# Patient Record
Sex: Male | Born: 1970
Health system: Southern US, Community
[De-identification: ages and names within clinical notes are randomized; demographics above are authoritative.]

## PROBLEM LIST (undated history)

## (undated) DIAGNOSIS — K8689 Other specified diseases of pancreas: Secondary | ICD-10-CM

## (undated) DIAGNOSIS — Z1379 Encounter for other screening for genetic and chromosomal anomalies: Secondary | ICD-10-CM

## (undated) DIAGNOSIS — I81 Portal vein thrombosis: Secondary | ICD-10-CM

## (undated) DIAGNOSIS — C259 Malignant neoplasm of pancreas, unspecified: Secondary | ICD-10-CM

## (undated) DIAGNOSIS — J45909 Unspecified asthma, uncomplicated: Secondary | ICD-10-CM

## (undated) HISTORY — DX: Malignant neoplasm of pancreas, unspecified: C25.9

## (undated) HISTORY — DX: Other specified diseases of pancreas: K86.89

## (undated) HISTORY — DX: Encounter for other screening for genetic and chromosomal anomalies: Z13.79

---

## 2017-01-29 ENCOUNTER — Encounter: Payer: Self-pay | Admitting: Emergency Medicine

## 2017-01-29 ENCOUNTER — Emergency Department
Admission: EM | Admit: 2017-01-29 | Discharge: 2017-01-29 | Disposition: A | Discharge status: Home / Self Care | Payer: No Typology Code available for payment source | Attending: Emergency Medicine | Admitting: Emergency Medicine

## 2017-01-29 ENCOUNTER — Emergency Department: Payer: No Typology Code available for payment source

## 2017-01-29 DIAGNOSIS — R1013 Epigastric pain: Secondary | ICD-10-CM | POA: Diagnosis present

## 2017-01-29 DIAGNOSIS — K859 Acute pancreatitis without necrosis or infection, unspecified: Secondary | ICD-10-CM | POA: Insufficient documentation

## 2017-01-29 DIAGNOSIS — R112 Nausea with vomiting, unspecified: Secondary | ICD-10-CM | POA: Diagnosis not present

## 2017-01-29 DIAGNOSIS — R748 Abnormal levels of other serum enzymes: Secondary | ICD-10-CM | POA: Insufficient documentation

## 2017-01-29 LAB — URINALYSIS, COMPLETE (UACMP) WITH MICROSCOPIC
Bacteria, UA: NONE SEEN
Bilirubin Urine: NEGATIVE
GLUCOSE, UA: NEGATIVE mg/dL
KETONES UR: 5 mg/dL — AB
Leukocytes, UA: NEGATIVE
Nitrite: NEGATIVE
PH: 5 (ref 5.0–8.0)
Protein, ur: 30 mg/dL — AB
SPECIFIC GRAVITY, URINE: 1.026 (ref 1.005–1.030)

## 2017-01-29 LAB — CBC
HCT: 43.2 % (ref 40.0–52.0)
Hemoglobin: 15.4 g/dL (ref 13.0–18.0)
MCH: 31.3 pg (ref 26.0–34.0)
MCHC: 35.6 g/dL (ref 32.0–36.0)
MCV: 87.9 fL (ref 80.0–100.0)
PLATELETS: 312 10*3/uL (ref 150–440)
RBC: 4.92 MIL/uL (ref 4.40–5.90)
RDW: 13.2 % (ref 11.5–14.5)
WBC: 7 10*3/uL (ref 3.8–10.6)

## 2017-01-29 LAB — COMPREHENSIVE METABOLIC PANEL
ALK PHOS: 57 U/L (ref 38–126)
ALT: 16 U/L — AB (ref 17–63)
AST: 20 U/L (ref 15–41)
Albumin: 4.1 g/dL (ref 3.5–5.0)
Anion gap: 6 (ref 5–15)
BUN: 9 mg/dL (ref 6–20)
CALCIUM: 9.5 mg/dL (ref 8.9–10.3)
CHLORIDE: 105 mmol/L (ref 101–111)
CO2: 26 mmol/L (ref 22–32)
CREATININE: 0.86 mg/dL (ref 0.61–1.24)
GFR calc Af Amer: >60 mL/min (ref >60)
GFR calc non Af Amer: >60 mL/min (ref >60)
Glucose, Bld: 96 mg/dL (ref 65–99)
Potassium: 3.7 mmol/L (ref 3.5–5.1)
Sodium: 137 mmol/L (ref 135–145)
Total Bilirubin: 1.2 mg/dL (ref 0.3–1.2)
Total Protein: 8.1 g/dL (ref 6.5–8.1)

## 2017-01-29 LAB — LIPASE, BLOOD: LIPASE: 127 U/L — AB (ref 11–51)

## 2017-01-29 MED ORDER — DOCUSATE SODIUM 100 MG PO CAPS: ORAL_CAPSULE | ORAL | 0 refills | Status: DC

## 2017-01-29 MED ORDER — ONDANSETRON 4 MG PO TBDP
4.0000 mg | ORAL_TABLET | Freq: Once | ORAL | Status: AC
  Administered 2017-01-29: 4 mg via ORAL
  Filled 2017-01-29: qty 1

## 2017-01-29 MED ORDER — HYDROCODONE-ACETAMINOPHEN 5-325 MG PO TABS
2.0000 | ORAL_TABLET | Freq: Once | ORAL | Status: AC
  Administered 2017-01-29: 2 via ORAL
  Filled 2017-01-29: qty 2

## 2017-01-29 MED ORDER — ONDANSETRON 4 MG PO TBDP: ORAL_TABLET | ORAL | 0 refills | Status: DC

## 2017-01-29 MED ORDER — HYDROCODONE-ACETAMINOPHEN 5-325 MG PO TABS: 1.0000 | ORAL_TABLET | Freq: Four times a day (QID) | ORAL | 0 refills | Status: DC | PRN

## 2017-01-29 NOTE — Discharge Instructions (Signed)
You have been seen in the Emergency Department (ED) for abdominal pain and nausea/vomiting over the last few weeks.  Your ultrasound was normal, but your labs suggest you have been suffering from mild pancreatitis.  Please follow up as instructed above regarding today?s emergent visit and the symptoms that are bothering you.  Read through the information about food choices for pancreatitis and gallbladder disease - it is very important that you avoid fatty/greasy foods, spicy foods, and alcohol.  Take Norco as prescribed for severe pain. Do not drink alcohol, drive or participate in any other potentially dangerous activities while taking this medication as it may make you sleepy. Do not take this medication with any other sedating medications, either prescription or over-the-counter. If you were prescribed Percocet or Vicodin, do not take these with acetaminophen (Tylenol) as it is already contained within these medications.   This medication is an opiate (or narcotic) pain medication and can be habit forming.  Use it as little as possible to achieve adequate pain control.  Do not use or use it with extreme caution if you have a history of opiate abuse or dependence.  If you are on a pain contract with your primary care doctor or a pain specialist, be sure to let them know you were prescribed this medication today from the [facility] Emergency Department.  This medication is intended for your use only - do not give any to anyone else and keep it in a secure place where nobody else, especially children, have access to it.  It will also cause or worsen constipation, so you may want to consider taking an over-the-counter stool softener while you are taking this medication.  Return to the ED if your abdominal pain worsens or fails to improve, you develop bloody vomiting, bloody diarrhea, you are unable to tolerate fluids due to vomiting, fever greater than 101, or other symptoms that concern you.

## 2017-01-29 NOTE — ED Triage Notes (Addendum)
Patient to ER stating "I think I have food poisoning. I haven't been able to keep anything down for two weeks". Patient also c/o epigastric pain.  Patient ambulatory to stat desk without difficulty and in no acute distress.

## 2017-01-29 NOTE — ED Provider Notes (Addendum)
[facility] Emergency Department Provider Note  ____________________________________________   First MD Initiated Contact with Patient 01/29/17 1158     (approximate)  I have reviewed the triage vital signs and the nursing notes.   HISTORY  Chief Complaint Emesis; Nausea; and Diarrhea    HPI Nathaniel Saunders is a 46 y.o. male with no reported past medical history who presents for evaluation of 2-3 weeks of persistent pain in his epigastrium with associated nausea and vomiting.  He states that he wonders if he got food poisoning because he has difficulty every time he eats.  He is well-appearing and in no acute distress but states that he does have persistent pain and indicates the epigastric region.he thinks he was told in the past that he had gallstones but he never had surgery.  He is ambulatory without difficulty and came in today because he is tired of it hurting and tired of the vomiting.  He denies drinking any alcohol and states that it is not really matter what he eats.  He denies fever/chills, chest pain, shortness of breath.  Nothing in particular makes the patient's symptoms better but eating definitely makes it worse.   History reviewed. No pertinent past medical history.  There are no active problems to display for this patient.   History reviewed. No pertinent surgical history.  Prior to Admission medications   Medication Sig Start Date End Date Taking? Authorizing Provider  docusate sodium (COLACE) 100 MG capsule Take 1 tablet once or twice daily as needed for constipation while taking narcotic pain medicine 01/29/17   Hinda Kehr, MD  HYDROcodone-acetaminophen (NORCO/VICODIN) 5-325 MG tablet Take 1-2 tablets by mouth every 6 (six) hours as needed for moderate pain. 01/29/17   Hinda Kehr, MD  ondansetron (ZOFRAN ODT) 4 MG disintegrating tablet Allow 1-2 tablets to dissolve in your mouth every 8 hours as needed for nausea/vomiting 01/29/17    Hinda Kehr, MD    Allergies Patient has no known allergies.  History reviewed. No pertinent family history.  Social History Social History  Substance Use Topics  . Smoking status: Never Smoker  . Smokeless tobacco: Never Used  . Alcohol use No    Review of Systems Constitutional: No fever/chills Eyes: No visual changes. ENT: No sore throat. Cardiovascular: Denies chest pain. Respiratory: Denies shortness of breath. Gastrointestinal: persistent abdominal pain for several weeks with nausea and vomiting, worse after eating Genitourinary: Negative for dysuria. Musculoskeletal: Negative for neck pain.  Negative for back pain. Integumentary: Negative for rash. Neurological: Negative for headaches, focal weakness or numbness.   ____________________________________________   PHYSICAL EXAM:  VITAL SIGNS: ED Triage Vitals  Enc Vitals Group     BP 01/29/17 1023 111/71     Pulse Rate 01/29/17 1023 71     Resp 01/29/17 1023 18     Temp 01/29/17 1023 (!) 97.2 F (36.2 C)     Temp Source 01/29/17 1023 Oral     SpO2 01/29/17 1023 98 %     Weight 01/29/17 1024 81.6 kg (180 lb)     Height --      Head Circumference --      Peak Flow --      Pain Score 01/29/17 1023 8     Pain Loc --      Pain Edu? --      Excl. in [place]? --     Constitutional: Alert and oriented. Well appearing and in no acute distress. Eyes: Conjunctivae are normal.  Head:  Atraumatic. Neck: No stridor.  No meningeal signs.   Cardiovascular: Normal rate, regular rhythm. Good peripheral circulation. Grossly normal heart sounds. Respiratory: Normal respiratory effort.  No retractions. Lungs CTAB. Gastrointestinal: Soft with mild tenderness to palpation of the epigastrium, minimal tenderness to palpation of the right upper quadrant.  No lower abdominal tenderness.  No rebound and no guarding Musculoskeletal: No lower extremity tenderness nor edema. No gross deformities of extremities. Neurologic:  Normal  speech and language. No gross focal neurologic deficits are appreciated.  Skin:  Skin is warm, dry and intact. No rash noted. Psychiatric: Mood and affect are normal. Speech and behavior are normal.  ____________________________________________   LABS (all labs ordered are listed, but only abnormal results are displayed)  Labs Reviewed  LIPASE, BLOOD - Abnormal; Notable for the following:       Result Value   Lipase 127 (*)    All other components within normal limits  COMPREHENSIVE METABOLIC PANEL - Abnormal; Notable for the following:    ALT 16 (*)    All other components within normal limits  URINALYSIS, COMPLETE (UACMP) WITH MICROSCOPIC - Abnormal; Notable for the following:    Color, Urine AMBER (*)    APPearance CLEAR (*)    Hgb urine dipstick MODERATE (*)    Ketones, ur 5 (*)    Protein, ur 30 (*)    Squamous Epithelial / LPF 0-5 (*)    All other components within normal limits  CBC   ____________________________________________  EKG  ED ECG REPORT I, Tammee Thielke, the attending physician, personally viewed and interpreted this ECG.  Date: 01/29/2017 EKG Time: 10:33 AM Rate: 63 Rhythm: normal sinus rhythm QRS Axis: normal Intervals: normal ST/T Wave abnormalities: normal Narrative Interpretation: no evidence of acute ischemia  ____________________________________________  RADIOLOGY   US Abdomen Limited Ruq  Result Date: 01/29/2017 CLINICAL DATA:  Right upper quadrant abdominal pain for several weeks. EXAM: ULTRASOUND ABDOMEN LIMITED RIGHT UPPER QUADRANT COMPARISON:  None. FINDINGS: Gallbladder: No gallstones or wall thickening visualized. No sonographic Murphy sign noted by sonographer. Common bile duct: Diameter: 5.4 mm which is within normal limits. Liver: No focal lesion identified. Within normal limits in parenchymal echogenicity. Portal vein is patent on color Doppler imaging with normal direction of blood flow towards the liver. IMPRESSION: No  abnormality seen in the right upper quadrant of the abdomen. Electronically Signed   By: Marijo Conception, M.D.   On: 01/29/2017 13:40    ____________________________________________   PROCEDURES  Critical Care performed: No   Procedure(s) performed:   Procedures   ____________________________________________   INITIAL IMPRESSION / ASSESSMENT AND PLAN / ED COURSE  Pertinent labs & imaging results that were available during my care of the patient were reviewed by me and considered in my medical decision making (see chart for details).  the patient is very well-appearing and does not seem to be in distress although he does report some persistent abdominal discomfort.  His vital signs are all within normal limits.  His lab results are normal including no leukocytosis, except for a mildly elevated lipase.  As result of the lipase and the description of his symptoms, I obtain an ultrasound of his right upper quadrant, but the radiologist's report indicates no acute abnormalities.  I reassessed the patient and he seems happy and comfortable at this time.  I explained to him the results of the ultrasound and try to explain the lipase and the significance of pancreatitis.  I counseled him about food choices and  avoiding alcohol which he states he does not drink anyway.  I explained to him that we could admit him to the hospital but given the length of the symptoms, it may be worth trying to use dietary changes and pain and nausea medicine to improve his symptoms.  He agrees with that and states he would rather not come into the hospital.  I gave him strict return precautions if he gets worse rather than improves.  He understands and agrees with the plan.  Clinical Course as of Jan 29 1417  Sat Jan 29, 2017  1417 given the duration of his symptoms and the fact that he has stable vitals and no leukocytosis, I do not feel he would benefit from a CT scan of the abdomen and pelvis at this time.  I  suspect he has been suffering from mild subacute or chronic pancreatitis for an extended period of time.  [CF]    Clinical Course User Index [CF] Hinda Kehr, MD    ____________________________________________  FINAL CLINICAL IMPRESSION(S) / ED DIAGNOSES  Final diagnoses:  Acute pancreatitis, unspecified complication status, unspecified pancreatitis type  Elevated lipase     MEDICATIONS GIVEN DURING THIS VISIT:  Medications  HYDROcodone-acetaminophen (NORCO/VICODIN) 5-325 MG per tablet 2 tablet (not administered)  ondansetron (ZOFRAN-ODT) disintegrating tablet 4 mg (not administered)  ondansetron (ZOFRAN-ODT) disintegrating tablet 4 mg (4 mg Oral Given 01/29/17 1215)     NEW OUTPATIENT MEDICATIONS STARTED DURING THIS VISIT:  New Prescriptions   DOCUSATE SODIUM (COLACE) 100 MG CAPSULE    Take 1 tablet once or twice daily as needed for constipation while taking narcotic pain medicine   HYDROCODONE-ACETAMINOPHEN (NORCO/VICODIN) 5-325 MG TABLET    Take 1-2 tablets by mouth every 6 (six) hours as needed for moderate pain.   ONDANSETRON (ZOFRAN ODT) 4 MG DISINTEGRATING TABLET    Allow 1-2 tablets to dissolve in your mouth every 8 hours as needed for nausea/vomiting    Modified Medications   No medications on file    Discontinued Medications   No medications on file     Note:  This document was prepared using Dragon voice recognition software and may include unintentional dictation errors.    Hinda Kehr, MD 01/29/17 1418    Hinda Kehr, MD 01/29/17 [phone]

## 2017-01-29 NOTE — ED Triage Notes (Signed)
Pt to ed with c/o abd pain and n/v/d x 2 weeks.

## 2017-03-23 ENCOUNTER — Telehealth: Payer: Self-pay | Admitting: Internal Medicine

## 2017-03-23 ENCOUNTER — Emergency Department
Admission: EM | Admit: 2017-03-23 | Discharge: 2017-03-23 | Disposition: A | Discharge status: Home / Self Care | Payer: No Typology Code available for payment source | Attending: Emergency Medicine | Admitting: Emergency Medicine

## 2017-03-23 ENCOUNTER — Emergency Department: Payer: No Typology Code available for payment source

## 2017-03-23 ENCOUNTER — Encounter: Payer: Self-pay | Admitting: Emergency Medicine

## 2017-03-23 ENCOUNTER — Other Ambulatory Visit: Payer: Self-pay

## 2017-03-23 DIAGNOSIS — R1013 Epigastric pain: Secondary | ICD-10-CM | POA: Diagnosis present

## 2017-03-23 DIAGNOSIS — Z87891 Personal history of nicotine dependence: Secondary | ICD-10-CM | POA: Diagnosis not present

## 2017-03-23 DIAGNOSIS — C259 Malignant neoplasm of pancreas, unspecified: Secondary | ICD-10-CM | POA: Diagnosis not present

## 2017-03-23 LAB — URINALYSIS, COMPLETE (UACMP) WITH MICROSCOPIC
BILIRUBIN URINE: NEGATIVE
GLUCOSE, UA: NEGATIVE mg/dL
Ketones, ur: 20 mg/dL — AB
Leukocytes, UA: NEGATIVE
NITRITE: NEGATIVE
PH: 6 (ref 5.0–8.0)
Protein, ur: 30 mg/dL — AB
RBC / HPF: NONE SEEN RBC/hpf (ref 0–5)
SPECIFIC GRAVITY, URINE: 1.018 (ref 1.005–1.030)
Squamous Epithelial / LPF: NONE SEEN

## 2017-03-23 LAB — COMPREHENSIVE METABOLIC PANEL
ALK PHOS: 48 U/L (ref 38–126)
ALT: 13 U/L — ABNORMAL LOW (ref 17–63)
ANION GAP: 10 (ref 5–15)
AST: 22 U/L (ref 15–41)
Albumin: 4.2 g/dL (ref 3.5–5.0)
BILIRUBIN TOTAL: 1.3 mg/dL — AB (ref 0.3–1.2)
BUN: 8 mg/dL (ref 6–20)
CALCIUM: 9.5 mg/dL (ref 8.9–10.3)
CO2: 25 mmol/L (ref 22–32)
Chloride: 103 mmol/L (ref 101–111)
Creatinine, Ser: 1 mg/dL (ref 0.61–1.24)
GFR calc Af Amer: >60 mL/min (ref >60)
Glucose, Bld: 94 mg/dL (ref 65–99)
POTASSIUM: 3.6 mmol/L (ref 3.5–5.1)
Sodium: 138 mmol/L (ref 135–145)
TOTAL PROTEIN: 8.2 g/dL — AB (ref 6.5–8.1)

## 2017-03-23 LAB — CBC
HEMATOCRIT: 44.3 % (ref 40.0–52.0)
Hemoglobin: 14.9 g/dL (ref 13.0–18.0)
MCH: 30.2 pg (ref 26.0–34.0)
MCHC: 33.7 g/dL (ref 32.0–36.0)
MCV: 89.7 fL (ref 80.0–100.0)
PLATELETS: 276 10*3/uL (ref 150–440)
RBC: 4.93 MIL/uL (ref 4.40–5.90)
RDW: 13.3 % (ref 11.5–14.5)
WBC: 5.9 10*3/uL (ref 3.8–10.6)

## 2017-03-23 LAB — LIPASE, BLOOD: Lipase: 30 U/L (ref 11–51)

## 2017-03-23 LAB — TROPONIN I: Troponin I: <0.03 ng/mL (ref <0.03)

## 2017-03-23 MED ORDER — FAMOTIDINE IN NACL 20-0.9 MG/50ML-% IV SOLN
20.0000 mg | Freq: Once | INTRAVENOUS | Status: AC
  Administered 2017-03-23: 20 mg via INTRAVENOUS
  Filled 2017-03-23: qty 50

## 2017-03-23 MED ORDER — ACETAMINOPHEN 500 MG PO TABS
1000.0000 mg | ORAL_TABLET | Freq: Once | ORAL | Status: AC
  Administered 2017-03-23: 1000 mg via ORAL
  Filled 2017-03-23: qty 2

## 2017-03-23 MED ORDER — OXYCODONE-ACETAMINOPHEN 5-325 MG PO TABS
1.0000 | ORAL_TABLET | Freq: Four times a day (QID) | ORAL | 0 refills | Status: DC | PRN
Start: 2017-03-23 — End: 2017-03-24

## 2017-03-23 MED ORDER — ONDANSETRON HCL 4 MG/2ML IJ SOLN
4.0000 mg | Freq: Once | INTRAMUSCULAR | Status: AC
  Administered 2017-03-23: 4 mg via INTRAVENOUS
  Filled 2017-03-23: qty 2

## 2017-03-23 MED ORDER — IOPAMIDOL (ISOVUE-300) INJECTION 61%
100.0000 mL | Freq: Once | INTRAVENOUS | Status: AC | PRN
  Administered 2017-03-23: 100 mL via INTRAVENOUS

## 2017-03-23 NOTE — ED Notes (Addendum)
Epigastric pain after eating for the last month. Nausea. Denies vomiting and diarrhea. EDP at bedside. No pain at this time.  Pt alert and oriented X4, active, cooperative, pt in NAD. RR even and unlabored, color WNL.

## 2017-03-23 NOTE — Telephone Encounter (Signed)
Added for radiology review at tumor board 11/1

## 2017-03-23 NOTE — ED Notes (Signed)
ED Provider at bedside. 

## 2017-03-23 NOTE — Telephone Encounter (Signed)
Consultation for Pancreatic Mass. Ref from ER (per schd msg/Dr B) Per Dr Rogue Bussing, see MD on 03/24/17 @ 2:30 p.m. MF (added for CD)

## 2017-03-23 NOTE — ED Provider Notes (Signed)
[facility] Emergency Department Provider Note  ____________________________________________  Time seen: Approximately 10:59 AM  I have reviewed the triage vital signs and the nursing notes.   HISTORY  Chief Complaint Abdominal Pain   HPI Nathaniel Saunders is a 46 y.o. male with no significantpast medical history who presents for evaluation of abdominal pain. Patient reports that he has had this pain for 2 months. The pain is sharp, located in the epigastric region, usually occurs after he eats and lasts for about 5 hours. He reports that he feels a bad taste in his mouth during these 5 hours. Yesterday had a more severe episode which was associated with one episode of emesis. He went to [facility] emergency department however was unable to be seen due to the wait so then came here for further evaluation. Patient denies melena, hematemesis or coffee-ground emesis. At this time has 7 out of 10 pain. The pain is nonradiating. He denies fever or chills, he denies NSAID use, he denies alcohol use. No personal or family history of heart disease, he is not a smoker.  History reviewed. No pertinent past medical history.  There are no active problems to display for this patient.   History reviewed. No pertinent surgical history.  Prior to Admission medications   Medication Sig Start Date End Date Taking? Authorizing Provider  docusate sodium (COLACE) 100 MG capsule Take 1 tablet once or twice daily as needed for constipation while taking narcotic pain medicine 01/29/17   Hinda Kehr, MD  HYDROcodone-acetaminophen (NORCO/VICODIN) 5-325 MG tablet Take 1-2 tablets by mouth every 6 (six) hours as needed for moderate pain. 01/29/17   Hinda Kehr, MD  ondansetron (ZOFRAN ODT) 4 MG disintegrating tablet Allow 1-2 tablets to dissolve in your mouth every 8 hours as needed for nausea/vomiting 01/29/17   Hinda Kehr, MD  oxyCODONE-acetaminophen (ROXICET) 5-325 MG tablet Take 1  tablet by mouth every 6 (six) hours as needed. 03/23/17 03/23/18  Rudene Re, MD    Allergies Patient has no known allergies.  No family history on file.  Social History Social History  Substance Use Topics  . Smoking status: Former Research scientist (life sciences)  . Smokeless tobacco: Never Used  . Alcohol use No    Review of Systems  Constitutional: Negative for fever. Eyes: Negative for visual changes. ENT: Negative for sore throat. Neck: No neck pain  Cardiovascular: Negative for chest pain. Respiratory: Negative for shortness of breath. Gastrointestinal: + epigastric abdominal pain, nausea, and vomiting. No diarrhea. Genitourinary: Negative for dysuria. Musculoskeletal: Negative for back pain. Skin: Negative for rash. Neurological: Negative for headaches, weakness or numbness. Psych: No SI or HI  ____________________________________________   PHYSICAL EXAM:  VITAL SIGNS: ED Triage Vitals  Enc Vitals Group     BP 03/23/17 0920 125/76     Pulse Rate 03/23/17 0920 (!) 104     Resp 03/23/17 0920 18     Temp 03/23/17 0920 (!) 97.5 F (36.4 C)     Temp Source 03/23/17 0920 Oral     SpO2 03/23/17 0920 99 %     Weight 03/23/17 0921 147 lb (66.7 kg)     Height 03/23/17 0921 5\' 3"  (1.6 m)     Head Circumference --      Peak Flow --      Pain Score 03/23/17 0920 8     Pain Loc --      Pain Edu? --      Excl. in Pink? --     Constitutional:  Alert and oriented. Well appearing and in no apparent distress. HEENT:      Head: Normocephalic and atraumatic.         Eyes: Conjunctivae are normal. Sclera is non-icteric.       Mouth/Throat: Mucous membranes are moist.       Neck: Supple with no signs of meningismus. Cardiovascular: Regular rate and rhythm. No murmurs, gallops, or rubs. 2+ symmetrical distal pulses are present in all extremities. No JVD. Respiratory: Normal respiratory effort. Lungs are clear to auscultation bilaterally. No wheezes, crackles, or rhonchi.  Gastrointestinal:  Soft, ttp over the epigastric region and L quadrants, and non distended with positive bowel sounds. No rebound or guarding. Musculoskeletal: Nontender with normal range of motion in all extremities. No edema, cyanosis, or erythema of extremities. Neurologic: Normal speech and language. Face is symmetric. Moving all extremities. No gross focal neurologic deficits are appreciated. Skin: Skin is warm, dry and intact. No rash noted. Psychiatric: Mood and affect are normal. Speech and behavior are normal.  ____________________________________________   LABS (all labs ordered are listed, but only abnormal results are displayed)  Labs Reviewed  COMPREHENSIVE METABOLIC PANEL - Abnormal; Notable for the following:       Result Value   Total Protein 8.2 (*)    ALT 13 (*)    Total Bilirubin 1.3 (*)    All other components within normal limits  URINALYSIS, COMPLETE (UACMP) WITH MICROSCOPIC - Abnormal; Notable for the following:    Color, Urine YELLOW (*)    APPearance CLEAR (*)    Hgb urine dipstick MODERATE (*)    Ketones, ur 20 (*)    Protein, ur 30 (*)    Bacteria, UA RARE (*)    All other components within normal limits  URINE CULTURE  LIPASE, BLOOD  CBC  TROPONIN I   ____________________________________________  EKG  ED ECG REPORT I, Rudene Re, the attending physician, personally viewed and interpreted this ECG.  Normal sinus rhythm, rate of 16, normal intervals, normal axis, LVH, no ST elevation or depression. unchanged from prior from a 01/2017 ____________________________________________  RADIOLOGY  CT a/p: 2.6 cm low-attenuation mass in the pancreatic neck causing pancreatic ductal dilatation and encasing and narrowing the proximal main portal vein, highly suspicious for pancreatic adenocarcinoma.  Smaller 1.5 cm .ow-attenuation lesion in the pancreatic tail measuring 1.5 cm, which may also be due pancreatic neoplasm.  No evidence of metastatic disease within the  abdomen or pelvis.  Incidental findings which include mildly enlarged prostate, and aortic atherosclerosis. ____________________________________________   PROCEDURES  Procedure(s) performed: None Procedures Critical Care performed:  None ____________________________________________   INITIAL IMPRESSION / ASSESSMENT AND PLAN / ED COURSE  46 y.o. male with no significantpast medical history who presents for evaluation of post prandial abdominal pain x 2 months. patient was seen here a month ago with similar complaint and had slightly elevated lipase with a right upper quadrant ultrasound showing no abnormalities in the gallbladder and no stones. Patient continues to have pain and is currently tender to palpation on the epigastric and left quadrants with no rebound or guarding. He is otherwise well appearing. Differential diagnosis including gastritis versus peptic ulcer disease versus GERD versus diverticulitis. Labs at this time show normal CBC, CMP and lipase. Will get CT a/p, will give pepcid, zofran, and IVF for symptom relief. If workup negative will start patient on pepcid and refer to GI    _________________________ 1:00 PM on 03/23/2017 -----------------------------------------  unfortunately patient's CT scan is concerning for  pancreatic cancer. Patient tells me that he has a very strong family history of pancreatic cancer. I have spoken with Dr. Rogue Bussing who will ensure close outpatient follow-up. Patient be provided with a prescription for pain medicine. Discussed return precautions with patient   As part of my medical decision making, I reviewed the following data within the [place] notes reviewed and incorporated, Labs reviewed , EKG interpreted , Old EKG reviewed, Old chart reviewed, Radiograph reviewed , A consult was requested and obtained from this/these consultant(s) Oncology, Notes from prior ED visits and [place] Controlled Substance  Database    Pertinent labs & imaging results that were available during my care of the patient were reviewed by me and considered in my medical decision making (see chart for details).    ____________________________________________   FINAL CLINICAL IMPRESSION(S) / ED DIAGNOSES  Final diagnoses:  Malignant neoplasm of pancreas, unspecified location of malignancy ([place])      NEW MEDICATIONS STARTED DURING THIS VISIT:  New Prescriptions   OXYCODONE-ACETAMINOPHEN (ROXICET) 5-325 MG TABLET    Take 1 tablet by mouth every 6 (six) hours as needed.     Note:  This document was prepared using Dragon voice recognition software and may include unintentional dictation errors.    Rudene Re, MD 03/23/17 [phone]

## 2017-03-23 NOTE — ED Triage Notes (Signed)
Epigastric pain after eating x 2 months.

## 2017-03-23 NOTE — ED Notes (Signed)
Patient transported to CT 

## 2017-03-23 NOTE — Telephone Encounter (Signed)
Got a call from ER Doc re: pancreatic mass.  Will see pt tomorrow Nov 1st at 2:30 pm. Please schedule.  kristi- please add pts CT scan to tumor conference for tomorrow.

## 2017-03-23 NOTE — ED Notes (Signed)
Pt alert and oriented X4, active, cooperative, pt in NAD. RR even and unlabored, color WNL.  Pt informed to return if any life threatening symptoms occur.  Discharge and followup instructions reviewed.  

## 2017-03-24 ENCOUNTER — Telehealth: Payer: Self-pay | Admitting: Internal Medicine

## 2017-03-24 ENCOUNTER — Other Ambulatory Visit: Payer: Self-pay

## 2017-03-24 ENCOUNTER — Inpatient Hospital Stay: Payer: No Typology Code available for payment source

## 2017-03-24 ENCOUNTER — Inpatient Hospital Stay: Payer: No Typology Code available for payment source | Attending: Internal Medicine | Admitting: Internal Medicine

## 2017-03-24 ENCOUNTER — Encounter: Payer: Self-pay | Admitting: Internal Medicine

## 2017-03-24 DIAGNOSIS — J45909 Unspecified asthma, uncomplicated: Secondary | ICD-10-CM | POA: Insufficient documentation

## 2017-03-24 DIAGNOSIS — C251 Malignant neoplasm of body of pancreas: Secondary | ICD-10-CM | POA: Diagnosis not present

## 2017-03-24 DIAGNOSIS — R112 Nausea with vomiting, unspecified: Secondary | ICD-10-CM | POA: Diagnosis not present

## 2017-03-24 DIAGNOSIS — Z79899 Other long term (current) drug therapy: Secondary | ICD-10-CM | POA: Diagnosis not present

## 2017-03-24 DIAGNOSIS — I700 Atherosclerosis of aorta: Secondary | ICD-10-CM | POA: Diagnosis not present

## 2017-03-24 DIAGNOSIS — G893 Neoplasm related pain (acute) (chronic): Secondary | ICD-10-CM | POA: Diagnosis not present

## 2017-03-24 DIAGNOSIS — R634 Abnormal weight loss: Secondary | ICD-10-CM | POA: Insufficient documentation

## 2017-03-24 DIAGNOSIS — N400 Enlarged prostate without lower urinary tract symptoms: Secondary | ICD-10-CM

## 2017-03-24 DIAGNOSIS — Z87891 Personal history of nicotine dependence: Secondary | ICD-10-CM | POA: Insufficient documentation

## 2017-03-24 DIAGNOSIS — Z800 Family history of malignant neoplasm of digestive organs: Secondary | ICD-10-CM | POA: Diagnosis not present

## 2017-03-24 LAB — PROTIME-INR
INR: 1
Prothrombin Time: 13.1 seconds (ref 11.4–15.2)

## 2017-03-24 LAB — C-REACTIVE PROTEIN: CRP: <0.8 mg/dL (ref <1.0)

## 2017-03-24 LAB — HEPATIC FUNCTION PANEL
ALK PHOS: 54 U/L (ref 38–126)
ALT: 13 U/L — AB (ref 17–63)
AST: 21 U/L (ref 15–41)
Albumin: 4.2 g/dL (ref 3.5–5.0)
BILIRUBIN INDIRECT: 0.5 mg/dL (ref 0.3–0.9)
BILIRUBIN TOTAL: 0.6 mg/dL (ref 0.3–1.2)
Bilirubin, Direct: 0.1 mg/dL (ref 0.1–0.5)
TOTAL PROTEIN: 8 g/dL (ref 6.5–8.1)

## 2017-03-24 LAB — URINE CULTURE: Culture: NO GROWTH

## 2017-03-24 LAB — APTT: aPTT: 31 seconds (ref 24–36)

## 2017-03-24 MED ORDER — OXYCODONE-ACETAMINOPHEN 5-325 MG PO TABS: ORAL_TABLET | ORAL | 0 refills | Status: DC

## 2017-03-24 MED ORDER — FENTANYL 25 MCG/HR TD PT72: 25.0000 ug | MEDICATED_PATCH | TRANSDERMAL | 0 refills | Status: DC

## 2017-03-24 NOTE — Progress Notes (Signed)
Ref by ED for work up for pancreatic mass. Patient reports abd pain (LUQ/epigastric pain that radiates to mid back"/ fullness/bloating and intermittent episodes of nausea. Pt reports 40+ lb unintentional wt loss x 2-3 months. He eats 3 meals/day but "often feels fullness after eating." Patient exercises and eats healthy meals. He works as a Database administrator. He has a good social support and lives with girlfriend "Atachia." He states that both his mother & mat. grandmother died of pancreatic cancer.

## 2017-03-24 NOTE — Telephone Encounter (Signed)
Spoke to pt at [phone]/ cell, he agrees with me speaking to his employer- regarding his possible diagnosis and is current treatment plan.    Received a call from patient's employer [phone]; [employer] [place]. Inform the patient's possible diagnosis/need for narcotics.

## 2017-03-24 NOTE — Progress Notes (Signed)
  Oncology Nurse Navigator Documentation Met with Nathaniel Saunders before and after consult with Dr. Rogue Bussing. Introduced Therapist, nutritional and provided contact information for any future needs. EUS arranged for 11/8 with Dr. Cephas Darby at [facility] for biopsy. Went over instructions and copy given to him along with further education regarding EUS. CT pancreatic protocol scheduled for 11/9 at [facility]. Reviewed location and prep for exam. He will follow up with Dr. Rogue Bussing for results in [facility] Tuesday 11/13. This appt also reviewed with him. Labs drawn today. He was encouraged to call with any questions regarding plan of care and appointments.  INSTRUCTIONS FOR ENDOSCOPIC ULTRASOUND -Your procedure has been scheduled for November 8th with Dr. Cephas Darby at [facility]. -The hospital may contact you to pre-register over the phone.  -To get your scheduled arrival time, please call the Endoscopy unit at  [phone] between 1-3 p.m. on:  October 7th   -ON THE DAY OF YOU PROCEDURE:   1. If you are scheduled for a morning procedure, nothing to drink after midnight  -If you are scheduled for an afternoon procedure, you may have clear liquids until 5 hours prior  to the procedure but no carbonated drinks or broth  2. NO FOOD THE DAY OF YOUR PROCEDURE  3. You may take your heart, seizure, blood pressure, Parkinson's or breathing medications at  6am with just enough water to get your pills down  4. Do not take any oral Diabetic medications the morning of your procedure.  5. If you are a diabetic and are using insulin, please notify your prescribing physician of this  procedure as your dose may need to be altered related to not being able to eat or drink.   5. Do not take vitamins, iron, or fish oil for 5 days before your procedure     -On the day of your procedure, come to the [facility] Admitting/Registration desk (First desk on the right) at the scheduled arrival time. You MUST  have someone drive you home from your procedure. You must have a responsible adult with a valid driver's license who is on site throughout your entire procedure and who can stay with you for several hours after your procedure. You may not go home alone in a taxi, shuttle [place] or bus, as the drivers will not be responsible for you.  --If you have any questions please call me at the above contact    Navigator Location: [facility] Onc (03/24/17 1600)   )Navigator Encounter Type: Initial MedOnc (03/24/17 1600)   Abnormal Finding Date: 03/23/17 (03/24/17 1600)                 Patient Visit Type: MedOnc;Initial (03/24/17 1600) Treatment Phase: Abnormal Scans (03/24/17 1600) Barriers/Navigation Needs: Coordination of Care;Education (03/24/17 1600)   Interventions: Education;Coordination of Care (03/24/17 1600)   Coordination of Care: EUS (03/24/17 1600) Education Method: Teach-back;Verbal;Written (03/24/17 1600)      Acuity: Level 2 (03/24/17 1600)   Acuity Level 2: Initial guidance, education and coordination as needed;Educational needs;Assistance expediting appointments;Ongoing guidance and education throughout treatment as needed (03/24/17 1600)     Time Spent with Patient: 60 (03/24/17 1600)

## 2017-03-24 NOTE — Progress Notes (Signed)
[place] CONSULT NOTE  Patient Care Team: Patient, No Pcp Per as PCP - General (General Practice) Clent Jacks, RN as Registered Nurse  CHIEF COMPLAINTS/PURPOSE OF CONSULTATION: Pancreatic mass  #  Oncology History   # November 2018- pancreatic neck mass [~2.5cm]; smaller pancreatic tail mass.    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  male with no significant medical history except for mild asthma- is currently being evaluated for abdominal pain/pancreatic mass.  Patient states that she has been having ongoing worsening abdominal pain in the epigastric region- for the last 2 months. This gets worse with eating. It radiates to the back. At worst- he states his pain is 9 as scale of 10. He has lost about 40 pounds. He has couple of episodes of nausea vomiting.    Patient was recently evaluated in the emergency room- that showed a 2.5 cm pancreatic neck mass; smaller pancreatic tail mass. The pancreatic mass seems to involve the venous vasculature. LFTs within normal limits.  Patient does have a family history of pancreatic cancer- mother/maternal grandmother. No history of breast cancer ovarian cancer.  ROS: A complete 10 point review of system is done which is negative except mentioned above in history of present illness  MEDICAL HISTORY:  Asthma [well controlled] Past Medical History:  Diagnosis Date  . Pancreatic mass     SURGICAL HISTORY: History reviewed. No pertinent surgical history.  SOCIAL HISTORY:  he lives in [place]; sister/girl friend; he has children aged  18/18/ 6 years;  quit smkoing - 5 months; alcohol- none; truck driver. Social History   Socioeconomic History  . Marital status: Single    Spouse name: Not on file  . Number of children: Not on  file  . Years of education: Not on file  . Highest education level: Not on file  Social Needs  . Financial resource strain: Not on file  . Food insecurity - worry: Not on file  . Food insecurity - inability: Not on file  . Transportation needs - medical: Not on file  . Transportation needs - non-medical: Not on file  Occupational History  . Not on file  Tobacco Use  . Smoking status: Former Smoker    Packs/day: 0.25    Years: 5.00    Pack years: 1.25    Types: Cigarettes  . Smokeless tobacco: Never Used  Substance and Sexual Activity  . Alcohol use: No  . Drug use: No  . Sexual activity: Not on file  Other Topics Concern  . Not on file  Social History Narrative  . Not on file    FAMILY HISTORY: Family History  Problem Relation Age of Onset  . Pancreatic cancer Mother 48  . Pancreatic cancer Maternal Grandmother 60    ALLERGIES:  has No Known Allergies.  MEDICATIONS:  Current Outpatient Medications  Medication Sig Dispense Refill  . docusate sodium (COLACE) 100 MG capsule Take 1 tablet once or twice daily as needed for constipation while taking narcotic pain medicine 30 capsule 0  . ondansetron (ZOFRAN ODT) 4 MG disintegrating tablet Allow 1-2 tablets to dissolve in your mouth every 8 hours as needed for nausea/vomiting 30 tablet 0  . oxyCODONE-acetaminophen (ROXICET) 5-325 MG tablet 6- 8 hours  as needed for pain 45 tablet 0  . fentaNYL (DURAGESIC - DOSED MCG/HR) 25 MCG/HR patch Place 1 patch (25 mcg total) onto the skin every 3 (three) days. 5 patch 0   No current facility-administered medications for this visit.       Marland Kitchen  PHYSICAL EXAMINATION: ECOG PERFORMANCE STATUS: 1 - Symptomatic but completely ambulatory  Vitals:   03/24/17 1447  BP: 137/80  Pulse: 70  Resp: 20  Temp: 97.8 F (36.6 C)   Filed Weights   03/24/17 1447  Weight: 144 lb (65.3 kg)    GENERAL: Well-nourished well-developed; Alert, no distress and comfortable.   Alone. EYES: no pallor  or icterus OROPHARYNX: no thrush or ulceration; poor dentition  NECK: supple, no masses felt LYMPH:  no palpable lymphadenopathy in the cervical, axillary or inguinal regions LUNGS: clear to auscultation and  No wheeze or crackles HEART/CVS: regular rate & rhythm and no murmurs; No lower extremity edema ABDOMEN: abdomen soft, non-tender and normal bowel sounds Musculoskeletal:no cyanosis of digits and no clubbing  PSYCH: alert & oriented x 3 with fluent speech NEURO: no focal motor/sensory deficits SKIN:  no rashes or significant lesions  LABORATORY DATA:  I have reviewed the data as listed Lab Results  Component Value Date   WBC 5.9 03/23/2017   HGB 14.9 03/23/2017   HCT 44.3 03/23/2017   MCV 89.7 03/23/2017   PLT 276 03/23/2017   Recent Labs    01/29/17 1025 03/23/17 0919 03/24/17 1537  NA 137 138  --   K 3.7 3.6  --   CL 105 103  --   CO2 26 25  --   GLUCOSE 96 94  --   BUN 9 8  --   CREATININE 0.86 1.00  --   CALCIUM 9.5 9.5  --   GFRNONAA >60 >60  --   GFRAA >60 >60  --   PROT 8.1 8.2* 8.0  ALBUMIN 4.1 4.2 4.2  AST 20 22 21   ALT 16* 13* 13*  ALKPHOS 57 48 54  BILITOT 1.2 1.3* 0.6  BILIDIR  --   --  0.1  IBILI  --   --  0.5    RADIOGRAPHIC STUDIES: I have personally reviewed the radiological images as listed and agreed with the findings in the report. Ct Abdomen Pelvis W Contrast  Result Date: 03/23/2017 CLINICAL DATA:  Postprandial epigastric and left-sided abdominal pain for 1 month. Nausea. EXAM: CT ABDOMEN AND PELVIS WITH CONTRAST TECHNIQUE: Multidetector CT imaging of the abdomen and pelvis was performed using the standard protocol following bolus administration of intravenous contrast. CONTRAST:  133mL ISOVUE-300 IOPAMIDOL (ISOVUE-300) INJECTION 61% COMPARISON:  None. FINDINGS: Lower Chest: No acute findings. Pleural-parenchymal scarring in both lung bases. Hepatobiliary: No hepatic masses identified. Gallbladder is unremarkable. Pancreas: Pancreatic  ductal dilatation is seen in the body and tail. A focal ill-defined low-attenuation lesion is seen in the pancreatic neck at site of pancreatic duct stricture which measures 2.6 x 2.0 cm on image 22/2. This encases and narrows the proximal main portal vein common is highly suspicious for carcinoma. A second smaller ill-defined low-attenuation area seen in the pancreatic tail measuring 1.5 cm on image 16/2, which may also represent pancreatic neoplasm. Spleen: Within normal limits in size and appearance. Adrenals/Urinary Tract: No masses identified. Small right renal cyst noted. No evidence of hydronephrosis. Mild diffuse wall thickening of urinary bladder may be due to chronic bladder outlet obstruction or cystitis. Stomach/Bowel: No evidence of obstruction, inflammatory process or abnormal  fluid collections. Vascular/Lymphatic: No pathologically enlarged lymph nodes. No abdominal aortic aneurysm. Aortic atherosclerosis. Reproductive:  Mildly enlarged prostate. Other:  None. Musculoskeletal:  No suspicious bone lesions identified. IMPRESSION: 2.6 cm low-attenuation mass in the pancreatic neck causing pancreatic ductal dilatation and encasing and narrowing the proximal main portal vein, highly suspicious for pancreatic adenocarcinoma. Smaller 1.5 cm .ow-attenuation lesion in the pancreatic tail measuring 1.5 cm, which may also be due pancreatic neoplasm. No evidence of metastatic disease within the abdomen or pelvis. Incidental findings which include mildly enlarged prostate, and aortic atherosclerosis. Electronically Signed   By: Earle Gell M.D.   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 No evidence of lymphadenopathy or distant metastatic disease. Imaging reviewed at the tumor conference; recommended pancreas protocol CT scan for further evaluation of the vasculature. Recommend  EUS/ for biopsy and staging purposes.  # If in the absence of any distant metastatic disease- given the high possibility of borderline resectable pancreatic cancer [based on portal vein involvement; awaiting further imaging; EUS]- I would recommend aggressive approach with FOLFIRINOX chemotherapy; with evaluation for surgical resection.  # Genetic testing- discussed regarding genetic testing given the diagnosis of pancreatic cancer in mother; grandmother the patient's young age. He is interested.  # Pain control- poorly controlled. Recommend fentanyl patch 25 g and also Percocet. New prescriptions skin. Discussed the potential side effects-including nausea; constipation dizziness; issues with the driving.  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  # Patient will follow-up with me in few days after the biopsy/CT scan. Today with LFTs and CA-19-9. PT/PTT.  Thank you for allowing me to participate in the care of your pleasant patient. Please do not hesitate to contact me with questions or concerns in the interim.Discussed with Nurse Navigator, Mathis Fare.    # I reviewed the blood work- with the patient in detail; also reviewed the imaging independently [as summarized above]; and with the patient in detail.   All questions were answered. The patient knows to call the clinic with any problems, questions or concerns.    Cammie Sickle, MD 03/27/2017 11:51 AM

## 2017-03-27 NOTE — Assessment & Plan Note (Signed)
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 No evidence of lymphadenopathy or distant metastatic disease. Imaging reviewed at the tumor conference; recommended pancreas protocol CT scan for further evaluation of the vasculature. Recommend EUS/ for biopsy and staging purposes.  # If in the absence of any distant metastatic disease- given the high possibility of borderline resectable pancreatic cancer [based on portal vein involvement; awaiting further imaging; EUS]- I would recommend aggressive approach with FOLFIRINOX chemotherapy; with evaluation for surgical resection.  # Genetic testing- discussed regarding genetic testing given the diagnosis of pancreatic cancer in mother; grandmother the patient's young age. He is interested.  # Pain control- poorly controlled. Recommend fentanyl patch 25 g and also Percocet. New prescriptions skin. Discussed the potential side effects-including nausea; constipation dizziness; issues with the driving.  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  # Patient will follow-up with me in few days after the biopsy/CT scan. Today with LFTs and CA-19-9. PT/PTT.  Thank you for allowing me to participate in the care of your pleasant patient. Please do not hesitate to contact me with questions or concerns in the interim.Discussed with Nurse Navigator, Mathis Fare.

## 2017-03-28 ENCOUNTER — Other Ambulatory Visit: Payer: Self-pay

## 2017-03-30 ENCOUNTER — Encounter: Payer: Self-pay | Admitting: *Deleted

## 2017-03-31 ENCOUNTER — Telehealth: Payer: Self-pay

## 2017-03-31 ENCOUNTER — Ambulatory Visit: Admission: RE | Admit: 2017-03-31 | Payer: No Typology Code available for payment source | Source: Ambulatory Visit

## 2017-03-31 ENCOUNTER — Other Ambulatory Visit: Payer: Self-pay | Admitting: Gastroenterology

## 2017-03-31 ENCOUNTER — Inpatient Hospital Stay: Payer: No Typology Code available for payment source

## 2017-03-31 ENCOUNTER — Encounter: Admission: RE | Payer: Self-pay | Source: Ambulatory Visit

## 2017-03-31 HISTORY — DX: Unspecified asthma, uncomplicated: J45.909

## 2017-03-31 SURGERY — ULTRASOUND, UPPER GI TRACT, ENDOSCOPIC
Anesthesia: Choice

## 2017-03-31 MED ORDER — PROPOFOL 500 MG/50ML IV EMUL
INTRAVENOUS | Status: AC
  Filled 2017-03-31: qty 50

## 2017-03-31 MED ORDER — LIDOCAINE HCL (PF) 2 % IJ SOLN
INTRAMUSCULAR | Status: AC
  Filled 2017-03-31: qty 10

## 2017-03-31 MED ORDER — FENTANYL CITRATE (PF) 100 MCG/2ML IJ SOLN
INTRAMUSCULAR | Status: AC
  Filled 2017-03-31: qty 2

## 2017-03-31 MED ORDER — MIDAZOLAM HCL 2 MG/2ML IJ SOLN
INTRAMUSCULAR | Status: AC
  Filled 2017-03-31: qty 2

## 2017-03-31 NOTE — Telephone Encounter (Signed)
  Oncology Nurse Navigator Documentation Mr. Denz did not show for his EUS today. Dr. Cephas Darby can work him in one day next week at [facility]. I contacted Mr. Lambert who stated he got his appointments mixed and he thought is was tomorrow. He does have his Ct scheduled for tomorrow. We reviewed this appointment. He is agreeable to have EUS at [facility] next week. I have sent the information to Dr. Cephas Darby and his scheduling team. Mr. Talcott is aware that [facility] will be contacting him to arrange. Navigator Location: [facility] (03/31/17 1600)   )Navigator Encounter Type: Telephone (03/31/17 1600) Telephone: [place] Call (03/31/17 1600)                               Coordination of Care: EUS (03/31/17 1600)                  Time Spent with Patient: 15 (03/31/17 1600)

## 2017-04-01 ENCOUNTER — Ambulatory Visit
Admission: RE | Admit: 2017-04-01 | Discharge: 2017-04-01 | Disposition: A | Discharge status: Home / Self Care | Payer: No Typology Code available for payment source | Source: Ambulatory Visit | Attending: Internal Medicine | Admitting: Internal Medicine

## 2017-04-01 ENCOUNTER — Telehealth: Payer: Self-pay | Admitting: Internal Medicine

## 2017-04-01 ENCOUNTER — Telehealth: Payer: Self-pay

## 2017-04-01 DIAGNOSIS — K838 Other specified diseases of biliary tract: Secondary | ICD-10-CM | POA: Diagnosis not present

## 2017-04-01 DIAGNOSIS — I700 Atherosclerosis of aorta: Secondary | ICD-10-CM | POA: Diagnosis not present

## 2017-04-01 DIAGNOSIS — C251 Malignant neoplasm of body of pancreas: Secondary | ICD-10-CM | POA: Insufficient documentation

## 2017-04-01 MED ORDER — IOPAMIDOL (ISOVUE-370) INJECTION 76%
100.0000 mL | Freq: Once | INTRAVENOUS | Status: AC | PRN
  Administered 2017-04-01: 100 mL via INTRAVENOUS

## 2017-04-01 NOTE — Telephone Encounter (Signed)
Spoke to Dr.Dew, kindly agrees to get the pt in for port placement next week. [pt awaiting EUS biopsy at [facility] on 11/14th]. Discussed with Mathis Fare.   H/B- please send a referral for Dr.Dew.

## 2017-04-01 NOTE — Telephone Encounter (Signed)
  Oncology Nurse Navigator Documentation Voicemail left with Nathaniel Saunders to return call. We need to arrange port a cath and chemo class asap for him. He has EUS scheduled for 11/14 at [facility] with Dr. Mont Dutton. Navigator Location: [facility] (04/01/17 1500)   )Navigator Encounter Type: Telephone (04/01/17 1500)                                                    Time Spent with Patient: 15 (04/01/17 1500)

## 2017-04-04 ENCOUNTER — Other Ambulatory Visit: Payer: Self-pay | Admitting: Internal Medicine

## 2017-04-04 ENCOUNTER — Telehealth (INDEPENDENT_AMBULATORY_CARE_PROVIDER_SITE_OTHER): Payer: Self-pay

## 2017-04-04 DIAGNOSIS — Z452 Encounter for adjustment and management of vascular access device: Secondary | ICD-10-CM

## 2017-04-04 NOTE — Telephone Encounter (Signed)
Referral has been placed. 

## 2017-04-04 NOTE — Telephone Encounter (Signed)
Attempted to contact the patient, left a message for a return call.

## 2017-04-05 ENCOUNTER — Telehealth: Payer: Self-pay

## 2017-04-05 ENCOUNTER — Other Ambulatory Visit: Payer: Self-pay | Admitting: Internal Medicine

## 2017-04-05 ENCOUNTER — Telehealth: Payer: Self-pay | Admitting: Internal Medicine

## 2017-04-05 ENCOUNTER — Telehealth: Payer: Self-pay | Admitting: *Deleted

## 2017-04-05 ENCOUNTER — Other Ambulatory Visit: Payer: Self-pay | Admitting: *Deleted

## 2017-04-05 ENCOUNTER — Inpatient Hospital Stay: Payer: No Typology Code available for payment source | Admitting: Internal Medicine

## 2017-04-05 ENCOUNTER — Other Ambulatory Visit: Payer: Self-pay

## 2017-04-05 MED ORDER — FENTANYL 25 MCG/HR TD PT72
25.0000 ug | MEDICATED_PATCH | TRANSDERMAL | 0 refills | Status: DC
Start: 2017-04-05 — End: 2017-04-29

## 2017-04-05 MED ORDER — FENTANYL 25 MCG/HR TD PT72: 25.0000 ug | MEDICATED_PATCH | TRANSDERMAL | 0 refills | Status: DC

## 2017-04-05 NOTE — Progress Notes (Signed)
x

## 2017-04-05 NOTE — Telephone Encounter (Signed)
I will advise Dr. B of patient request. I will contact patient once Rx is approved & ready to be picked up.

## 2017-04-05 NOTE — Telephone Encounter (Signed)
Fentanyl patch has been printed and signed. Rx can be picked up in [place] location tomorrow.

## 2017-04-05 NOTE — Assessment & Plan Note (Deleted)
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 No evidence of lymphadenopathy or distant metastatic disease. Imaging reviewed at the tumor conference; recommended pancreas protocol CT scan for further evaluation of the vasculature. Recommend EUS/ for biopsy and staging purposes.  # If in the absence of any distant metastatic disease- given the high possibility of borderline resectable pancreatic cancer [based on portal vein involvement; awaiting further imaging; EUS]- I would recommend aggressive approach with FOLFIRINOX chemotherapy; with evaluation for surgical resection.  # Genetic testing- discussed regarding genetic testing given the diagnosis of pancreatic cancer in mother; grandmother the patient's young age. He is interested.  # Pain control- poorly controlled. Recommend fentanyl patch 25 g and also Percocet. New prescriptions skin. Discussed the potential side effects-including nausea; constipation dizziness; issues with the driving.  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  # Patient will follow-up with me in few days after the biopsy/CT scan. Today with LFTs and CA-19-9. PT/PTT.  Thank you for allowing me to participate in the care of your pleasant patient. Please do not hesitate to contact me with questions or concerns in the interim.Discussed with Nurse Navigator, Mathis Fare.

## 2017-04-05 NOTE — Telephone Encounter (Signed)
Spoke with patient and explained need for chemo class and port placement. Patient verbalizes understanding. Left voicemail with Dr. Bunnie Domino office, Mickel Baas, to call patient on his cell to schedule port placement. Once that is done, will coordinate chemo class.

## 2017-04-05 NOTE — Telephone Encounter (Signed)
-----   Message from Nathaniel Saunders sent at 04/05/2017  9:23 AM EST ----- Regarding: refill pain patch Pt called and cancelled his appt and rescheduled and said he needed a rfeill on his pain patches

## 2017-04-05 NOTE — Telephone Encounter (Signed)
Pt cancelled appt; re-scheduled for 11/19; will refill fentanyl patch x 3 for now. I left message.

## 2017-04-05 NOTE — Progress Notes (Deleted)
[facility] CONSULT NOTE  Patient Care Team: Patient, No Pcp Per as PCP - General (General Practice) Clent Jacks, RN as Registered Nurse  CHIEF COMPLAINTS/PURPOSE OF CONSULTATION: Pancreatic mass  #  Oncology History   # November 2018- pancreatic neck mass [~2.5cm]; smaller pancreatic tail mass.    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  male with no significant medical history except for mild asthma- is currently being evaluated for abdominal pain/pancreatic mass.  Patient states that she has been having ongoing worsening abdominal pain in the epigastric region- for the last 2 months. This gets worse with eating. It radiates to the back. At worst- he states his pain is 9 as scale of 10. He has lost about 40 pounds. He has couple of episodes of nausea vomiting.    Patient was recently evaluated in the emergency room- that showed a 2.5 cm pancreatic neck mass; smaller pancreatic tail mass. The pancreatic mass seems to involve the venous vasculature. LFTs within normal limits.  Patient does have a family history of pancreatic cancer- mother/maternal grandmother. No history of breast cancer ovarian cancer.  ROS: A complete 10 point review of system is done which is negative except mentioned above in history of present illness  MEDICAL HISTORY:  Asthma [well controlled] Past Medical History:  Diagnosis Date  . Asthma   . Pancreatic mass   . Pancreatic mass     SURGICAL HISTORY: No past surgical history on file.  SOCIAL HISTORY:  he lives in [place]; sister/girl friend; he has children aged  39/18/ 6 years;  quit smkoing - 5 months; alcohol- none; truck driver. Social History   Socioeconomic History  . Marital status: Single    Spouse name: Not on file  . Number  of children: Not on file  . Years of education: Not on file  . Highest education level: Not on file  Social Needs  . Financial resource strain: Not on file  . Food insecurity - worry: Not on file  . Food insecurity - inability: Not on file  . Transportation needs - medical: Not on file  . Transportation needs - non-medical: Not on file  Occupational History  . Not on file  Tobacco Use  . Smoking status: Former Smoker    Packs/day: 0.25    Years: 5.00    Pack years: 1.25    Types: Cigarettes  . Smokeless tobacco: Never Used  Substance and Sexual Activity  . Alcohol use: No  . Drug use: No  . Sexual activity: Not on file  Other Topics Concern  . Not on file  Social History Narrative  . Not on file    FAMILY HISTORY: Family History  Problem Relation Age of Onset  . Pancreatic cancer Mother 75  . Pancreatic cancer Maternal Grandmother 60    ALLERGIES:  has No Known Allergies.  MEDICATIONS:  Current Outpatient Medications  Medication Sig Dispense Refill  . docusate sodium (COLACE) 100 MG capsule Take 1 tablet once or twice daily as needed for constipation while taking narcotic pain medicine 30 capsule 0  . fentaNYL (DURAGESIC - DOSED MCG/HR) 25 MCG/HR patch Place 1 patch (25 mcg total) onto the skin every 3 (three) days. 5 patch 0  .  ondansetron (ZOFRAN ODT) 4 MG disintegrating tablet Allow 1-2 tablets to dissolve in your mouth every 8 hours as needed for nausea/vomiting 30 tablet 0  . oxyCODONE-acetaminophen (ROXICET) 5-325 MG tablet 6- 8 hours as needed for pain 45 tablet 0   No current facility-administered medications for this visit.       Marland Kitchen  PHYSICAL EXAMINATION: ECOG PERFORMANCE STATUS: 1 - Symptomatic but completely ambulatory  There were no vitals filed for this visit. There were no vitals filed for this visit.  GENERAL: Well-nourished well-developed; Alert, no distress and comfortable.   Alone. EYES: no pallor or icterus OROPHARYNX: no thrush or  ulceration; poor dentition  NECK: supple, no masses felt LYMPH:  no palpable lymphadenopathy in the cervical, axillary or inguinal regions LUNGS: clear to auscultation and  No wheeze or crackles HEART/CVS: regular rate & rhythm and no murmurs; No lower extremity edema ABDOMEN: abdomen soft, non-tender and normal bowel sounds Musculoskeletal:no cyanosis of digits and no clubbing  PSYCH: alert & oriented x 3 with fluent speech NEURO: no focal motor/sensory deficits SKIN:  no rashes or significant lesions  LABORATORY DATA:  I have reviewed the data as listed Lab Results  Component Value Date   WBC 5.9 03/23/2017   HGB 14.9 03/23/2017   HCT 44.3 03/23/2017   MCV 89.7 03/23/2017   PLT 276 03/23/2017   Recent Labs    01/29/17 1025 03/23/17 0919 03/24/17 1537  NA 137 138  --   K 3.7 3.6  --   CL 105 103  --   CO2 26 25  --   GLUCOSE 96 94  --   BUN 9 8  --   CREATININE 0.86 1.00  --   CALCIUM 9.5 9.5  --   GFRNONAA >60 >60  --   GFRAA >60 >60  --   PROT 8.1 8.2* 8.0  ALBUMIN 4.1 4.2 4.2  AST 20 22 21   ALT 16* 13* 13*  ALKPHOS 57 48 54  BILITOT 1.2 1.3* 0.6  BILIDIR  --   --  0.1  IBILI  --   --  0.5    RADIOGRAPHIC STUDIES: I have personally reviewed the radiological images as listed and agreed with the findings in the report. Ct Abdomen Pelvis W Contrast  Result Date: 03/23/2017 CLINICAL DATA:  Postprandial epigastric and left-sided abdominal pain for 1 month. Nausea. EXAM: CT ABDOMEN AND PELVIS WITH CONTRAST TECHNIQUE: Multidetector CT imaging of the abdomen and pelvis was performed using the standard protocol following bolus administration of intravenous contrast. CONTRAST:  135mL ISOVUE-300 IOPAMIDOL (ISOVUE-300) INJECTION 61% COMPARISON:  None. FINDINGS: Lower Chest: No acute findings. Pleural-parenchymal scarring in both lung bases. Hepatobiliary: No hepatic masses identified. Gallbladder is unremarkable. Pancreas: Pancreatic ductal dilatation is seen in the body and  tail. A focal ill-defined low-attenuation lesion is seen in the pancreatic neck at site of pancreatic duct stricture which measures 2.6 x 2.0 cm on image 22/2. This encases and narrows the proximal main portal vein common is highly suspicious for carcinoma. A second smaller ill-defined low-attenuation area seen in the pancreatic tail measuring 1.5 cm on image 16/2, which may also represent pancreatic neoplasm. Spleen: Within normal limits in size and appearance. Adrenals/Urinary Tract: No masses identified. Small right renal cyst noted. No evidence of hydronephrosis. Mild diffuse wall thickening of urinary bladder may be due to chronic bladder outlet obstruction or cystitis. Stomach/Bowel: No evidence of obstruction, inflammatory process or abnormal fluid collections. Vascular/Lymphatic: No pathologically enlarged lymph nodes. No abdominal aortic  aneurysm. Aortic atherosclerosis. Reproductive:  Mildly enlarged prostate. Other:  None. Musculoskeletal:  No suspicious bone lesions identified. IMPRESSION: 2.6 cm low-attenuation mass in the pancreatic neck causing pancreatic ductal dilatation and encasing and narrowing the proximal main portal vein, highly suspicious for pancreatic adenocarcinoma. Smaller 1.5 cm .ow-attenuation lesion in the pancreatic tail measuring 1.5 cm, which may also be due pancreatic neoplasm. No evidence of metastatic disease within the abdomen or pelvis. Incidental findings which include mildly enlarged prostate, and aortic atherosclerosis. Electronically Signed   By: Earle Gell M.D.   On: 03/23/2017 12:38   Ct Pancreas Abd W/wo  Result Date: 04/01/2017 CLINICAL DATA:  Pancreas cancer EXAM: CT ABDOMEN WITHOUT AND WITH CONTRAST TECHNIQUE: Multidetector CT imaging of the abdomen was performed following the standard protocol before and following the bolus administration of intravenous contrast. CONTRAST:  149mL ISOVUE-370 IOPAMIDOL (ISOVUE-370) INJECTION 76% COMPARISON:  None. FINDINGS: Lower  chest: Scarring and architectural distortion within the right middle lobe and left lower lobe identified. Moderate changes of centrilobular emphysema identified. No pleural effusion. Hepatobiliary: No focal liver abnormality. The gallbladder is normal. The common bile duct is increased in caliber proximally measuring 9 mm. Focal narrowing of the duct at the level of the pancreatic head noted. Pancreas: Hypoenhancing mass within the head of pancreas measures 1.8 by 2.2 by 2.3 cm. Within the neck, body and tail of pancreas there is dilatation of the pancreatic duct. The main portal vein is completely encased and narrowed by the mass, image 35 of series 5 and image 62 of series 6. The superior mesenteric artery is uninvolved. The celiac trunk appears uninvolved. The lesion touches a portion of the common hepatic artery but does not completely encased the vessel, image 64 of series 6. Cystic lesion within the tail of pancreas measures 6 mm. Within the distal tail of pancreas there is an area of relative hypoenhancement which may represent a second mass. This measures 1.7 x 1.4 cm, image 21 of series 4. Spleen: Normal in size without focal abnormality. Adrenals/Urinary Tract: Normal appearance of the adrenal glands. Right kidney cysts. No mass or hydronephrosis. Stomach/Bowel: Stomach is within normal limits. Appendix appears normal. No evidence of bowel wall thickening, distention, or inflammatory changes. Vascular/Lymphatic: Aortic atherosclerosis. No aneurysm. No adenopathy. Other: No abdominal wall hernia or abnormality. Musculoskeletal: No acute or significant osseous findings. IMPRESSION: 1. Again seen is a hypoenhancing mass within the head of pancreas with associated common bile duct dilatation, pancreatic duct dilatation and encasement and narrowing of the main portal vein. This is highly worrisome for pancreatic adenocarcinoma. No evidence for celiac trunk or superior mesenteric artery encasement. No evidence  for liver metastasis or upper abdominal adenopathy. 2. A second indeterminate hypoenhancing lesion is noted within the distal tail of pancreas. A third lesion within the pancreatic tail has a benign cystic appearance. 3.  Aortic Atherosclerosis (ICD10-I70.0). Electronically Signed   By: Kerby Moors M.D.   On: 04/01/2017 12:06    ASSESSMENT & PLAN:   No problem-specific Assessment & Plan notes found for this encounter.  # I reviewed the blood work- with the patient in detail; also reviewed the imaging independently [as summarized above]; and with the patient in detail.   All questions were answered. The patient knows to call the clinic with any problems, questions or concerns.    Cammie Sickle, MD 04/05/2017 8:44 AM

## 2017-04-06 ENCOUNTER — Telehealth: Payer: Self-pay | Admitting: *Deleted

## 2017-04-06 ENCOUNTER — Encounter (INDEPENDENT_AMBULATORY_CARE_PROVIDER_SITE_OTHER): Payer: Self-pay

## 2017-04-06 NOTE — Telephone Encounter (Signed)
Contacted patient and instructed to call Dr. Bunnie Domino office for port placement scheduling. Patient verbalized understanding.

## 2017-04-11 ENCOUNTER — Ambulatory Visit: Payer: No Typology Code available for payment source | Admitting: Internal Medicine

## 2017-04-11 ENCOUNTER — Other Ambulatory Visit (INDEPENDENT_AMBULATORY_CARE_PROVIDER_SITE_OTHER): Payer: Self-pay | Admitting: Vascular Surgery

## 2017-04-11 ENCOUNTER — Other Ambulatory Visit: Payer: Self-pay | Admitting: Internal Medicine

## 2017-04-11 ENCOUNTER — Telehealth: Payer: Self-pay

## 2017-04-11 NOTE — Telephone Encounter (Signed)
  Oncology Nurse Navigator Documentation Voicemail left with Mr. Rosenberger for appointment reminder to see Dr. Rogue Bussing at [facility] 11/20 at 11:30. Asked for return call to ensure he received message. Navigator Location: [facility] (04/11/17 1100)   )Navigator Encounter Type: Telephone;Follow-up Appt (04/11/17 1100) Telephone: Outgoing Call;Appt Confirmation/Clarification (04/11/17 1100)                                                  Time Spent with Patient: 15 (04/11/17 1100)

## 2017-04-12 ENCOUNTER — Inpatient Hospital Stay (HOSPITAL_BASED_OUTPATIENT_CLINIC_OR_DEPARTMENT_OTHER): Payer: No Typology Code available for payment source | Admitting: Internal Medicine

## 2017-04-12 ENCOUNTER — Other Ambulatory Visit: Payer: Self-pay | Admitting: *Deleted

## 2017-04-12 VITALS — BP 118/76 | HR 66 | Temp 97.2°F | Resp 16 | Wt 140.4 lb

## 2017-04-12 DIAGNOSIS — R634 Abnormal weight loss: Secondary | ICD-10-CM

## 2017-04-12 DIAGNOSIS — C251 Malignant neoplasm of body of pancreas: Secondary | ICD-10-CM

## 2017-04-12 DIAGNOSIS — Z87891 Personal history of nicotine dependence: Secondary | ICD-10-CM | POA: Diagnosis not present

## 2017-04-12 DIAGNOSIS — I700 Atherosclerosis of aorta: Secondary | ICD-10-CM | POA: Diagnosis not present

## 2017-04-12 DIAGNOSIS — Z800 Family history of malignant neoplasm of digestive organs: Secondary | ICD-10-CM

## 2017-04-12 DIAGNOSIS — G893 Neoplasm related pain (acute) (chronic): Secondary | ICD-10-CM | POA: Diagnosis not present

## 2017-04-12 DIAGNOSIS — Z79899 Other long term (current) drug therapy: Secondary | ICD-10-CM | POA: Diagnosis not present

## 2017-04-12 DIAGNOSIS — R112 Nausea with vomiting, unspecified: Secondary | ICD-10-CM

## 2017-04-12 DIAGNOSIS — N400 Enlarged prostate without lower urinary tract symptoms: Secondary | ICD-10-CM | POA: Diagnosis not present

## 2017-04-12 DIAGNOSIS — J45909 Unspecified asthma, uncomplicated: Secondary | ICD-10-CM | POA: Diagnosis not present

## 2017-04-12 MED ORDER — PROCHLORPERAZINE MALEATE 10 MG PO TABS: 10.0000 mg | ORAL_TABLET | Freq: Four times a day (QID) | ORAL | 1 refills | Status: DC | PRN

## 2017-04-12 MED ORDER — LIDOCAINE-PRILOCAINE 2.5-2.5 % EX CREA: 1.0000 "application " | TOPICAL_CREAM | CUTANEOUS | 0 refills | Status: DC | PRN

## 2017-04-12 MED ORDER — ONDANSETRON HCL 8 MG PO TABS: ORAL_TABLET | ORAL | 1 refills | Status: DC

## 2017-04-12 NOTE — Progress Notes (Signed)
START ON PATHWAY REGIMEN - Pancreatic     A cycle is every 14 days:     Oxaliplatin      Leucovorin      Irinotecan      5-Fluorouracil      5-Fluorouracil   **Always confirm dose/schedule in your pharmacy ordering system**    Patient Characteristics: Adenocarcinoma, Borderline Resectable or High Risk, Potentially Resectable, Primary Neoadjuvant Therapy, PS = 0, 1 Histology: Adenocarcinoma Current evidence of distant metastases<= No AJCC T Category: T3 AJCC N Category: N0 AJCC M Category: M0 AJCC 8 Stage Grouping: IIA Intent of Therapy: Curative Intent, Discussed with Patient

## 2017-04-12 NOTE — Progress Notes (Signed)
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  # FOLFIRINOX [neo-adj chemo]   # Family history of pancreatic cancer     Malignant tumor of body of pancreas ([place])   03/24/2017 Initial Diagnosis    Malignant tumor of body of pancreas ([facility])        HISTORY OF PRESENTING ILLNESS:  Nathaniel Saunders 46 y.o.  male with newly diagnosed pancreatic mass is here to review the results of his biopsy/plan of care.  Patient interim underwent endoscopic ultrasound biopsy of the pancreatic mass at [facility].  Patient states his pain is controlled; is only exacerbated when he eats fatty food.  Continues to be on fentanyl patch and Percocet.  His appetite is fair.  No nausea no vomiting.  He is currently awaiting port placement; chemotherapy education next week.   ROS: A complete 10 point review of system is done which is negative except mentioned above in history of present illness  MEDICAL HISTORY:  Asthma [well controlled] Past Medical History:  Diagnosis Date  . Asthma   . Pancreatic mass   . Pancreatic mass     SURGICAL HISTORY: No past surgical history on file.  SOCIAL HISTORY:  he lives in [place]; sister/girl friend; he has children aged  81/18/ 6 years;  quit smkoing - 5 months; alcohol- none; truck driver. Social History   Socioeconomic History  . Marital status: Single    Spouse name: Not on file  . Number of children: Not on file  . Years of education: Not on file  . Highest education level: Not on file  Social Needs  . Financial resource strain: Not on file  . Food insecurity - worry: Not on file  . Food  insecurity - inability: Not on file  . Transportation needs - medical: Not on file  . Transportation needs - non-medical: Not on file  Occupational History  . Not on file  Tobacco Use  . Smoking status: Former Smoker    Packs/day: 0.25    Years: 5.00    Pack years: 1.25    Types: Cigarettes  . Smokeless tobacco: Never Used  Substance and Sexual Activity  . Alcohol use: No  . Drug use: No  . Sexual activity: Not on file  Other Topics Concern  . Not on file  Social History Narrative  . Not on file    FAMILY HISTORY: 2 daughters [23 and 28]; boy- 71 years; one sister- 42 years.  Family History  Problem Relation Age of Onset  . Pancreatic cancer Mother 55  . Pancreatic cancer Maternal Grandmother 60    ALLERGIES:  has No Known Allergies.  MEDICATIONS:  Current Outpatient Medications  Medication Sig Dispense Refill  . albuterol (PROVENTIL HFA;VENTOLIN HFA) 108 (90 Base) MCG/ACT inhaler Inhale 1-2 puffs every 6 (six) hours as needed into the lungs for wheezing or shortness of breath.    . docusate sodium (COLACE) 100 MG capsule Take 1 tablet once or twice daily as needed for constipation while taking narcotic pain medicine 30 capsule 0  . fentaNYL (DURAGESIC - DOSED MCG/HR) 25 MCG/HR patch Place 1 patch (25 mcg total) every 3 (three) days onto the skin. (  Patient taking differently: Place 25 mcg every three (3) days as needed onto the skin (for severe pain.). ) 3 patch 0  . oxyCODONE-acetaminophen (ROXICET) 5-325 MG tablet 6- 8 hours as needed for pain (Patient taking differently: Take 1 tablet every 6 (six) hours as needed by mouth (for pain.). ) 45 tablet 0  . lidocaine-prilocaine (EMLA) cream Apply 1 application topically as needed. Apply generously over the Mediport 45 minutes prior to chemotherapy. 30 g 0  . ondansetron (ZOFRAN ODT) 4 MG disintegrating tablet Allow 1-2 tablets to dissolve in your mouth every 8 hours as needed for nausea/vomiting (Patient not taking: Reported on  04/12/2017) 30 tablet 0  . ondansetron (ZOFRAN) 8 MG tablet One pill every 8 hours as needed for nausea/vomitting. 40 tablet 1  . prochlorperazine (COMPAZINE) 10 MG tablet Take 1 tablet (10 mg total) by mouth every 6 (six) hours as needed for nausea or vomiting. 40 tablet 1   No current facility-administered medications for this visit.       Marland Kitchen  PHYSICAL EXAMINATION: ECOG PERFORMANCE STATUS: 1 - Symptomatic but completely ambulatory  Vitals:   04/12/17 1144  BP: 118/76  Pulse: 66  Resp: 16  Temp: (!) 97.2 F (36.2 C)   Filed Weights   04/12/17 1144  Weight: 140 lb 6.9 oz (63.7 kg)    GENERAL: Well-nourished well-developed; Alert, no distress and comfortable.   Alone. EYES: no pallor or icterus OROPHARYNX: no thrush or ulceration; poor dentition  NECK: supple, no masses felt LYMPH:  no palpable lymphadenopathy in the cervical, axillary or inguinal regions LUNGS: clear to auscultation and  No wheeze or crackles HEART/CVS: regular rate & rhythm and no murmurs; No lower extremity edema ABDOMEN: abdomen soft, non-tender and normal bowel sounds Musculoskeletal:no cyanosis of digits and no clubbing  PSYCH: alert & oriented x 3 with fluent speech NEURO: no focal motor/sensory deficits SKIN:  no rashes or significant lesions  LABORATORY DATA:  I have reviewed the data as listed Lab Results  Component Value Date   WBC 5.9 03/23/2017   HGB 14.9 03/23/2017   HCT 44.3 03/23/2017   MCV 89.7 03/23/2017   PLT 276 03/23/2017   Recent Labs    01/29/17 1025 03/23/17 0919 03/24/17 1537  NA 137 138  --   K 3.7 3.6  --   CL 105 103  --   CO2 26 25  --   GLUCOSE 96 94  --   BUN 9 8  --   CREATININE 0.86 1.00  --   CALCIUM 9.5 9.5  --   GFRNONAA >60 >60  --   GFRAA >60 >60  --   PROT 8.1 8.2* 8.0  ALBUMIN 4.1 4.2 4.2  AST 20 22 21   ALT 16* 13* 13*  ALKPHOS 57 48 54  BILITOT 1.2 1.3* 0.6  BILIDIR  --   --  0.1  IBILI  --   --  0.5    RADIOGRAPHIC STUDIES: I have  personally reviewed the radiological images as listed and agreed with the findings in the report. Ct Abdomen Pelvis W Contrast  Result Date: 03/23/2017 CLINICAL DATA:  Postprandial epigastric and left-sided abdominal pain for 1 month. Nausea. EXAM: CT ABDOMEN AND PELVIS WITH CONTRAST TECHNIQUE: Multidetector CT imaging of the abdomen and pelvis was performed using the standard protocol following bolus administration of intravenous contrast. CONTRAST:  159mL ISOVUE-300 IOPAMIDOL (ISOVUE-300) INJECTION 61% COMPARISON:  None. FINDINGS: Lower Chest: No acute findings. Pleural-parenchymal scarring in both lung bases. Hepatobiliary:  No hepatic masses identified. Gallbladder is unremarkable. Pancreas: Pancreatic ductal dilatation is seen in the body and tail. A focal ill-defined low-attenuation lesion is seen in the pancreatic neck at site of pancreatic duct stricture which measures 2.6 x 2.0 cm on image 22/2. This encases and narrows the proximal main portal vein common is highly suspicious for carcinoma. A second smaller ill-defined low-attenuation area seen in the pancreatic tail measuring 1.5 cm on image 16/2, which may also represent pancreatic neoplasm. Spleen: Within normal limits in size and appearance. Adrenals/Urinary Tract: No masses identified. Small right renal cyst noted. No evidence of hydronephrosis. Mild diffuse wall thickening of urinary bladder may be due to chronic bladder outlet obstruction or cystitis. Stomach/Bowel: No evidence of obstruction, inflammatory process or abnormal fluid collections. Vascular/Lymphatic: No pathologically enlarged lymph nodes. No abdominal aortic aneurysm. Aortic atherosclerosis. Reproductive:  Mildly enlarged prostate. Other:  None. Musculoskeletal:  No suspicious bone lesions identified. IMPRESSION: 2.6 cm low-attenuation mass in the pancreatic neck causing pancreatic ductal dilatation and encasing and narrowing the proximal main portal vein, highly suspicious for  pancreatic adenocarcinoma. Smaller 1.5 cm .ow-attenuation lesion in the pancreatic tail measuring 1.5 cm, which may also be due pancreatic neoplasm. No evidence of metastatic disease within the abdomen or pelvis. Incidental findings which include mildly enlarged prostate, and aortic atherosclerosis. Electronically Signed   By: Earle Gell M.D.   On: 03/23/2017 12:38   Ct Pancreas Abd W/wo  Result Date: 04/01/2017 CLINICAL DATA:  Pancreas cancer EXAM: CT ABDOMEN WITHOUT AND WITH CONTRAST TECHNIQUE: Multidetector CT imaging of the abdomen was performed following the standard protocol before and following the bolus administration of intravenous contrast. CONTRAST:  120mL ISOVUE-370 IOPAMIDOL (ISOVUE-370) INJECTION 76% COMPARISON:  None. FINDINGS: Lower chest: Scarring and architectural distortion within the right middle lobe and left lower lobe identified. Moderate changes of centrilobular emphysema identified. No pleural effusion. Hepatobiliary: No focal liver abnormality. The gallbladder is normal. The common bile duct is increased in caliber proximally measuring 9 mm. Focal narrowing of the duct at the level of the pancreatic head noted. Pancreas: Hypoenhancing mass within the head of pancreas measures 1.8 by 2.2 by 2.3 cm. Within the neck, body and tail of pancreas there is dilatation of the pancreatic duct. The main portal vein is completely encased and narrowed by the mass, image 35 of series 5 and image 62 of series 6. The superior mesenteric artery is uninvolved. The celiac trunk appears uninvolved. The lesion touches a portion of the common hepatic artery but does not completely encased the vessel, image 64 of series 6. Cystic lesion within the tail of pancreas measures 6 mm. Within the distal tail of pancreas there is an area of relative hypoenhancement which may represent a second mass. This measures 1.7 x 1.4 cm, image 21 of series 4. Spleen: Normal in size without focal abnormality. Adrenals/Urinary  Tract: Normal appearance of the adrenal glands. Right kidney cysts. No mass or hydronephrosis. Stomach/Bowel: Stomach is within normal limits. Appendix appears normal. No evidence of bowel wall thickening, distention, or inflammatory changes. Vascular/Lymphatic: Aortic atherosclerosis. No aneurysm. No adenopathy. Other: No abdominal wall hernia or abnormality. Musculoskeletal: No acute or significant osseous findings. IMPRESSION: 1. Again seen is a hypoenhancing mass within the head of pancreas with associated common bile duct dilatation, pancreatic duct dilatation and encasement and narrowing of the main portal vein. This is highly worrisome for pancreatic adenocarcinoma. No evidence for celiac trunk or superior mesenteric artery encasement. No evidence for liver metastasis or upper abdominal  adenopathy. 2. A second indeterminate hypoenhancing lesion is noted within the distal tail of pancreas. A third lesion within the pancreatic tail has a benign cystic appearance. 3.  Aortic Atherosclerosis (ICD10-I70.0). Electronically Signed   By: Kerby Moors M.D.   On: 04/01/2017 12:06    ASSESSMENT & PLAN:   Malignant tumor of body of pancreas ([place]) # Pancreatic mass- approximately 2.5 cm in size; partially encasing the portal vein. Stage II;  EUS- uT3nN0- abutting SMV/encasing portal vein.  No evidence of any local adenopathy/or liver metastasis.  Recommend a CT of the chest for complete staging.  # Given the absence of any distant metastatic disease- given the  borderline resectable pancreatic cancer [based on portal vein involvement;  I would recommend aggressive approach with FOLFIRINOX chemotherapy; with evaluation for surgical resection [patient interested in [place]. Will make a referral to [facility]; Dr.Zani.  Discussed with clinical trials; no trial available.  # Discussed the potential side effects including but not limited to-increasing fatigue, nausea vomiting, diarrhea, hair loss, sores in the mouth,  increase risk of infection and also neuropathy.   # Growth factor-Neulasta/On pro would be given as prophylaxis for chemotherapy-induced neutropenia to prevent febrile neutropenias.    # Genetic testing- discussed regarding genetic testing given the diagnosis of pancreatic cancer in mother; grandmother the patient's young age. He is interested.  We will order at the next visit.  # Pain control-better controlled; continue fentanyl patch 25 g and also Percocet.  Better controlled.  #Prescription for EMLA cream; antiemetics sent to pharmacy.  Imodium printed.  #Port placement plan on the 26th; chemo education 27th; labs chemotherapy on the 28th.  1 week-lab possible fluids;   #Follow-up with me in 2 weeks/Tuesday-[place].  # 40 minutes face-to-face with the patient discussing the above plan of care; more than 50% of time spent on prognosis/ natural history; counseling and coordination.   # I reviewed the blood work- with the patient in detail; also reviewed the imaging independently [as summarized above]; and with the patient in detail.   All questions were answered. The patient knows to call the clinic with any problems, questions or concerns.    Cammie Sickle, MD 04/12/2017 12:57 PM

## 2017-04-12 NOTE — Assessment & Plan Note (Addendum)
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  No evidence of any local adenopathy/or liver metastasis.  Recommend a CT of the chest for complete staging.  # Given the absence of any distant metastatic disease- given the  borderline resectable pancreatic cancer [based on portal vein involvement;  I would recommend aggressive approach with FOLFIRINOX chemotherapy; with evaluation for surgical resection [patient interested in [place]. Will make a referral to [facility]; Dr.Zani.  Discussed with clinical trials; no trial available.  # Discussed the potential side effects including but not limited to-increasing fatigue, nausea vomiting, diarrhea, hair loss, sores in the mouth, increase risk of infection and also neuropathy.   # Growth factor-Neulasta/On pro would be given as prophylaxis for chemotherapy-induced neutropenia to prevent febrile neutropenias.    # Genetic testing- discussed regarding genetic testing given the diagnosis of pancreatic cancer in mother; grandmother the patient's young age. He is interested.  We will order at the next visit.  # Pain control-better controlled; continue fentanyl patch 25 g and also Percocet.  Better controlled.  #Prescription for EMLA cream; antiemetics sent to pharmacy.  Imodium printed.  #Port placement plan on the 26th; chemo education 27th; labs chemotherapy on the 28th.  1 week-lab possible fluids;   #Follow-up with me in 2 weeks/Tuesday-[place].  # 40 minutes face-to-face with the patient discussing the above plan of care; more than 50% of time spent on prognosis/ natural history; counseling and coordination.

## 2017-04-13 ENCOUNTER — Telehealth: Payer: Self-pay | Admitting: *Deleted

## 2017-04-13 NOTE — Patient Instructions (Signed)
Oxaliplatin Injection What is this medicine? OXALIPLATIN (ox AL i PLA tin) is a chemotherapy drug. It targets fast dividing cells, like cancer cells, and causes these cells to die. This medicine is used to treat cancers of the colon and rectum, and many other cancers. This medicine may be used for other purposes; ask your health care provider or pharmacist if you have questions. COMMON BRAND NAME(S): Eloxatin What should I tell my health care provider before I take this medicine? They need to know if you have any of these conditions: -kidney disease -an unusual or allergic reaction to oxaliplatin, other chemotherapy, other medicines, foods, dyes, or preservatives -pregnant or trying to get pregnant -breast-feeding How should I use this medicine? This drug is given as an infusion into a vein. It is administered in a hospital or clinic by a specially trained health care professional. Talk to your pediatrician regarding the use of this medicine in children. Special care may be needed. Overdosage: If you think you have taken too much of this medicine contact a poison control center or emergency room at once. NOTE: This medicine is only for you. Do not share this medicine with others. What if I miss a dose? It is important not to miss a dose. Call your doctor or health care professional if you are unable to keep an appointment. What may interact with this medicine? -medicines to increase blood counts like filgrastim, pegfilgrastim, sargramostim -probenecid -some antibiotics like amikacin, gentamicin, neomycin, polymyxin B, streptomycin, tobramycin -zalcitabine Talk to your doctor or health care professional before taking any of these medicines: -acetaminophen -aspirin -ibuprofen -ketoprofen -naproxen This list may not describe all possible interactions. Give your health care provider a list of all the medicines, herbs, non-prescription drugs, or dietary supplements you use. Also tell them if  you smoke, drink alcohol, or use illegal drugs. Some items may interact with your medicine. What should I watch for while using this medicine? Your condition will be monitored carefully while you are receiving this medicine. You will need important blood work done while you are taking this medicine. This medicine can make you more sensitive to cold. Do not drink cold drinks or use ice. Cover exposed skin before coming in contact with cold temperatures or cold objects. When out in cold weather wear warm clothing and cover your mouth and nose to warm the air that goes into your lungs. Tell your doctor if you get sensitive to the cold. This drug may make you feel generally unwell. This is not uncommon, as chemotherapy can affect healthy cells as well as cancer cells. Report any side effects. Continue your course of treatment even though you feel ill unless your doctor tells you to stop. In some cases, you may be given additional medicines to help with side effects. Follow all directions for their use. Call your doctor or health care professional for advice if you get a fever, chills or sore throat, or other symptoms of a cold or flu. Do not treat yourself. This drug decreases your body's ability to fight infections. Try to avoid being around people who are sick. This medicine may increase your risk to bruise or bleed. Call your doctor or health care professional if you notice any unusual bleeding. Be careful brushing and flossing your teeth or using a toothpick because you may get an infection or bleed more easily. If you have any dental work done, tell your dentist you are receiving this medicine. Avoid taking products that contain aspirin, acetaminophen, ibuprofen, naproxen,   or ketoprofen unless instructed by your doctor. These medicines may hide a fever. Do not become pregnant while taking this medicine. Women should inform their doctor if they wish to become pregnant or think they might be pregnant. There  is a potential for serious side effects to an unborn child. Talk to your health care professional or pharmacist for more information. Do not breast-feed an infant while taking this medicine. Call your doctor or health care professional if you get diarrhea. Do not treat yourself. What side effects may I notice from receiving this medicine? Side effects that you should report to your doctor or health care professional as soon as possible: -allergic reactions like skin rash, itching or hives, swelling of the face, lips, or tongue -low blood counts - This drug may decrease the number of white blood cells, red blood cells and platelets. You may be at increased risk for infections and bleeding. -signs of infection - fever or chills, cough, sore throat, pain or difficulty passing urine -signs of decreased platelets or bleeding - bruising, pinpoint red spots on the skin, black, tarry stools, nosebleeds -signs of decreased red blood cells - unusually weak or tired, fainting spells, lightheadedness -breathing problems -chest pain, pressure -cough -diarrhea -jaw tightness -mouth sores -nausea and vomiting -pain, swelling, redness or irritation at the injection site -pain, tingling, numbness in the hands or feet -problems with balance, talking, walking -redness, blistering, peeling or loosening of the skin, including inside the mouth -trouble passing urine or change in the amount of urine Side effects that usually do not require medical attention (report to your doctor or health care professional if they continue or are bothersome): -changes in vision -constipation -hair loss -loss of appetite -metallic taste in the mouth or changes in taste -stomach pain This list may not describe all possible side effects. Call your doctor for medical advice about side effects. You may report side effects to FDA at 1-800-FDA-1088. Where should I keep my medicine? This drug is given in a hospital or clinic and  will not be stored at home. NOTE: This sheet is a summary. It may not cover all possible information. If you have questions about this medicine, talk to your doctor, pharmacist, or health care provider.  2018 [employer]/Gold Standard (2007-12-05 17:22:47) Irinotecan injection What is this medicine? IRINOTECAN (ir in oh TEE kan ) is a chemotherapy drug. It is used to treat colon and rectal cancer. This medicine may be used for other purposes; ask your health care provider or pharmacist if you have questions. COMMON BRAND NAME(S): Camptosar What should I tell my health care provider before I take this medicine? They need to know if you have any of these conditions: -blood disorders -dehydration -diarrhea -infection (especially a virus infection such as chickenpox, cold sores, or herpes) -liver disease -low blood counts, like low white cell, platelet, or red cell counts -recent or ongoing radiation therapy -an unusual or allergic reaction to irinotecan, sorbitol, other chemotherapy, other medicines, foods, dyes, or preservatives -pregnant or trying to get pregnant -breast-feeding How should I use this medicine? This drug is given as an infusion into a vein. It is administered in a hospital or clinic by a specially trained health care professional. Talk to your pediatrician regarding the use of this medicine in children. Special care may be needed. Overdosage: If you think you have taken too much of this medicine contact a poison control center or emergency room at once. NOTE: This medicine is only for you. Do not  share this medicine with others. What if I miss a dose? It is important not to miss your dose. Call your doctor or health care professional if you are unable to keep an appointment. What may interact with this medicine? Do not take this medicine with any of the following medications: -atazanavir -certain medicines for fungal infections like itraconazole and ketoconazole -St.  John's Wort This medicine may also interact with the following medications: -dexamethasone -diuretics -laxatives -medicines for seizures like carbamazepine, mephobarbital, phenobarbital, phenytoin, primidone -medicines to increase blood counts like filgrastim, pegfilgrastim, sargramostim -prochlorperazine -vaccines This list may not describe all possible interactions. Give your health care provider a list of all the medicines, herbs, non-prescription drugs, or dietary supplements you use. Also tell them if you smoke, drink alcohol, or use illegal drugs. Some items may interact with your medicine. What should I watch for while using this medicine? Your condition will be monitored carefully while you are receiving this medicine. You will need important blood work done while you are taking this medicine. This drug may make you feel generally unwell. This is not uncommon, as chemotherapy can affect healthy cells as well as cancer cells. Report any side effects. Continue your course of treatment even though you feel ill unless your doctor tells you to stop. In some cases, you may be given additional medicines to help with side effects. Follow all directions for their use. You may get drowsy or dizzy. Do not drive, use machinery, or do anything that needs mental alertness until you know how this medicine affects you. Do not stand or sit up quickly, especially if you are an older patient. This reduces the risk of dizzy or fainting spells. Call your doctor or health care professional for advice if you get a fever, chills or sore throat, or other symptoms of a cold or flu. Do not treat yourself. This drug decreases your body's ability to fight infections. Try to avoid being around people who are sick. This medicine may increase your risk to bruise or bleed. Call your doctor or health care professional if you notice any unusual bleeding. Be careful brushing and flossing your teeth or using a toothpick because  you may get an infection or bleed more easily. If you have any dental work done, tell your dentist you are receiving this medicine. Avoid taking products that contain aspirin, acetaminophen, ibuprofen, naproxen, or ketoprofen unless instructed by your doctor. These medicines may hide a fever. Do not become pregnant while taking this medicine. Women should inform their doctor if they wish to become pregnant or think they might be pregnant. There is a potential for serious side effects to an unborn child. Talk to your health care professional or pharmacist for more information. Do not breast-feed an infant while taking this medicine. What side effects may I notice from receiving this medicine? Side effects that you should report to your doctor or health care professional as soon as possible: -allergic reactions like skin rash, itching or hives, swelling of the face, lips, or tongue -low blood counts - this medicine may decrease the number of white blood cells, red blood cells and platelets. You may be at increased risk for infections and bleeding. -signs of infection - fever or chills, cough, sore throat, pain or difficulty passing urine -signs of decreased platelets or bleeding - bruising, pinpoint red spots on the skin, black, tarry stools, blood in the urine -signs of decreased red blood cells - unusually weak or tired, fainting spells, lightheadedness -breathing problems -  chest pain -diarrhea -feeling faint or lightheaded, falls -flushing, runny nose, sweating during infusion -mouth sores or pain -pain, swelling, redness or irritation where injected -pain, swelling, warmth in the leg -pain, tingling, numbness in the hands or feet -problems with balance, talking, walking -stomach cramps, pain -trouble passing urine or change in the amount of urine -vomiting as to be unable to hold down drinks or food -yellowing of the eyes or skin Side effects that usually do not require medical attention  (report to your doctor or health care professional if they continue or are bothersome): -constipation -hair loss -headache -loss of appetite -nausea, vomiting -stomach upset This list may not describe all possible side effects. Call your doctor for medical advice about side effects. You may report side effects to FDA at 1-800-FDA-1088. Where should I keep my medicine? This drug is given in a hospital or clinic and will not be stored at home. NOTE: This sheet is a summary. It may not cover all possible information. If you have questions about this medicine, talk to your doctor, pharmacist, or health care provider.  2018 [employer]/Gold Standard (2012-11-06 16:29:32) Fluorouracil, 5-FU injection What is this medicine? FLUOROURACIL, 5-FU (flure oh YOOR a sil) is a chemotherapy drug. It slows the growth of cancer cells. This medicine is used to treat many types of cancer like breast cancer, colon or rectal cancer, pancreatic cancer, and stomach cancer. This medicine may be used for other purposes; ask your health care provider or pharmacist if you have questions. COMMON BRAND NAME(S): Adrucil What should I tell my health care provider before I take this medicine? They need to know if you have any of these conditions: -blood disorders -dihydropyrimidine dehydrogenase (DPD) deficiency -infection (especially a virus infection such as chickenpox, cold sores, or herpes) -kidney disease -liver disease -malnourished, poor nutrition -recent or ongoing radiation therapy -an unusual or allergic reaction to fluorouracil, other chemotherapy, other medicines, foods, dyes, or preservatives -pregnant or trying to get pregnant -breast-feeding How should I use this medicine? This drug is given as an infusion or injection into a vein. It is administered in a hospital or clinic by a specially trained health care professional. Talk to your pediatrician regarding the use of this medicine in children. Special  care may be needed. Overdosage: If you think you have taken too much of this medicine contact a poison control center or emergency room at once. NOTE: This medicine is only for you. Do not share this medicine with others. What if I miss a dose? It is important not to miss your dose. Call your doctor or health care professional if you are unable to keep an appointment. What may interact with this medicine? -allopurinol -cimetidine -dapsone -digoxin -hydroxyurea -leucovorin -levamisole -medicines for seizures like ethotoin, fosphenytoin, phenytoin -medicines to increase blood counts like filgrastim, pegfilgrastim, sargramostim -medicines that treat or prevent blood clots like warfarin, enoxaparin, and dalteparin -methotrexate -metronidazole -pyrimethamine -some other chemotherapy drugs like busulfan, cisplatin, estramustine, vinblastine -trimethoprim -trimetrexate -vaccines Talk to your doctor or health care professional before taking any of these medicines: -acetaminophen -aspirin -ibuprofen -ketoprofen -naproxen This list may not describe all possible interactions. Give your health care provider a list of all the medicines, herbs, non-prescription drugs, or dietary supplements you use. Also tell them if you smoke, drink alcohol, or use illegal drugs. Some items may interact with your medicine. What should I watch for while using this medicine? Visit your doctor for checks on your progress. This drug may make you feel generally  unwell. This is not uncommon, as chemotherapy can affect healthy cells as well as cancer cells. Report any side effects. Continue your course of treatment even though you feel ill unless your doctor tells you to stop. In some cases, you may be given additional medicines to help with side effects. Follow all directions for their use. Call your doctor or health care professional for advice if you get a fever, chills or sore throat, or other symptoms of a cold or  flu. Do not treat yourself. This drug decreases your body's ability to fight infections. Try to avoid being around people who are sick. This medicine may increase your risk to bruise or bleed. Call your doctor or health care professional if you notice any unusual bleeding. Be careful brushing and flossing your teeth or using a toothpick because you may get an infection or bleed more easily. If you have any dental work done, tell your dentist you are receiving this medicine. Avoid taking products that contain aspirin, acetaminophen, ibuprofen, naproxen, or ketoprofen unless instructed by your doctor. These medicines may hide a fever. Do not become pregnant while taking this medicine. Women should inform their doctor if they wish to become pregnant or think they might be pregnant. There is a potential for serious side effects to an unborn child. Talk to your health care professional or pharmacist for more information. Do not breast-feed an infant while taking this medicine. Men should inform their doctor if they wish to father a child. This medicine may lower sperm counts. Do not treat diarrhea with over the counter products. Contact your doctor if you have diarrhea that lasts more than 2 days or if it is severe and watery. This medicine can make you more sensitive to the sun. Keep out of the sun. If you cannot avoid being in the sun, wear protective clothing and use sunscreen. Do not use sun lamps or tanning beds/booths. What side effects may I notice from receiving this medicine? Side effects that you should report to your doctor or health care professional as soon as possible: -allergic reactions like skin rash, itching or hives, swelling of the face, lips, or tongue -low blood counts - this medicine may decrease the number of white blood cells, red blood cells and platelets. You may be at increased risk for infections and bleeding. -signs of infection - fever or chills, cough, sore throat, pain or  difficulty passing urine -signs of decreased platelets or bleeding - bruising, pinpoint red spots on the skin, black, tarry stools, blood in the urine -signs of decreased red blood cells - unusually weak or tired, fainting spells, lightheadedness -breathing problems -changes in vision -chest pain -mouth sores -nausea and vomiting -pain, swelling, redness at site where injected -pain, tingling, numbness in the hands or feet -redness, swelling, or sores on hands or feet -stomach pain -unusual bleeding Side effects that usually do not require medical attention (report to your doctor or health care professional if they continue or are bothersome): -changes in finger or toe nails -diarrhea -dry or itchy skin -hair loss -headache -loss of appetite -sensitivity of eyes to the light -stomach upset -unusually teary eyes This list may not describe all possible side effects. Call your doctor for medical advice about side effects. You may report side effects to FDA at 1-800-FDA-1088. Where should I keep my medicine? This drug is given in a hospital or clinic and will not be stored at home. NOTE: This sheet is a summary. It may not cover all  possible information. If you have questions about this medicine, talk to your doctor, pharmacist, or health care provider.  2018 [employer]/Gold Standard (2007-09-13 13:53:16) Leucovorin injection What is this medicine? LEUCOVORIN (loo koe VOR in) is used to prevent or treat the harmful effects of some medicines. This medicine is used to treat anemia caused by a low amount of folic acid in the body. It is also used with 5-fluorouracil (5-FU) to treat colon cancer. This medicine may be used for other purposes; ask your health care provider or pharmacist if you have questions. What should I tell my health care provider before I take this medicine? They need to know if you have any of these conditions: -anemia from low levels of vitamin B-12 in the blood -an  unusual or allergic reaction to leucovorin, folic acid, other medicines, foods, dyes, or preservatives -pregnant or trying to get pregnant -breast-feeding How should I use this medicine? This medicine is for injection into a muscle or into a vein. It is given by a health care professional in a hospital or clinic setting. Talk to your pediatrician regarding the use of this medicine in children. Special care may be needed. Overdosage: If you think you have taken too much of this medicine contact a poison control center or emergency room at once. NOTE: This medicine is only for you. Do not share this medicine with others. What if I miss a dose? This does not apply. What may interact with this medicine? -capecitabine -fluorouracil -phenobarbital -phenytoin -primidone -trimethoprim-sulfamethoxazole This list may not describe all possible interactions. Give your health care provider a list of all the medicines, herbs, non-prescription drugs, or dietary supplements you use. Also tell them if you smoke, drink alcohol, or use illegal drugs. Some items may interact with your medicine. What should I watch for while using this medicine? Your condition will be monitored carefully while you are receiving this medicine. This medicine may increase the side effects of 5-fluorouracil, 5-FU. Tell your doctor or health care professional if you have diarrhea or mouth sores that do not get better or that get worse. What side effects may I notice from receiving this medicine? Side effects that you should report to your doctor or health care professional as soon as possible: -allergic reactions like skin rash, itching or hives, swelling of the face, lips, or tongue -breathing problems -fever, infection -mouth sores -unusual bleeding or bruising -unusually weak or tired Side effects that usually do not require medical attention (report to your doctor or health care professional if they continue or are  bothersome): -constipation or diarrhea -loss of appetite -nausea, vomiting This list may not describe all possible side effects. Call your doctor for medical advice about side effects. You may report side effects to FDA at 1-800-FDA-1088. Where should I keep my medicine? This drug is given in a hospital or clinic and will not be stored at home. NOTE: This sheet is a summary. It may not cover all possible information. If you have questions about this medicine, talk to your doctor, pharmacist, or health care provider.  2018 [employer] Standard (2007-11-14 16:50:29) Pegfilgrastim injection What is this medicine? PEGFILGRASTIM (PEG fil gra stim) is a long-acting granulocyte colony-stimulating factor that stimulates the growth of neutrophils, a type of white blood cell important in the body's fight against infection. It is used to reduce the incidence of fever and infection in patients with certain types of cancer who are receiving chemotherapy that affects the bone marrow, and to increase survival after being  exposed to high doses of radiation. This medicine may be used for other purposes; ask your health care provider or pharmacist if you have questions. COMMON BRAND NAME(S): Neulasta What should I tell my health care provider before I take this medicine? They need to know if you have any of these conditions: -kidney disease -latex allergy -ongoing radiation therapy -sickle cell disease -skin reactions to acrylic adhesives (On-Body Injector only) -an unusual or allergic reaction to pegfilgrastim, filgrastim, other medicines, foods, dyes, or preservatives -pregnant or trying to get pregnant -breast-feeding How should I use this medicine? This medicine is for injection under the skin. If you get this medicine at home, you will be taught how to prepare and give the pre-filled syringe or how to use the On-body Injector. Refer to the patient Instructions for Use for detailed instructions. Use  exactly as directed. Tell your healthcare provider immediately if you suspect that the On-body Injector may not have performed as intended or if you suspect the use of the On-body Injector resulted in a missed or partial dose. It is important that you put your used needles and syringes in a special sharps container. Do not put them in a trash can. If you do not have a sharps container, call your pharmacist or healthcare provider to get one. Talk to your pediatrician regarding the use of this medicine in children. While this drug may be prescribed for selected conditions, precautions do apply. Overdosage: If you think you have taken too much of this medicine contact a poison control center or emergency room at once. NOTE: This medicine is only for you. Do not share this medicine with others. What if I miss a dose? It is important not to miss your dose. Call your doctor or health care professional if you miss your dose. If you miss a dose due to an On-body Injector failure or leakage, a new dose should be administered as soon as possible using a single prefilled syringe for manual use. What may interact with this medicine? Interactions have not been studied. Give your health care provider a list of all the medicines, herbs, non-prescription drugs, or dietary supplements you use. Also tell them if you smoke, drink alcohol, or use illegal drugs. Some items may interact with your medicine. This list may not describe all possible interactions. Give your health care provider a list of all the medicines, herbs, non-prescription drugs, or dietary supplements you use. Also tell them if you smoke, drink alcohol, or use illegal drugs. Some items may interact with your medicine. What should I watch for while using this medicine? You may need blood work done while you are taking this medicine. If you are going to need a MRI, CT scan, or other procedure, tell your doctor that you are using this medicine (On-Body  Injector only). What side effects may I notice from receiving this medicine? Side effects that you should report to your doctor or health care professional as soon as possible: -allergic reactions like skin rash, itching or hives, swelling of the face, lips, or tongue -dizziness -fever -pain, redness, or irritation at site where injected -pinpoint red spots on the skin -red or dark-brown urine -shortness of breath or breathing problems -stomach or side pain, or pain at the shoulder -swelling -tiredness -trouble passing urine or change in the amount of urine Side effects that usually do not require medical attention (report to your doctor or health care professional if they continue or are bothersome): -bone pain -muscle pain This list  may not describe all possible side effects. Call your doctor for medical advice about side effects. You may report side effects to FDA at 1-800-FDA-1088. Where should I keep my medicine? Keep out of the reach of children. Store pre-filled syringes in a refrigerator between 2 and 8 degrees C (36 and 46 degrees F). Do not freeze. Keep in carton to protect from light. Throw away this medicine if it is left out of the refrigerator for more than 48 hours. Throw away any unused medicine after the expiration date. NOTE: This sheet is a summary. It may not cover all possible information. If you have questions about this medicine, talk to your doctor, pharmacist, or health care provider.  2018 Elsevier/Gold Standard (2016-05-06 12:58:03)

## 2017-04-13 NOTE — Telephone Encounter (Signed)
Dr Adele Schilder from [facility] calling asking to speak to physician or his nurse regarding Neilan Calise [phone]

## 2017-04-13 NOTE — Telephone Encounter (Signed)
msg given to Dr. Brahmanday.  

## 2017-04-15 ENCOUNTER — Ambulatory Visit
Admission: RE | Admit: 2017-04-15 | Discharge: 2017-04-15 | Disposition: A | Discharge status: Home / Self Care | Payer: No Typology Code available for payment source | Source: Ambulatory Visit | Attending: Internal Medicine | Admitting: Internal Medicine

## 2017-04-15 DIAGNOSIS — C251 Malignant neoplasm of body of pancreas: Secondary | ICD-10-CM | POA: Diagnosis not present

## 2017-04-15 DIAGNOSIS — R918 Other nonspecific abnormal finding of lung field: Secondary | ICD-10-CM | POA: Insufficient documentation

## 2017-04-15 DIAGNOSIS — J439 Emphysema, unspecified: Secondary | ICD-10-CM | POA: Insufficient documentation

## 2017-04-15 MED ORDER — IOPAMIDOL (ISOVUE-300) INJECTION 61%
75.0000 mL | Freq: Once | INTRAVENOUS | Status: AC | PRN
  Administered 2017-04-15: 75 mL via INTRAVENOUS

## 2017-04-17 MED ORDER — CEFAZOLIN SODIUM-DEXTROSE 2-4 GM/100ML-% IV SOLN: 2.0000 g | INTRAVENOUS | Status: DC

## 2017-04-18 ENCOUNTER — Encounter
Admission: RE | Disposition: A | Discharge status: Home / Self Care | Payer: Self-pay | Source: Ambulatory Visit | Attending: Vascular Surgery

## 2017-04-18 ENCOUNTER — Ambulatory Visit
Admission: RE | Admit: 2017-04-18 | Discharge: 2017-04-18 | Disposition: A | Discharge status: Home / Self Care | Payer: No Typology Code available for payment source | Source: Ambulatory Visit | Attending: Vascular Surgery | Admitting: Vascular Surgery

## 2017-04-18 ENCOUNTER — Encounter: Payer: Self-pay | Admitting: *Deleted

## 2017-04-18 DIAGNOSIS — Z87891 Personal history of nicotine dependence: Secondary | ICD-10-CM | POA: Insufficient documentation

## 2017-04-18 DIAGNOSIS — Z800 Family history of malignant neoplasm of digestive organs: Secondary | ICD-10-CM | POA: Insufficient documentation

## 2017-04-18 DIAGNOSIS — G893 Neoplasm related pain (acute) (chronic): Secondary | ICD-10-CM

## 2017-04-18 DIAGNOSIS — K831 Obstruction of bile duct: Secondary | ICD-10-CM | POA: Diagnosis not present

## 2017-04-18 DIAGNOSIS — J45909 Unspecified asthma, uncomplicated: Secondary | ICD-10-CM

## 2017-04-18 DIAGNOSIS — C259 Malignant neoplasm of pancreas, unspecified: Secondary | ICD-10-CM

## 2017-04-18 DIAGNOSIS — C251 Malignant neoplasm of body of pancreas: Secondary | ICD-10-CM

## 2017-04-18 DIAGNOSIS — Z79899 Other long term (current) drug therapy: Secondary | ICD-10-CM

## 2017-04-18 HISTORY — PX: PORTA CATH INSERTION: CATH118285

## 2017-04-18 SURGERY — PORTA CATH INSERTION
Anesthesia: Moderate Sedation

## 2017-04-18 MED ORDER — FENTANYL CITRATE (PF) 100 MCG/2ML IJ SOLN
INTRAMUSCULAR | Status: AC
  Filled 2017-04-18: qty 2

## 2017-04-18 MED ORDER — CHLORHEXIDINE GLUCONATE CLOTH 2 % EX PADS: 6.0000 | MEDICATED_PAD | Freq: Once | CUTANEOUS | Status: DC

## 2017-04-18 MED ORDER — CEFAZOLIN SODIUM-DEXTROSE 2-4 GM/100ML-% IV SOLN
INTRAVENOUS | Status: AC
  Administered 2017-04-18: 2 g via INTRAVENOUS
  Filled 2017-04-18: qty 100

## 2017-04-18 MED ORDER — ONDANSETRON HCL 4 MG/2ML IJ SOLN: 4.0000 mg | Freq: Four times a day (QID) | INTRAMUSCULAR | Status: DC | PRN

## 2017-04-18 MED ORDER — HYDROMORPHONE HCL 1 MG/ML IJ SOLN: 1.0000 mg | Freq: Once | INTRAMUSCULAR | Status: DC | PRN

## 2017-04-18 MED ORDER — HEPARIN (PORCINE) IN NACL 2-0.9 UNIT/ML-% IJ SOLN
INTRAMUSCULAR | Status: AC
  Filled 2017-04-18: qty 500

## 2017-04-18 MED ORDER — SODIUM CHLORIDE 0.9 % IR SOLN
Freq: Once | Status: DC
  Filled 2017-04-18: qty 2

## 2017-04-18 MED ORDER — LIDOCAINE-EPINEPHRINE (PF) 1 %-1:200000 IJ SOLN
INTRAMUSCULAR | Status: AC
  Filled 2017-04-18: qty 30

## 2017-04-18 MED ORDER — SODIUM CHLORIDE 0.9 % IV SOLN
INTRAVENOUS | Status: DC
  Administered 2017-04-18: 08:00:00 via INTRAVENOUS

## 2017-04-18 MED ORDER — MIDAZOLAM HCL 5 MG/5ML IJ SOLN
INTRAMUSCULAR | Status: AC
Start: 2017-04-18 — End: ?
  Filled 2017-04-18: qty 5

## 2017-04-18 MED ORDER — CEFAZOLIN SODIUM-DEXTROSE 2-4 GM/100ML-% IV SOLN
2.0000 g | Freq: Once | INTRAVENOUS | Status: AC
  Administered 2017-04-18: 2 g via INTRAVENOUS

## 2017-04-18 MED ORDER — FENTANYL CITRATE (PF) 100 MCG/2ML IJ SOLN
INTRAMUSCULAR | Status: DC | PRN
  Administered 2017-04-18 (×2): 25 ug via INTRAVENOUS
  Administered 2017-04-18: 50 ug via INTRAVENOUS

## 2017-04-18 MED ORDER — MIDAZOLAM HCL 2 MG/2ML IJ SOLN
INTRAMUSCULAR | Status: DC | PRN
  Administered 2017-04-18: 1 mg via INTRAVENOUS
  Administered 2017-04-18: 2 mg via INTRAVENOUS
  Administered 2017-04-18: 1 mg via INTRAVENOUS

## 2017-04-18 SURGICAL SUPPLY — 8 items
CANNULA 5F STIFF (CANNULA) ×3 IMPLANT
KIT PORT POWER 8FR ISP CVUE (Miscellaneous) ×3 IMPLANT
PACK ANGIOGRAPHY (CUSTOM PROCEDURE TRAY) ×3 IMPLANT
PAD GROUND ADULT SPLIT (MISCELLANEOUS) ×3 IMPLANT
PENCIL ELECTRO HAND CTR (MISCELLANEOUS) ×3 IMPLANT
SUT MNCRL AB 4-0 PS2 18 (SUTURE) ×3 IMPLANT
SUT PROLENE 0 CT 1 30 (SUTURE) ×3 IMPLANT
SUTURE VIC 3-0 (SUTURE) ×3 IMPLANT

## 2017-04-18 NOTE — Progress Notes (Signed)
Pt sitting up in bed, drinking w/o difficulty in NAD, family at bedside.  Dr. Lucky Cowboy at bedside to discuss procedure.  Discharge and follow up instructions reviewed with pt and family who verbalize understanding.

## 2017-04-18 NOTE — Op Note (Signed)
      [facility]       Operative Note  Date: 04/18/2017  Preoperative diagnosis:  1. Pancreas cancer  Postoperative diagnosis:  Same as above  Procedures: #1. Ultrasound guidance for vascular access to the right internal jugular vein. #2. Fluoroscopic guidance for placement of catheter. #3. Placement of CT compatible Port-A-Cath, right internal jugular vein.  Surgeon: Leotis Pain, MD.   Anesthesia: Local with moderate conscious sedation for approximately 20  minutes using 4 mg of Versed and 100 mcg of Fentanyl  Fluoroscopy time: less than 1 minute  Contrast used: 0  Estimated blood loss: 5 cc  Indication for the procedure:  The patient is a 46 y.o.male with pancreas cancer.  The patient needs a Port-A-Cath for durable venous access, chemotherapy, lab draws, and CT scans. We are asked to place this. Risks and benefits were discussed and informed consent was obtained.  Description of procedure: The patient was brought to the vascular and interventional radiology suite.  Moderate conscious sedation was administered throughout the procedure during a face to face encounter with the patient with my supervision of the RN administering medicines and monitoring the patient's vital signs, pulse oximetry, telemetry and mental status throughout from the start of the procedure until the patient was taken to the recovery room. The right neck chest and shoulder were sterilely prepped and draped, and a sterile surgical field was created. Ultrasound was used to help visualize a patent right internal jugular vein. This was then accessed under direct ultrasound guidance without difficulty with the Seldinger needle and a permanent image was recorded. A J-wire was placed. After skin nick and dilatation, the peel-away sheath was then placed over the wire. I then anesthetized an area under the clavicle approximately 1-2 fingerbreadths. A transverse incision was created and an inferior pocket  was created with electrocautery and blunt dissection. The port was then brought onto the field, placed into the pocket and secured to the chest wall with 2 Prolene sutures. The catheter was connected to the port and tunneled from the subclavicular incision to the access site. Fluoroscopic guidance was then used to cut the catheter to an appropriate length. The catheter was then placed through the peel-away sheath and the peel-away sheath was removed. The catheter tip was parked in excellent location under fluorocoscopic guidance in the cavoatrial junction. The pocket was then irrigated with antibiotic impregnated saline and the wound was closed with a running 3-0 Vicryl and a 4-0 Monocryl. The access incision was closed with a single 4-0 Monocryl. The Huber needle was used to withdraw blood and flush the port with heparinized saline. Dermabond was then placed as a dressing. The patient tolerated the procedure well and was taken to the recovery room in stable condition.   Leotis Pain 04/18/2017 8:52 AM   This note was created with Dragon Medical transcription system. Any errors in dictation are purely unintentional.

## 2017-04-18 NOTE — Discharge Instructions (Signed)
Moderate Conscious Sedation, Adult, Care After °These instructions provide you with information about caring for yourself after your procedure. Your health care provider may also give you more specific instructions. Your treatment has been planned according to current medical practices, but problems sometimes occur. Call your health care provider if you have any problems or questions after your procedure. °What can I expect after the procedure? °After your procedure, it is common: °· To feel sleepy for several hours. °· To feel clumsy and have poor balance for several hours. °· To have poor judgment for several hours. °· To vomit if you eat too soon. ° °Follow these instructions at home: °For at least 24 hours after the procedure: ° °· Do not: °? Participate in activities where you could fall or become injured. °? Drive. °? Use heavy machinery. °? Drink alcohol. °? Take sleeping pills or medicines that cause drowsiness. °? Make important decisions or sign legal documents. °? Take care of children on your own. °· Rest. °Eating and drinking °· Follow the diet recommended by your health care provider. °· If you vomit: °? Drink water, juice, or soup when you can drink without vomiting. °? Make sure you have little or no nausea before eating solid foods. °General instructions °· Have a responsible adult stay with you until you are awake and alert. °· Take over-the-counter and prescription medicines only as told by your health care provider. °· If you smoke, do not smoke without supervision. °· Keep all follow-up visits as told by your health care provider. This is important. °Contact a health care provider if: °· You keep feeling nauseous or you keep vomiting. °· You feel light-headed. °· You develop a rash. °· You have a fever. °Get help right away if: °· You have trouble breathing. °This information is not intended to replace advice given to you by your health care provider. Make sure you discuss any questions you have  with your health care provider. °Document Released: 02/28/2013 Document Revised: 10/13/2015 Document Reviewed: 08/30/2015 °Elsevier Interactive Patient Education © 2018 Elsevier Inc. °Implanted Port Insertion, Care After °This sheet gives you information about how to care for yourself after your procedure. Your health care provider may also give you more specific instructions. If you have problems or questions, contact your health care provider. °What can I expect after the procedure? °After your procedure, it is common to have: °· Discomfort at the port insertion site. °· Bruising on the skin over the port. This should improve over 3-4 days. ° °Follow these instructions at home: °Port care °· After your port is placed, you will get a manufacturer's information card. The card has information about your port. Keep this card with you at all times. °· Take care of the port as told by your health care provider. Ask your health care provider if you or a family member can get training for taking care of the port at home. A home health care nurse may also take care of the port. °· Make sure to remember what type of port you have. °Incision care °· Follow instructions from your health care provider about how to take care of your port insertion site. Make sure you: °? Wash your hands with soap and water before you change your bandage (dressing). If soap and water are not available, use hand sanitizer. °? Change your dressing as told by your health care provider. °? Leave stitches (sutures), skin glue, or adhesive strips in place. These skin closures may need to stay   in place for 2 weeks or longer. If adhesive strip edges start to loosen and curl up, you may trim the loose edges. Do not remove adhesive strips completely unless your health care provider tells you to do that. °· Check your port insertion site every day for signs of infection. Check for: °? More redness, swelling, or pain. °? More fluid or  blood. °? Warmth. °? Pus or a bad smell. °General instructions °· Do not take baths, swim, or use a hot tub until your health care provider approves. °· Do not lift anything that is heavier than 10 lb (4.5 kg) for a week, or as told by your health care provider. °· Ask your health care provider when it is okay to: °? Return to work or school. °? Resume usual physical activities or sports. °· Do not drive for 24 hours if you were given a medicine to help you relax (sedative). °· Take over-the-counter and prescription medicines only as told by your health care provider. °· Wear a medical alert bracelet in case of an emergency. This will tell any health care providers that you have a port. °· Keep all follow-up visits as told by your health care provider. This is important. °Contact a health care provider if: °· You cannot flush your port with saline as directed, or you cannot draw blood from the port. °· You have a fever or chills. °· You have more redness, swelling, or pain around your port insertion site. °· You have more fluid or blood coming from your port insertion site. °· Your port insertion site feels warm to the touch. °· You have pus or a bad smell coming from the port insertion site. °Get help right away if: °· You have chest pain or shortness of breath. °· You have bleeding from your port that you cannot control. °Summary °· Take care of the port as told by your health care provider. °· Change your dressing as told by your health care provider. °· Keep all follow-up visits as told by your health care provider. °This information is not intended to replace advice given to you by your health care provider. Make sure you discuss any questions you have with your health care provider. °Document Released: 02/28/2013 Document Revised: 03/31/2016 Document Reviewed: 03/31/2016 °Elsevier Interactive Patient Education © 2017 Elsevier Inc. ° °

## 2017-04-18 NOTE — H&P (Signed)
[facility] History & Physical Update  The patient was interviewed and re-examined.  The patient's previous History and Physical has been reviewed and is unchanged.  There is no change in the plan of care. We plan to proceed with the scheduled procedure.  Rogers Ditter, MD  04/18/2017, 8:13 AM    

## 2017-04-19 ENCOUNTER — Other Ambulatory Visit (INDEPENDENT_AMBULATORY_CARE_PROVIDER_SITE_OTHER): Payer: Self-pay | Admitting: Vascular Surgery

## 2017-04-19 ENCOUNTER — Inpatient Hospital Stay: Payer: No Typology Code available for payment source

## 2017-04-21 ENCOUNTER — Other Ambulatory Visit: Payer: Self-pay

## 2017-04-21 ENCOUNTER — Inpatient Hospital Stay: Payer: No Typology Code available for payment source | Admitting: Anesthesiology

## 2017-04-21 ENCOUNTER — Inpatient Hospital Stay: Payer: No Typology Code available for payment source

## 2017-04-21 ENCOUNTER — Inpatient Hospital Stay
Admission: EM | Admit: 2017-04-21 | Discharge: 2017-04-22 | DRG: 444 | Disposition: A | Discharge status: Home / Self Care | Payer: No Typology Code available for payment source | Attending: Internal Medicine | Admitting: Internal Medicine

## 2017-04-21 ENCOUNTER — Telehealth: Payer: Self-pay | Admitting: *Deleted

## 2017-04-21 ENCOUNTER — Encounter
Admission: EM | Disposition: A | Discharge status: Home / Self Care | Payer: Self-pay | Source: Home / Self Care | Attending: Internal Medicine

## 2017-04-21 DIAGNOSIS — C259 Malignant neoplasm of pancreas, unspecified: Secondary | ICD-10-CM

## 2017-04-21 DIAGNOSIS — Z87891 Personal history of nicotine dependence: Secondary | ICD-10-CM

## 2017-04-21 DIAGNOSIS — J45909 Unspecified asthma, uncomplicated: Secondary | ICD-10-CM | POA: Diagnosis present

## 2017-04-21 DIAGNOSIS — K859 Acute pancreatitis without necrosis or infection, unspecified: Secondary | ICD-10-CM | POA: Diagnosis present

## 2017-04-21 DIAGNOSIS — Z79899 Other long term (current) drug therapy: Secondary | ICD-10-CM | POA: Diagnosis not present

## 2017-04-21 DIAGNOSIS — C801 Malignant (primary) neoplasm, unspecified: Secondary | ICD-10-CM | POA: Diagnosis not present

## 2017-04-21 DIAGNOSIS — R945 Abnormal results of liver function studies: Secondary | ICD-10-CM | POA: Diagnosis present

## 2017-04-21 DIAGNOSIS — K831 Obstruction of bile duct: Secondary | ICD-10-CM

## 2017-04-21 DIAGNOSIS — G893 Neoplasm related pain (acute) (chronic): Secondary | ICD-10-CM

## 2017-04-21 DIAGNOSIS — C250 Malignant neoplasm of head of pancreas: Secondary | ICD-10-CM | POA: Diagnosis present

## 2017-04-21 DIAGNOSIS — Z800 Family history of malignant neoplasm of digestive organs: Secondary | ICD-10-CM

## 2017-04-21 HISTORY — PX: ERCP: SHX5425

## 2017-04-21 LAB — COMPREHENSIVE METABOLIC PANEL
ALBUMIN: 3.7 g/dL (ref 3.5–5.0)
ALT: 711 U/L — ABNORMAL HIGH (ref 17–63)
AST: 336 U/L — AB (ref 15–41)
Alkaline Phosphatase: 523 U/L — ABNORMAL HIGH (ref 38–126)
Anion gap: 13 (ref 5–15)
BUN: 9 mg/dL (ref 6–20)
CHLORIDE: 101 mmol/L (ref 101–111)
CO2: 23 mmol/L (ref 22–32)
Calcium: 9.7 mg/dL (ref 8.9–10.3)
Creatinine, Ser: 0.46 mg/dL — ABNORMAL LOW (ref 0.61–1.24)
GFR calc Af Amer: >60 mL/min (ref >60)
GFR calc non Af Amer: >60 mL/min (ref >60)
Glucose, Bld: 112 mg/dL — ABNORMAL HIGH (ref 65–99)
POTASSIUM: 3.6 mmol/L (ref 3.5–5.1)
Sodium: 137 mmol/L (ref 135–145)
Total Bilirubin: 14.8 mg/dL — ABNORMAL HIGH (ref 0.3–1.2)
Total Protein: 8.2 g/dL — ABNORMAL HIGH (ref 6.5–8.1)

## 2017-04-21 LAB — LIPASE, BLOOD: LIPASE: 71 U/L — AB (ref 11–51)

## 2017-04-21 LAB — URINALYSIS, COMPLETE (UACMP) WITH MICROSCOPIC
BACTERIA UA: NONE SEEN
Glucose, UA: NEGATIVE mg/dL
Ketones, ur: 5 mg/dL — AB
Leukocytes, UA: NEGATIVE
NITRITE: NEGATIVE
PROTEIN: 30 mg/dL — AB
SPECIFIC GRAVITY, URINE: 1.027 (ref 1.005–1.030)
SQUAMOUS EPITHELIAL / LPF: NONE SEEN
pH: 5 (ref 5.0–8.0)

## 2017-04-21 LAB — CBC
HEMATOCRIT: 40.2 % (ref 40.0–52.0)
HEMOGLOBIN: 14.2 g/dL (ref 13.0–18.0)
MCH: 31.2 pg (ref 26.0–34.0)
MCHC: 35.2 g/dL (ref 32.0–36.0)
MCV: 88.7 fL (ref 80.0–100.0)
Platelets: 337 10*3/uL (ref 150–440)
RBC: 4.54 MIL/uL (ref 4.40–5.90)
RDW: 15.2 % — AB (ref 11.5–14.5)
WBC: 6.7 10*3/uL (ref 3.8–10.6)

## 2017-04-21 SURGERY — ERCP, WITH INTERVENTION IF INDICATED
Anesthesia: General

## 2017-04-21 MED ORDER — FENTANYL 25 MCG/HR TD PT72: 25.0000 ug | MEDICATED_PATCH | TRANSDERMAL | Status: DC

## 2017-04-21 MED ORDER — HYDROMORPHONE HCL 1 MG/ML IJ SOLN
1.0000 mg | Freq: Once | INTRAMUSCULAR | Status: AC
  Administered 2017-04-21: 1 mg via INTRAVENOUS
  Filled 2017-04-21: qty 1

## 2017-04-21 MED ORDER — FENTANYL CITRATE (PF) 100 MCG/2ML IJ SOLN
INTRAMUSCULAR | Status: DC | PRN
  Administered 2017-04-21 (×3): 50 ug via INTRAVENOUS

## 2017-04-21 MED ORDER — FENTANYL CITRATE (PF) 100 MCG/2ML IJ SOLN
INTRAMUSCULAR | Status: AC
  Filled 2017-04-21: qty 2

## 2017-04-21 MED ORDER — SODIUM CHLORIDE 0.9 % IV BOLUS (SEPSIS)
1000.0000 mL | Freq: Once | INTRAVENOUS | Status: AC
  Administered 2017-04-21: 1000 mL via INTRAVENOUS

## 2017-04-21 MED ORDER — MIDAZOLAM HCL 2 MG/2ML IJ SOLN
INTRAMUSCULAR | Status: DC | PRN
  Administered 2017-04-21: 2 mg via INTRAVENOUS

## 2017-04-21 MED ORDER — HYDROCODONE-ACETAMINOPHEN 5-325 MG PO TABS
1.0000 | ORAL_TABLET | ORAL | Status: DC | PRN
  Administered 2017-04-21: 2 via ORAL
  Filled 2017-04-21: qty 2

## 2017-04-21 MED ORDER — ONDANSETRON HCL 4 MG PO TABS: 4.0000 mg | ORAL_TABLET | Freq: Four times a day (QID) | ORAL | Status: DC | PRN

## 2017-04-21 MED ORDER — INDOMETHACIN 50 MG RE SUPP
100.0000 mg | Freq: Once | RECTAL | Status: AC
  Administered 2017-04-21: 14:00:00 via RECTAL

## 2017-04-21 MED ORDER — MIDAZOLAM HCL 2 MG/2ML IJ SOLN
INTRAMUSCULAR | Status: AC
  Filled 2017-04-21: qty 2

## 2017-04-21 MED ORDER — IPRATROPIUM-ALBUTEROL 0.5-2.5 (3) MG/3ML IN SOLN
3.0000 mL | Freq: Once | RESPIRATORY_TRACT | Status: AC
  Administered 2017-04-21: 3 mL via RESPIRATORY_TRACT

## 2017-04-21 MED ORDER — PROPOFOL 500 MG/50ML IV EMUL
INTRAVENOUS | Status: AC
  Filled 2017-04-21: qty 50

## 2017-04-21 MED ORDER — ALBUTEROL SULFATE (2.5 MG/3ML) 0.083% IN NEBU: 2.5000 mg | INHALATION_SOLUTION | Freq: Four times a day (QID) | RESPIRATORY_TRACT | Status: DC | PRN

## 2017-04-21 MED ORDER — SODIUM CHLORIDE 0.9 % IV SOLN: INTRAVENOUS | Status: DC

## 2017-04-21 MED ORDER — SODIUM CHLORIDE 0.9 % IV SOLN
INTRAVENOUS | Status: DC
  Administered 2017-04-21: 14:00:00 via INTRAVENOUS

## 2017-04-21 MED ORDER — ONDANSETRON HCL 4 MG/2ML IJ SOLN
4.0000 mg | Freq: Once | INTRAMUSCULAR | Status: AC
  Administered 2017-04-21: 4 mg via INTRAVENOUS
  Filled 2017-04-21: qty 2

## 2017-04-21 MED ORDER — ENOXAPARIN SODIUM 40 MG/0.4ML ~~LOC~~ SOLN: 40.0000 mg | SUBCUTANEOUS | Status: DC

## 2017-04-21 MED ORDER — NEOSTIGMINE METHYLSULFATE 10 MG/10ML IV SOLN
INTRAVENOUS | Status: DC | PRN
  Administered 2017-04-21: 2 mg via INTRAVENOUS

## 2017-04-21 MED ORDER — GLYCOPYRROLATE 0.2 MG/ML IJ SOLN
INTRAMUSCULAR | Status: DC | PRN
  Administered 2017-04-21: 0.4 mg via INTRAVENOUS

## 2017-04-21 MED ORDER — ONDANSETRON HCL 4 MG/2ML IJ SOLN: 4.0000 mg | Freq: Four times a day (QID) | INTRAMUSCULAR | Status: DC | PRN

## 2017-04-21 MED ORDER — LIDOCAINE HCL (CARDIAC) 20 MG/ML IV SOLN
INTRAVENOUS | Status: DC | PRN
  Administered 2017-04-21: 30 mg via INTRAVENOUS

## 2017-04-21 MED ORDER — SUCCINYLCHOLINE CHLORIDE 20 MG/ML IJ SOLN
INTRAMUSCULAR | Status: DC | PRN
  Administered 2017-04-21: 100 mg via INTRAVENOUS

## 2017-04-21 MED ORDER — DOCUSATE SODIUM 100 MG PO CAPS
100.0000 mg | ORAL_CAPSULE | Freq: Two times a day (BID) | ORAL | Status: DC
  Administered 2017-04-21 – 2017-04-22 (×2): 100 mg via ORAL
  Filled 2017-04-21 (×2): qty 1

## 2017-04-21 MED ORDER — ROCURONIUM BROMIDE 100 MG/10ML IV SOLN
INTRAVENOUS | Status: DC | PRN
  Administered 2017-04-21: 15 mg via INTRAVENOUS
  Administered 2017-04-21: 5 mg via INTRAVENOUS

## 2017-04-21 MED ORDER — ACETAMINOPHEN 325 MG PO TABS: 650.0000 mg | ORAL_TABLET | Freq: Four times a day (QID) | ORAL | Status: DC | PRN

## 2017-04-21 MED ORDER — ACETAMINOPHEN 650 MG RE SUPP
650.0000 mg | Freq: Four times a day (QID) | RECTAL | Status: DC | PRN
  Filled 2017-04-21: qty 1

## 2017-04-21 MED ORDER — ONDANSETRON HCL 4 MG PO TABS: 4.0000 mg | ORAL_TABLET | Freq: Three times a day (TID) | ORAL | Status: DC | PRN

## 2017-04-21 MED ORDER — LIDOCAINE HCL (PF) 2 % IJ SOLN
INTRAMUSCULAR | Status: AC
  Filled 2017-04-21: qty 10

## 2017-04-21 MED ORDER — BISACODYL 5 MG PO TBEC: 5.0000 mg | DELAYED_RELEASE_TABLET | Freq: Every day | ORAL | Status: DC | PRN

## 2017-04-21 MED ORDER — PROPOFOL 10 MG/ML IV BOLUS
INTRAVENOUS | Status: DC | PRN
  Administered 2017-04-21: 100 mg via INTRAVENOUS

## 2017-04-21 MED ORDER — INDOMETHACIN 50 MG RE SUPP
RECTAL | Status: AC
  Filled 2017-04-21: qty 2

## 2017-04-21 MED ORDER — IPRATROPIUM-ALBUTEROL 0.5-2.5 (3) MG/3ML IN SOLN
RESPIRATORY_TRACT | Status: AC
  Administered 2017-04-21: 3 mL via RESPIRATORY_TRACT
  Filled 2017-04-21: qty 3

## 2017-04-21 MED ORDER — FENTANYL 25 MCG/HR TD PT72
25.0000 ug | MEDICATED_PATCH | TRANSDERMAL | Status: DC
  Administered 2017-04-21: 17:00:00 25 ug via TRANSDERMAL
  Filled 2017-04-21: qty 1

## 2017-04-21 NOTE — Anesthesia Post-op Follow-up Note (Signed)
Anesthesia QCDR form completed.        

## 2017-04-21 NOTE — Transfer of Care (Signed)
  Immediate Anesthesia Transfer of Care Note  Patient: Nathaniel Saunders  Procedure(s) Performed: ENDOSCOPIC RETROGRADE CHOLANGIOPANCREATOGRAPHY (ERCP) (N/A )  Patient Location: PACU  Anesthesia Type:General  Level of Consciousness: awake, alert  and sedated  Airway & Oxygen Therapy: Patient Spontanous Breathing and Patient connected to nasal cannula oxygen  Post-op Assessment: Report given to RN and Post -op Vital signs reviewed and stable  Post vital signs: Reviewed and stable  Last Vitals:  Vitals:   04/21/17 1130 04/21/17 1300  BP: 119/78 104/66  Pulse: 60 (!) 56  Resp:  19  Temp:    SpO2: 100% 97%    Last Pain:  Vitals:   04/21/17 1225  TempSrc:   PainSc: 0-No pain         Complications: No apparent anesthesia complications

## 2017-04-21 NOTE — H&P (Signed)
[facility] [place] NAME: Kavish Lafitte    MR#:  [id number]  DATE OF BIRTH:  1971-01-14  DATE OF ADMISSION:  04/21/2017  PRIMARY CARE PHYSICIAN: Patient, No Pcp Per   REQUESTING/REFERRING PHYSICIAN: Dr. Corky Downs  CHIEF COMPLAINT: Jaundice   Chief Complaint  Patient presents with  . Abdominal Pain    HISTORY OF PRESENT ILLNESS:  Shelly Poellnitz  is a 46 y.o. male with recently diagnosed pancreatic cancer supposed to start chemotherapy tomorrow comes in because of jaundice, abdominal pain.  Patient has been having yellow discoloration of ice for the past 3-4 days associated with nausea, vomiting, worsening epigastric pain.  Patient found to have active jaundice with total bilirubin 14.8, alk phos 523, elevated AST, ALT.  Scheduled to have ERCP by Dr. Lucilla Lame today.  PAST MEDICAL HISTORY:   Past Medical History:  Diagnosis Date  . Asthma   . Pancreatic mass   . Pancreatic mass     PAST SURGICAL HISTOIRY:   Past Surgical History:  Procedure Laterality Date  . PORTA CATH INSERTION N/A 04/18/2017   Procedure: PORTA CATH INSERTION;  Surgeon: Algernon Huxley, MD;  Location: [facility];  Service: Cardiovascular;  Laterality: N/A;    SOCIAL HISTORY:   Social History   Tobacco Use  . Smoking status: Former Smoker    Packs/day: 0.25    Years: 5.00    Pack years: 1.25    Types: Cigarettes  . Smokeless tobacco: Never Used  Substance Use Topics  . Alcohol use: No    FAMILY HISTORY:   Family History  Problem Relation Age of Onset  . Pancreatic cancer Mother 31  . Pancreatic cancer Maternal Grandmother 60    DRUG ALLERGIES:  No Known Allergies  REVIEW OF SYSTEMS:  CONSTITUTIONAL: No fever, fatigue or weakness.  EYES: No blurred or double vision.  EARS, NOSE, AND THROAT: No tinnitus or ear pain.  RESPIRATORY: No cough, shortness of breath, wheezing or hemoptysis.  CARDIOVASCULAR: No chest pain, orthopnea,  edema.  GASTROINTESTINAL:nausea,vomiting, abdominal pain, jaundice.   GENITOURINARY: No dysuria, hematuria.  ENDOCRINE: No polyuria, nocturia,  HEMATOLOGY: No anemia, easy bruising or bleeding SKIN: No rash or lesion. MUSCULOSKELETAL: No joint pain or arthritis.   NEUROLOGIC: No tingling, numbness, weakness.  PSYCHIATRY: No anxiety or depression.   MEDICATIONS AT HOME:   Prior to Admission medications   Medication Sig Start Date End Date Taking? Authorizing Provider  albuterol (PROVENTIL HFA;VENTOLIN HFA) 108 (90 Base) MCG/ACT inhaler Inhale 1-2 puffs every 6 (six) hours as needed into the lungs for wheezing or shortness of breath.    [provider]  fentaNYL (DURAGESIC - DOSED MCG/HR) 25 MCG/HR patch Place 1 patch (25 mcg total) every 3 (three) days onto the skin. Patient taking differently: Place 25 mcg every three (3) days as needed onto the skin (for severe pain.).  04/05/17   Cammie Sickle, MD  lidocaine-prilocaine (EMLA) cream Apply 1 application topically as needed. Apply generously over the Mediport 45 minutes prior to chemotherapy. 04/12/17   Cammie Sickle, MD  ondansetron (ZOFRAN) 8 MG tablet One pill every 8 hours as needed for nausea/vomitting. 04/12/17   Cammie Sickle, MD  oxyCODONE-acetaminophen (ROXICET) 5-325 MG tablet 6- 8 hours as needed for pain Patient taking differently: Take 1 tablet every 6 (six) hours as needed by mouth (for pain.).  03/24/17   Cammie Sickle, MD  prochlorperazine (COMPAZINE) 10 MG tablet Take 1  tablet (10 mg total) by mouth every 6 (six) hours as needed for nausea or vomiting. 04/12/17   Cammie Sickle, MD      VITAL SIGNS:  Blood pressure (!) 120/98, pulse 92, temperature 98 F (36.7 C), resp. rate 20, height _0  (1.6 m), weight 59 kg (130 lb), SpO2 100 %.  PHYSICAL EXAMINATION:  GENERAL:  46 y.o.-year-old patient lying in the bed with no acute distress.  EYES: Pupils equal, round, reactive to  light  accommodation. has scleral icterus. Extraocular muscles intact.  HEENT: Head atraumatic, normocephalic. Oropharynx and nasopharynx clear.  NECK:  Supple, no jugular venous distention. No thyroid enlargement, no tenderness.  LUNGS: Normal breath sounds bilaterally, no wheezing, rales,rhonchi or crepitation. No use of accessory muscles of respiration.  CARDIOVASCULAR: S1, S2 normal. No murmurs, rubs, or gallops.  ABDOMEN: Epigastric abdominal pain.  No CVA tenderness, no distention. EXTREMITIES: No pedal edema, cyanosis, or clubbing.  NEUROLOGIC: Cranial nerves II through XII are intact. Muscle strength 5/5 in all extremities. Sensation intact. Gait not checked.  PSYCHIATRIC: The patient is alert and oriented x 3.  SKIN: No obvious rash, lesion, or ulcer.   LABORATORY PANEL:   CBC Recent Labs  Lab 04/21/17 1058  WBC 6.7  HGB 14.2  HCT 40.2  PLT 337   ------------------------------------------------------------------------------------------------------------------  Chemistries  Recent Labs  Lab 04/21/17 1058  NA 137  K 3.6  CL 101  CO2 23  GLUCOSE 112*  BUN 9  CREATININE 0.46*  CALCIUM 9.7  AST 336*  ALT 711*  ALKPHOS 523*  BILITOT 14.8*   ------------------------------------------------------------------------------------------------------------------  Cardiac Enzymes No results for input(s): TROPONINI in the last 168 hours. ------------------------------------------------------------------------------------------------------------------  RADIOLOGY:  Dg C-arm 1-60 Min-no Report  Result Date: 04/21/2017 Fluoroscopy was utilized by the requesting physician.  No radiographic interpretation.    EKG:   Orders placed or performed during the hospital encounter of 03/23/17  . ED EKG  . ED EKG    IMPRESSION AND PLAN:   46 year old male with recently diagnosed pancreatic cancer plus jaundice, abdominal pain suspicious for obstructive jaundice.  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  Observe in medical floor overnight, far posterior ERCP pancreatitis, consulted oncology treatment of pancreatic cancer, continue clear liquid diet, follow LFTs, lipase tomorrow.  All the records are reviewed and case discussed with ED provider. Management plans discussed with the patient, family and they are in agreement.  CODE STATUS: full  TOTAL TIME TAKING CARE OF THIS PATIENT: 55 minutes.    Epifanio Lesches M.D on 04/21/2017 at 3:07 PM  Between 7am to 6pm - Pager - [phone]  After 6pm go to [email or web address] - password [email or web address] [facility]  Office  [phone]  CC: Primary care physician; Patient, No Pcp Per  Note: This dictation was prepared with Dragon dictation along with smaller phrase technology. Any transcriptional errors that result from this process are unintentional.

## 2017-04-21 NOTE — ED Provider Notes (Signed)
[facility] Emergency Department Provider Note   ____________________________________________    I have reviewed the triage vital signs and the nursing notes.   HISTORY  Chief Complaint Abdominal Pain     HPI Nathaniel Saunders is a 46 y.o. male who presents with epigastric abdominal pain nausea and vomiting.  Patient is currently scheduled for initial dose of chemotherapy tomorrow morning for pancreatic cancer.  Over the last week he has noticed yellowing of his eyes and then developed nausea vomiting and worsening epigastric pain over the last several days.  He has been unable to tolerate p.o.'s.  Past Medical History:  Diagnosis Date  . Asthma   . Pancreatic mass   . Pancreatic mass     Patient Active Problem List   Diagnosis Date Noted  . Malignant tumor of body of pancreas ([place]) 03/24/2017    Past Surgical History:  Procedure Laterality Date  . PORTA CATH INSERTION N/A 04/18/2017   Procedure: PORTA CATH INSERTION;  Surgeon: Algernon Huxley, MD;  Location: [place] [facility];  Service: Cardiovascular;  Laterality: N/A;    Prior to Admission medications   Medication Sig Start Date End Date Taking? Authorizing Provider  albuterol (PROVENTIL HFA;VENTOLIN HFA) 108 (90 Base) MCG/ACT inhaler Inhale 1-2 puffs every 6 (six) hours as needed into the lungs for wheezing or shortness of breath.    [provider]  fentaNYL (DURAGESIC - DOSED MCG/HR) 25 MCG/HR patch Place 1 patch (25 mcg total) every 3 (three) days onto the skin. Patient taking differently: Place 25 mcg every three (3) days as needed onto the skin (for severe pain.).  04/05/17   Cammie Sickle, MD  lidocaine-prilocaine (EMLA) cream Apply 1 application topically as needed. Apply generously over the Mediport 45 minutes prior to chemotherapy. 04/12/17   Cammie Sickle, MD  ondansetron (ZOFRAN) 8 MG tablet One pill every 8 hours as needed for nausea/vomitting.  04/12/17   Cammie Sickle, MD  oxyCODONE-acetaminophen (ROXICET) 5-325 MG tablet 6- 8 hours as needed for pain Patient taking differently: Take 1 tablet every 6 (six) hours as needed by mouth (for pain.).  03/24/17   Cammie Sickle, MD  prochlorperazine (COMPAZINE) 10 MG tablet Take 1 tablet (10 mg total) by mouth every 6 (six) hours as needed for nausea or vomiting. 04/12/17   Cammie Sickle, MD     Allergies Patient has no known allergies.  Family History  Problem Relation Age of Onset  . Pancreatic cancer Mother 69  . Pancreatic cancer Maternal Grandmother 25    Social History Social History   Tobacco Use  . Smoking status: Former Smoker    Packs/day: 0.25    Years: 5.00    Pack years: 1.25    Types: Cigarettes  . Smokeless tobacco: Never Used  Substance Use Topics  . Alcohol use: No  . Drug use: No    Review of Systems  Constitutional: No fever/chills Eyes: Yellowish discoloration of eyes ENT: No sore throat. Cardiovascular: Denies chest pain. Respiratory: Denies shortness of breath. Gastrointestinal: Epigastric abdominal pain, positive nausea positive vomiting unable to tolerate p.o.'s Genitourinary: Negative for dysuria. Musculoskeletal: Negative for back pain. Skin: No rash Neurological: Negative for headaches    ____________________________________________   PHYSICAL EXAM:  VITAL SIGNS: ED Triage Vitals [04/21/17 1054]  Enc Vitals Group     BP 140/85     Pulse Rate 61     Resp 17     Temp 97.7 F (  36.5 C)     Temp Source Oral     SpO2 100 %     Weight 59 kg (130 lb)     Height 1.6 m (5\' 3" )     Head Circumference      Peak Flow      Pain Score 8     Pain Loc      Pain Edu?      Excl. in [place]?     Constitutional: Alert and oriented. No acute distress. Pleasant and interactive Eyes: Anicteric Head: Atraumatic. Nose: No congestion/rhinnorhea. Mouth/Throat: Mucous membranes are moist.   Neck:  Painless  ROM Cardiovascular: Normal rate, regular rhythm. Grossly normal heart sounds.  Good peripheral circulation. Respiratory: Normal respiratory effort.  No retractions. Lungs CTAB. Gastrointestinal: Mild tenderness in the epigastrium.  No distention.  No CVA tenderness. Genitourinary: deferred Musculoskeletal:   Warm and well perfused Neurologic:  Normal speech and language. No gross focal neurologic deficits are appreciated.  Skin:  Skin is warm, dry and intact. No rash noted. Psychiatric: Mood and affect are normal. Speech and behavior are normal.  ____________________________________________   LABS (all labs ordered are listed, but only abnormal results are displayed)  Labs Reviewed  LIPASE, BLOOD - Abnormal; Notable for the following components:      Result Value   Lipase 71 (*)    All other components within normal limits  COMPREHENSIVE METABOLIC PANEL - Abnormal; Notable for the following components:   Glucose, Bld 112 (*)    Creatinine, Ser 0.46 (*)    Total Protein 8.2 (*)    AST 336 (*)    ALT 711 (*)    Alkaline Phosphatase 523 (*)    Total Bilirubin 14.8 (*)    All other components within normal limits  CBC - Abnormal; Notable for the following components:   RDW 15.2 (*)    All other components within normal limits  URINALYSIS, COMPLETE (UACMP) WITH MICROSCOPIC - Abnormal; Notable for the following components:   Color, Urine AMBER (*)    APPearance CLEAR (*)    Hgb urine dipstick SMALL (*)    Bilirubin Urine MODERATE (*)    Ketones, ur 5 (*)    Protein, ur 30 (*)    All other components within normal limits   ____________________________________________  EKG  None ____________________________________________  RADIOLOGY  Reviewed recent CT scan of the pancreas ____________________________________________   PROCEDURES  Procedure(s) performed: No  Procedures   Critical Care performed: yes  CRITICAL CARE Performed by: Lavonia Drafts   Total  critical care time: 35 minutes  Critical care time was exclusive of separately billable procedures and treating other patients.  Critical care was necessary to treat or prevent imminent or life-threatening deterioration.  Critical care was time spent personally by me on the following activities: development of treatment plan with patient and/or surrogate as well as nursing, discussions with consultants, evaluation of patient's response to treatment, examination of patient, obtaining history from patient or surrogate, ordering and performing treatments and interventions, ordering and review of laboratory studies, ordering and review of radiographic studies, pulse oximetry and re-evaluation of patient's condition.  ____________________________________________   INITIAL IMPRESSION / ASSESSMENT AND PLAN / ED COURSE  Pertinent labs & imaging results that were available during my care of the patient were reviewed by me and considered in my medical decision making (see chart for details).  Patient presents with epigastric abdominal pain and jaundice with a history of pancreatic cancer.  Reviewed prior medical record and  oncology notes.  Strongly suspicious for obstructive extrahepatic biliary disease.  Lab work is significant for elevated AST ALT and a bilirubin of nearly 15.  Discussed with Dr. Rogue Bussing of oncology.  Consult to Dr. Allen Norris of gastroenterology who will take the patient for ERCP today at 330.  Patient is NPO and approves of plan    ____________________________________________   FINAL CLINICAL IMPRESSION(S) / ED DIAGNOSES  Final diagnoses:  Extrahepatic obstructive biliary disease  Malignant neoplasm of pancreas, unspecified location of malignancy [facility])        Note:  This document was prepared using Dragon voice recognition software and may include unintentional dictation errors.    Lavonia Drafts, MD 04/21/17 1308

## 2017-04-21 NOTE — Telephone Encounter (Signed)
Family called to report that patient has not been doing well for several days. He is having sever pain, weakness, not eating or getting out of bed. This morning, he is vomiting blood. Discussed with Lorretta Harp, NP and Vickki Muff , RN for Dr Rogue Bussing and was instructed to have patient go to ER. Informed Aniceto Boss of this and she has agreed to take him to the ER

## 2017-04-21 NOTE — Anesthesia Procedure Notes (Signed)
Procedure Name: Intubation Performed by: Vaughan Sine Pre-anesthesia Checklist: Patient identified, Emergency Drugs available, Suction available, Patient being monitored and Timeout performed Patient Re-evaluated:Patient Re-evaluated prior to induction Oxygen Delivery Method: Circle system utilized Preoxygenation: Pre-oxygenation with 100% oxygen Induction Type: IV induction Ventilation: Mask ventilation without difficulty Laryngoscope Size: Miller and 3 Grade View: Grade I Tube type: Oral Tube size: 7.0 mm Number of attempts: 1 Airway Equipment and Method: Stylet and Bite block

## 2017-04-21 NOTE — Op Note (Addendum)
[facility] Patient Name: Nathaniel Saunders Procedure Date: 04/21/2017 1:50 PM MRN: [id number] Account #: [id number] Date of Birth: 16-Nov-1970 Admit Type: Outpatient Age: 46 Room: [facility] ENDO ROOM 4 Gender: Male Note Status: Finalized Procedure:            ERCP Indications:          Malignant tumor of the head of pancreas Providers:            Lucilla Lame MD, MD Referring MD:         No Local Md, MD (Referring MD) Medicines:            General Anesthesia Complications:        No immediate complications. Procedure:            Pre-Anesthesia Assessment:                       - Prior to the procedure, a History and Physical was                        performed, and patient medications and allergies were                        reviewed. The patient's tolerance of previous                        anesthesia was also reviewed. The risks and benefits of                        the procedure and the sedation options and risks were                        discussed with the patient. All questions were                        answered, and informed consent was obtained. Prior                        Anticoagulants: The patient has taken no previous                        anticoagulant or antiplatelet agents. ASA Grade                        Assessment: II - A patient with mild systemic disease.                        After reviewing the risks and benefits, the patient was                        deemed in satisfactory condition to undergo the                        procedure.                       After obtaining informed consent, the scope was passed                        under direct vision. Throughout the procedure, the  patient's blood pressure, pulse, and oxygen saturations                        were monitored continuously. The Endoscope was                        introduced through the mouth, and used to inject   contrast into and used to cannulate the bile duct. The                        ERCP was accomplished without difficulty. The patient                        tolerated the procedure well. Findings:      The scout film was normal. The esophagus was successfully intubated       under direct vision. The scope was advanced to a normal major papilla in       the descending duodenum without detailed examination of the pharynx,       larynx and associated structures, and upper GI tract. The upper GI tract       was grossly normal. The bile duct was deeply cannulated with the       short-nosed traction sphincterotome. Contrast was injected. I personally       interpreted the bile duct images. There was brisk flow of contrast       through the ducts. Image quality was excellent. Contrast extended to the       hepatic ducts. The lower third of the main bile duct contained a single       localized stenosis. The main bile duct was diffusely dilated. A wire was       passed into the biliary tree. An 8 mm biliary sphincterotomy was made       with a sphincterotome using ERBE electrocautery. There was no       post-sphincterotomy bleeding. One 10 Fr by 7 cm plastic stent with a       single external flap and a single internal flap was placed 6 cm into the       common bile duct. Bile flowed through the stent. The stent was in good       position. 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                       - The entire main bile duct was dilated.                       - A biliary sphincterotomy was performed.                       - One plastic stent was placed into the common bile                        duct. Recommendation:       - Admit the patient to hospital ward for ongoing care.                       - Clear liquid diet. Procedure Code(s):    --- Professional ---  76160, Endoscopic retrograde cholangiopancreatography                        (ERCP); with  placement of endoscopic stent into biliary                        or pancreatic duct, including pre- and post-dilation                        and guide wire passage, when performed, including                        sphincterotomy, when performed, each stent                       73710, Endoscopic catheterization of the biliary ductal                        system, radiological supervision and interpretation Diagnosis Code(s):    --- Professional ---                       C25.0, Malignant neoplasm of head of pancreas                       K83.8, Other specified diseases of biliary tract                       K83.1, Obstruction of bile duct CPT copyright 2016 American Medical Association. All rights reserved. The codes documented in this report are preliminary and upon coder review may  be revised to meet current compliance requirements. Lucilla Lame MD, MD 04/21/2017 2:39:41 PM This report has been signed electronically. Number of Addenda: 0 Note Initiated On: 04/21/2017 1:50 PM      [facility]

## 2017-04-21 NOTE — H&P (Signed)
Nathaniel Lame, MD [address] [place], [place] [address] Phone:[phone] Fax : [phone]  Primary Care Physician:  Patient, No Pcp Per Primary Gastroenterologist:  Dr. Allen Norris  Pre-Procedure History & Physical: HPI:  Nathaniel Saunders is a 46 y.o. male is here for an ERCP.   Past Medical History:  Diagnosis Date  . Asthma   . Pancreatic mass   . Pancreatic mass     Past Surgical History:  Procedure Laterality Date  . PORTA CATH INSERTION N/A 04/18/2017   Procedure: PORTA CATH INSERTION;  Surgeon: Algernon Huxley, MD;  Location: [place] [facility];  Service: Cardiovascular;  Laterality: N/A;    Prior to Admission medications   Medication Sig Start Date End Date Taking? Authorizing Provider  albuterol (PROVENTIL HFA;VENTOLIN HFA) 108 (90 Base) MCG/ACT inhaler Inhale 1-2 puffs every 6 (six) hours as needed into the lungs for wheezing or shortness of breath.    [provider]  fentaNYL (DURAGESIC - DOSED MCG/HR) 25 MCG/HR patch Place 1 patch (25 mcg total) every 3 (three) days onto the skin. Patient taking differently: Place 25 mcg every three (3) days as needed onto the skin (for severe pain.).  04/05/17   Cammie Sickle, MD  lidocaine-prilocaine (EMLA) cream Apply 1 application topically as needed. Apply generously over the Mediport 45 minutes prior to chemotherapy. 04/12/17   Cammie Sickle, MD  ondansetron (ZOFRAN) 8 MG tablet One pill every 8 hours as needed for nausea/vomitting. 04/12/17   Cammie Sickle, MD  oxyCODONE-acetaminophen (ROXICET) 5-325 MG tablet 6- 8 hours as needed for pain Patient taking differently: Take 1 tablet every 6 (six) hours as needed by mouth (for pain.).  03/24/17   Cammie Sickle, MD  prochlorperazine (COMPAZINE) 10 MG tablet Take 1 tablet (10 mg total) by mouth every 6 (six) hours as needed for nausea or vomiting. 04/12/17   Cammie Sickle, MD    Allergies as of 04/21/2017  . (No Known  Allergies)    Family History  Problem Relation Age of Onset  . Pancreatic cancer Mother 91  . Pancreatic cancer Maternal Grandmother 75    Social History   Socioeconomic History  . Marital status: Single    Spouse name: Not on file  . Number of children: Not on file  . Years of education: Not on file  . Highest education level: Not on file  Social Needs  . Financial resource strain: Not on file  . Food insecurity - worry: Not on file  . Food insecurity - inability: Not on file  . Transportation needs - medical: Not on file  . Transportation needs - non-medical: Not on file  Occupational History  . Not on file  Tobacco Use  . Smoking status: Former Smoker    Packs/day: 0.25    Years: 5.00    Pack years: 1.25    Types: Cigarettes  . Smokeless tobacco: Never Used  Substance and Sexual Activity  . Alcohol use: No  . Drug use: No  . Sexual activity: Not on file  Other Topics Concern  . Not on file  Social History Narrative  . Not on file    Review of Systems: See HPI, otherwise negative ROS  Physical Exam: BP 104/66   Pulse (!) 56   Temp 97.7 F (36.5 C) (Oral)   Resp 19   Ht 5\' 3"  (1.6 m)   Wt 130 lb (59 kg)   SpO2 97%   BMI 23.03  kg/m  General:   Alert,  pleasant and cooperative in NAD Head:  Normocephalic and atraumatic. Neck:  Supple; no masses or thyromegaly. Lungs:  Clear throughout to auscultation.    Heart:  Regular rate and rhythm. Abdomen:  Soft, nontender and nondistended. Normal bowel sounds, without guarding, and without rebound.   Neurologic:  Alert and  oriented x4;  grossly normal neurologically.  Impression/Plan: Nathaniel Saunders is here for an ERCP to be performed for pancreatic mass  Risks, benefits, limitations, and alternatives regarding  ERCP have been reviewed with the patient.  Questions have been answered.  All parties agreeable.   Nathaniel Lame, MD  04/21/2017, 1:56 PM

## 2017-04-21 NOTE — ED Triage Notes (Signed)
Pt states he has pancreatic CA and is suppose to start chemotherapy tomorrow. States in the past 2 days is having increased abd pain with N/V and is unable to eat or drink. Pt has noted jaundice sclera which is new for the pt.

## 2017-04-21 NOTE — Anesthesia Postprocedure Evaluation (Signed)
Anesthesia Post Note  Patient: Nathaniel Saunders  Procedure(s) Performed: ENDOSCOPIC RETROGRADE CHOLANGIOPANCREATOGRAPHY (ERCP) (N/A )  Patient location during evaluation: PACU Anesthesia Type: General Level of consciousness: awake and alert Pain management: pain level controlled Vital Signs Assessment: post-procedure vital signs reviewed and stable Respiratory status: spontaneous breathing and respiratory function stable Cardiovascular status: stable Anesthetic complications: no     Last Vitals:  Vitals:   04/21/17 1537 04/21/17 1549  BP: 122/82 124/88  Pulse:  69  Resp:  18  Temp: 36.7 C (!) 36.4 C  SpO2:  100%    Last Pain:  Vitals:   04/21/17 1602  TempSrc:   PainSc: 5                  [name] K

## 2017-04-21 NOTE — OR Nursing (Signed)
Patient is being intubated by anesthesia staff, Vonita Moss and Dr. Ronelle Nigh.

## 2017-04-21 NOTE — H&P (View-Only) (Signed)
Lucilla Lame, MD [address] [place], [place] [address] Phone:[phone] Fax : [phone]  Primary Care Physician:  Patient, No Pcp Per Primary Gastroenterologist:  Dr. Allen Norris  Pre-Procedure History & Physical: HPI:  Nathaniel Saunders is a 46 y.o. male is here for an ERCP.   Past Medical History:  Diagnosis Date  . Asthma   . Pancreatic mass   . Pancreatic mass     Past Surgical History:  Procedure Laterality Date  . PORTA CATH INSERTION N/A 04/18/2017   Procedure: PORTA CATH INSERTION;  Surgeon: Algernon Huxley, MD;  Location: [place] [facility];  Service: Cardiovascular;  Laterality: N/A;    Prior to Admission medications   Medication Sig Start Date End Date Taking? Authorizing Provider  albuterol (PROVENTIL HFA;VENTOLIN HFA) 108 (90 Base) MCG/ACT inhaler Inhale 1-2 puffs every 6 (six) hours as needed into the lungs for wheezing or shortness of breath.    [provider]  fentaNYL (DURAGESIC - DOSED MCG/HR) 25 MCG/HR patch Place 1 patch (25 mcg total) every 3 (three) days onto the skin. Patient taking differently: Place 25 mcg every three (3) days as needed onto the skin (for severe pain.).  04/05/17   Cammie Sickle, MD  lidocaine-prilocaine (EMLA) cream Apply 1 application topically as needed. Apply generously over the Mediport 45 minutes prior to chemotherapy. 04/12/17   Cammie Sickle, MD  ondansetron (ZOFRAN) 8 MG tablet One pill every 8 hours as needed for nausea/vomitting. 04/12/17   Cammie Sickle, MD  oxyCODONE-acetaminophen (ROXICET) 5-325 MG tablet 6- 8 hours as needed for pain Patient taking differently: Take 1 tablet every 6 (six) hours as needed by mouth (for pain.).  03/24/17   Cammie Sickle, MD  prochlorperazine (COMPAZINE) 10 MG tablet Take 1 tablet (10 mg total) by mouth every 6 (six) hours as needed for nausea or vomiting. 04/12/17   Cammie Sickle, MD    Allergies as of 04/21/2017  . (No Known  Allergies)    Family History  Problem Relation Age of Onset  . Pancreatic cancer Mother 30  . Pancreatic cancer Maternal Grandmother 41    Social History   Socioeconomic History  . Marital status: Single    Spouse name: Not on file  . Number of children: Not on file  . Years of education: Not on file  . Highest education level: Not on file  Social Needs  . Financial resource strain: Not on file  . Food insecurity - worry: Not on file  . Food insecurity - inability: Not on file  . Transportation needs - medical: Not on file  . Transportation needs - non-medical: Not on file  Occupational History  . Not on file  Tobacco Use  . Smoking status: Former Smoker    Packs/day: 0.25    Years: 5.00    Pack years: 1.25    Types: Cigarettes  . Smokeless tobacco: Never Used  Substance and Sexual Activity  . Alcohol use: No  . Drug use: No  . Sexual activity: Not on file  Other Topics Concern  . Not on file  Social History Narrative  . Not on file    Review of Systems: See HPI, otherwise negative ROS  Physical Exam: BP 104/66   Pulse (!) 56   Temp 97.7 F (36.5 C) (Oral)   Resp 19   Ht 5\' 3"  (1.6 m)   Wt 130 lb (59 kg)   SpO2 97%   BMI 23.03  kg/m  General:   Alert,  pleasant and cooperative in NAD Head:  Normocephalic and atraumatic. Neck:  Supple; no masses or thyromegaly. Lungs:  Clear throughout to auscultation.    Heart:  Regular rate and rhythm. Abdomen:  Soft, nontender and nondistended. Normal bowel sounds, without guarding, and without rebound.   Neurologic:  Alert and  oriented x4;  grossly normal neurologically.  Impression/Plan: Nathaniel Saunders is here for an ERCP to be performed for pancreatic mass  Risks, benefits, limitations, and alternatives regarding  ERCP have been reviewed with the patient.  Questions have been answered.  All parties agreeable.   Lucilla Lame, MD  04/21/2017, 1:56 PM

## 2017-04-21 NOTE — ED Notes (Signed)
ED Provider at bedside. 

## 2017-04-21 NOTE — Anesthesia Preprocedure Evaluation (Addendum)
Anesthesia Evaluation  Patient identified by MRN, date of birth, ID band Patient awake    Reviewed: Allergy & Precautions, H&P , NPO status , Patient's Chart, lab work & pertinent test results  History of Anesthesia Complications Negative for: history of anesthetic complications  Airway Mallampati: III  TM Distance: >3 FB Neck ROM: full    Dental  (+) Chipped, Poor Dentition, Loose, Missing   Pulmonary neg shortness of breath, asthma , former smoker,           Cardiovascular Exercise Tolerance: Good (-) angina(-) Past MI and (-) DOE negative cardio ROS       Neuro/Psych negative neurological ROS  negative psych ROS   GI/Hepatic negative GI ROS, Neg liver ROS,   Endo/Other  negative endocrine ROS  Renal/GU negative Renal ROS  negative genitourinary   Musculoskeletal   Abdominal   Peds  Hematology negative hematology ROS (+)   Anesthesia Other Findings Past Medical History: No date: Asthma No date: Pancreatic mass No date: Pancreatic mass  Past Surgical History: 04/18/2017: PORTA CATH INSERTION; N/A     Comment:  Procedure: PORTA CATH INSERTION;  Surgeon: Algernon Huxley,              MD;  Location: [facility];  Service:               Cardiovascular;  Laterality: N/A;  BMI    Body Mass Index:  23.03 kg/m      Reproductive/Obstetrics negative OB ROS                             Anesthesia Physical Anesthesia Plan  ASA: III  Anesthesia Plan: General and General ETT   Post-op Pain Management:    Induction: Intravenous  PONV Risk Score and Plan: Propofol infusion  Airway Management Planned: Oral ETT  Additional Equipment:   Intra-op Plan:   Post-operative Plan:   Informed Consent: I have reviewed the patients History and Physical, chart, labs and discussed the procedure including the risks, benefits and alternatives for the proposed anesthesia with the patient  or authorized representative who has indicated his/her understanding and acceptance.   Dental Advisory Given  Plan Discussed with: Anesthesiologist, CRNA and Surgeon  Anesthesia Plan Comments: (Patient consented for risks of anesthesia including but not limited to:  - adverse reactions to medications - risk of intubation if required - damage to teeth, lips or other oral mucosa - sore throat or hoarseness - Damage to heart, brain, lungs or loss of life  Patient voiced understanding.)       Anesthesia Quick Evaluation

## 2017-04-22 ENCOUNTER — Inpatient Hospital Stay: Payer: No Typology Code available for payment source

## 2017-04-22 ENCOUNTER — Telehealth: Payer: Self-pay | Admitting: Internal Medicine

## 2017-04-22 LAB — HEPATIC FUNCTION PANEL
ALBUMIN: 2.9 g/dL — AB (ref 3.5–5.0)
ALT: 512 U/L — AB (ref 17–63)
AST: 198 U/L — AB (ref 15–41)
Alkaline Phosphatase: 417 U/L — ABNORMAL HIGH (ref 38–126)
BILIRUBIN DIRECT: 3 mg/dL — AB (ref 0.1–0.5)
Indirect Bilirubin: 3.5 mg/dL — ABNORMAL HIGH (ref 0.3–0.9)
Total Bilirubin: 6.5 mg/dL — ABNORMAL HIGH (ref 0.3–1.2)
Total Protein: 6.5 g/dL (ref 6.5–8.1)

## 2017-04-22 LAB — BASIC METABOLIC PANEL
Anion gap: 6 (ref 5–15)
BUN: 8 mg/dL (ref 6–20)
CALCIUM: 8.7 mg/dL — AB (ref 8.9–10.3)
CO2: 22 mmol/L (ref 22–32)
CREATININE: 0.64 mg/dL (ref 0.61–1.24)
Chloride: 109 mmol/L (ref 101–111)
GFR calc Af Amer: >60 mL/min (ref >60)
Glucose, Bld: 94 mg/dL (ref 65–99)
Potassium: 3.8 mmol/L (ref 3.5–5.1)
SODIUM: 137 mmol/L (ref 135–145)

## 2017-04-22 LAB — GLUCOSE, CAPILLARY: Glucose-Capillary: 89 mg/dL (ref 65–99)

## 2017-04-22 LAB — CBC
HCT: 35.6 % — ABNORMAL LOW (ref 40.0–52.0)
Hemoglobin: 12.3 g/dL — ABNORMAL LOW (ref 13.0–18.0)
MCH: 30.8 pg (ref 26.0–34.0)
MCHC: 34.5 g/dL (ref 32.0–36.0)
MCV: 89.1 fL (ref 80.0–100.0)
PLATELETS: 280 10*3/uL (ref 150–440)
RBC: 4 MIL/uL — ABNORMAL LOW (ref 4.40–5.90)
RDW: 15.2 % — AB (ref 11.5–14.5)
WBC: 6.4 10*3/uL (ref 3.8–10.6)

## 2017-04-22 LAB — LIPASE, BLOOD: Lipase: 45 U/L (ref 11–51)

## 2017-04-22 NOTE — Discharge Summary (Signed)
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 Asthma   . Pancreatic mass   . Pancreatic mass     HOSPITAL COURSE:  46 year old male with history of pancreatic cancer who presented to the emergency room due to epigastric abdominal pain and was found to have elevated liver function tests.  1. Abdominal pain with jaundice: Patient underwent ERCP which showed biliary stricture. The entire main bile duct was dilated. A biliary sphincterectomy was performed. One plastic stent was placed into the common bile duct. Patient has no abdominal pain currently. He is tolerating his diet. He will follow-up with GI in 2 weeks.  2. Pancreatic cancer: Patient will follow-up with Dr. Jacinto Reap. Continue pain medications including fentanyl patch.  3. Elevated LFTs due to obstructive jaundice which has improved with stent placement in common bile that.    DISCHARGE CONDITIONS AND DIET:   Stable for discharge and regular diet  CONSULTS OBTAINED:  Treatment Team:  Lucilla Lame, MD Cammie Sickle, MD  DRUG ALLERGIES:  No Known Allergies  DISCHARGE MEDICATIONS:   Allergies as of 04/22/2017   No Known Allergies     Medication List    STOP taking these medications   oxyCODONE-acetaminophen 5-325 MG tablet Commonly known as:  ROXICET   prochlorperazine 10 MG  tablet Commonly known as:  COMPAZINE     TAKE these medications   albuterol 108 (90 Base) MCG/ACT inhaler Commonly known as:  PROVENTIL HFA;VENTOLIN HFA Inhale 1-2 puffs every 6 (six) hours as needed into the lungs for wheezing or shortness of breath.   fentaNYL 25 MCG/HR patch Commonly known as:  DURAGESIC - dosed mcg/hr Place 1 patch (25 mcg total) every 3 (three) days onto the skin. What changed:    when to take this  reasons to take this   lidocaine-prilocaine cream Commonly known as:  EMLA Apply 1 application topically as needed. Apply generously over the Mediport 45 minutes prior to chemotherapy.   ondansetron 8 MG tablet Commonly known as:  ZOFRAN One pill every 8 hours as needed for nausea/vomitting.         Today   oing well this morning. Tolerated diet well. No abdominal pain or nausea.   VITAL SIGNS:  Blood pressure 110/67, pulse (!) 55, temperature (!) 97.4 F (36.3 C), temperature source Oral, resp. rate 16, height 5\' 3"  (1.6 m), weight 61.8 kg (136 lb 3.2 oz), SpO2 100 %.   REVIEW OF SYSTEMS:  Review of Systems  Constitutional: Negative.  Negative for chills, fever and malaise/fatigue.  HENT: Negative.  Negative for ear discharge, ear pain, hearing loss, nosebleeds and sore throat.   Eyes: Negative.  Negative for blurred vision and pain.  Respiratory: Negative.  Negative for cough, hemoptysis, shortness of breath and wheezing.   Cardiovascular: Negative.  Negative for chest pain, palpitations and leg swelling.  Gastrointestinal: Negative.  Negative for abdominal pain, blood in stool,  diarrhea, nausea and vomiting.  Genitourinary: Negative.  Negative for dysuria.  Musculoskeletal: Negative.  Negative for back pain.  Skin: Negative.   Neurological: Negative for dizziness, tremors, speech change, focal weakness, seizures and headaches.  Endo/Heme/Allergies: Negative.  Does not bruise/bleed easily.  Psychiatric/Behavioral: Negative.  Negative for  depression, hallucinations and suicidal ideas.     PHYSICAL EXAMINATION:  GENERAL:  46 y.o.-year-old patient lying in the bed with no acute distress.  Jaundice has improved no obvious icteric sclera NECK:  Supple, no jugular venous distention. No thyroid enlargement, no tenderness.  LUNGS: Normal breath sounds bilaterally, no wheezing, rales,rhonchi  No use of accessory muscles of respiration.  CARDIOVASCULAR: S1, S2 normal. No murmurs, rubs, or gallops.  ABDOMEN: Soft, non-tender, non-distended. Bowel sounds present. No organomegaly or mass.  EXTREMITIES: No pedal edema, cyanosis, or clubbing.  PSYCHIATRIC: The patient is alert and oriented x 3.  SKIN: No obvious rash, lesion, or ulcer.   DATA REVIEW:   CBC Recent Labs  Lab 04/22/17 0443  WBC 6.4  HGB 12.3*  HCT 35.6*  PLT 280    Chemistries  Recent Labs  Lab 04/22/17 0443  NA 137  K 3.8  CL 109  CO2 22  GLUCOSE 94  BUN 8  CREATININE 0.64  CALCIUM 8.7*  AST 198*  ALT 512*  ALKPHOS 417*  BILITOT 6.5*    Cardiac Enzymes No results for input(s): TROPONINI in the last 168 hours.  Microbiology Results  @MICRORSLT48 @  RADIOLOGY:  Dg C-arm 1-60 Min-no Report  Result Date: 04/21/2017 Fluoroscopy was utilized by the requesting physician.  No radiographic interpretation.      Allergies as of 04/22/2017   No Known Allergies     Medication List    STOP taking these medications   oxyCODONE-acetaminophen 5-325 MG tablet Commonly known as:  ROXICET   prochlorperazine 10 MG tablet Commonly known as:  COMPAZINE     TAKE these medications   albuterol 108 (90 Base) MCG/ACT inhaler Commonly known as:  PROVENTIL HFA;VENTOLIN HFA Inhale 1-2 puffs every 6 (six) hours as needed into the lungs for wheezing or shortness of breath.   fentaNYL 25 MCG/HR patch Commonly known as:  DURAGESIC - dosed mcg/hr Place 1 patch (25 mcg total) every 3 (three) days onto the skin. What changed:    when to take  this  reasons to take this   lidocaine-prilocaine cream Commonly known as:  EMLA Apply 1 application topically as needed. Apply generously over the Mediport 45 minutes prior to chemotherapy.   ondansetron 8 MG tablet Commonly known as:  ZOFRAN One pill every 8 hours as needed for nausea/vomitting.           Management plans discussed with the patient and he is in agreement. Stable for discharge home  Patient should follow up with oncology  CODE STATUS:     Code Status Orders  (From admission, onward)        Start     Ordered   04/21/17 1320  Full code  Continuous     04/21/17 1322    Code Status History    Date Active Date Inactive Code Status Order ID Comments User Context   This patient has a current code status but no historical code status.      TOTAL TIME TAKING CARE OF THIS PATIENT: 38 minutes.    Note: This dictation was prepared with Dragon dictation along with smaller phrase technology. Any transcriptional errors that result from this process are unintentional.  Delayne Sanzo M.D on 04/22/2017 at 11:06 AM  Between 7am to 6pm - Pager - [phone] After 6pm go to [email or web address] - password [email or web address]  Office  [phone]  CC: Primary care physician; dr Rogue Bussing

## 2017-04-22 NOTE — Telephone Encounter (Signed)
Robin - Please schedule pt for md at 35 am.on this day per md

## 2017-04-22 NOTE — Telephone Encounter (Signed)
Nathaniel Saunders, this is Correct. Please also schedule his 48 hr d/c pump

## 2017-04-22 NOTE — Telephone Encounter (Signed)
#   Patient discharged from the hospital today; will needs to start chemo- FOLFIRINOX next Tuesday in [place]. Schedule for 8:15am- cbc/cmp/ca-19-9.   # I will see the new pt at; 8:30- awaiting name from referral doc.

## 2017-04-22 NOTE — Progress Notes (Signed)
Discharge instructions given and went over with patient at bedside. All questions answered. Follow-up appointments reviewed. Patient discharged home with family via wheelchair by volunteer services. Madlyn Frankel, RN

## 2017-04-23 LAB — HIV ANTIBODY (ROUTINE TESTING W REFLEX): HIV Screen 4th Generation wRfx: NONREACTIVE

## 2017-04-25 ENCOUNTER — Inpatient Hospital Stay: Payer: No Typology Code available for payment source

## 2017-04-26 ENCOUNTER — Inpatient Hospital Stay: Payer: Medicaid Other

## 2017-04-26 ENCOUNTER — Inpatient Hospital Stay: Payer: Medicaid Other | Attending: Internal Medicine

## 2017-04-26 ENCOUNTER — Encounter: Payer: Self-pay | Admitting: Internal Medicine

## 2017-04-26 ENCOUNTER — Inpatient Hospital Stay (HOSPITAL_BASED_OUTPATIENT_CLINIC_OR_DEPARTMENT_OTHER): Payer: Medicaid Other | Admitting: Internal Medicine

## 2017-04-26 VITALS — BP 150/90 | HR 70 | Temp 96.9°F | Resp 16

## 2017-04-26 DIAGNOSIS — Z79899 Other long term (current) drug therapy: Secondary | ICD-10-CM

## 2017-04-26 DIAGNOSIS — Z7689 Persons encountering health services in other specified circumstances: Secondary | ICD-10-CM

## 2017-04-26 DIAGNOSIS — Z800 Family history of malignant neoplasm of digestive organs: Secondary | ICD-10-CM | POA: Insufficient documentation

## 2017-04-26 DIAGNOSIS — R630 Anorexia: Secondary | ICD-10-CM | POA: Diagnosis not present

## 2017-04-26 DIAGNOSIS — J45909 Unspecified asthma, uncomplicated: Secondary | ICD-10-CM | POA: Diagnosis not present

## 2017-04-26 DIAGNOSIS — R112 Nausea with vomiting, unspecified: Secondary | ICD-10-CM

## 2017-04-26 DIAGNOSIS — R197 Diarrhea, unspecified: Secondary | ICD-10-CM | POA: Insufficient documentation

## 2017-04-26 DIAGNOSIS — R948 Abnormal results of function studies of other organs and systems: Secondary | ICD-10-CM | POA: Diagnosis not present

## 2017-04-26 DIAGNOSIS — C251 Malignant neoplasm of body of pancreas: Secondary | ICD-10-CM

## 2017-04-26 DIAGNOSIS — J439 Emphysema, unspecified: Secondary | ICD-10-CM | POA: Diagnosis not present

## 2017-04-26 DIAGNOSIS — D72829 Elevated white blood cell count, unspecified: Secondary | ICD-10-CM | POA: Insufficient documentation

## 2017-04-26 DIAGNOSIS — I700 Atherosclerosis of aorta: Secondary | ICD-10-CM | POA: Insufficient documentation

## 2017-04-26 DIAGNOSIS — Z87891 Personal history of nicotine dependence: Secondary | ICD-10-CM | POA: Diagnosis not present

## 2017-04-26 DIAGNOSIS — E860 Dehydration: Secondary | ICD-10-CM | POA: Diagnosis not present

## 2017-04-26 DIAGNOSIS — Z5111 Encounter for antineoplastic chemotherapy: Secondary | ICD-10-CM | POA: Insufficient documentation

## 2017-04-26 DIAGNOSIS — K831 Obstruction of bile duct: Secondary | ICD-10-CM | POA: Insufficient documentation

## 2017-04-26 DIAGNOSIS — G893 Neoplasm related pain (acute) (chronic): Secondary | ICD-10-CM | POA: Insufficient documentation

## 2017-04-26 DIAGNOSIS — J8410 Pulmonary fibrosis, unspecified: Secondary | ICD-10-CM | POA: Diagnosis not present

## 2017-04-26 LAB — HEPATIC FUNCTION PANEL
ALT: 237 U/L — ABNORMAL HIGH (ref 17–63)
AST: 64 U/L — ABNORMAL HIGH (ref 15–41)
Albumin: 3.6 g/dL (ref 3.5–5.0)
Alkaline Phosphatase: 315 U/L — ABNORMAL HIGH (ref 38–126)
BILIRUBIN DIRECT: 1.8 mg/dL — AB (ref 0.1–0.5)
BILIRUBIN INDIRECT: 1.9 mg/dL — AB (ref 0.3–0.9)
BILIRUBIN TOTAL: 3.7 mg/dL — AB (ref 0.3–1.2)
Total Protein: 7.7 g/dL (ref 6.5–8.1)

## 2017-04-26 LAB — CBC WITH DIFFERENTIAL/PLATELET
Basophils Absolute: 0.1 10*3/uL (ref 0–0.1)
Basophils Relative: 1 %
EOS PCT: 3 %
Eosinophils Absolute: 0.2 10*3/uL (ref 0–0.7)
HEMATOCRIT: 37.8 % — AB (ref 40.0–52.0)
Hemoglobin: 12.9 g/dL — ABNORMAL LOW (ref 13.0–18.0)
LYMPHS PCT: 40 %
Lymphs Abs: 2.9 10*3/uL (ref 1.0–3.6)
MCH: 30.5 pg (ref 26.0–34.0)
MCHC: 34.2 g/dL (ref 32.0–36.0)
MCV: 89.3 fL (ref 80.0–100.0)
MONO ABS: 0.6 10*3/uL (ref 0.2–1.0)
MONOS PCT: 9 %
NEUTROS ABS: 3.5 10*3/uL (ref 1.4–6.5)
Neutrophils Relative %: 47 %
PLATELETS: 349 10*3/uL (ref 150–440)
RBC: 4.24 MIL/uL — ABNORMAL LOW (ref 4.40–5.90)
RDW: 14.8 % — AB (ref 11.5–14.5)
WBC: 7.4 10*3/uL (ref 3.8–10.6)

## 2017-04-26 LAB — BASIC METABOLIC PANEL
ANION GAP: 6 (ref 5–15)
BUN: 10 mg/dL (ref 6–20)
CALCIUM: 8.9 mg/dL (ref 8.9–10.3)
CO2: 27 mmol/L (ref 22–32)
Chloride: 103 mmol/L (ref 101–111)
Creatinine, Ser: 0.74 mg/dL (ref 0.61–1.24)
GFR calc Af Amer: >60 mL/min (ref >60)
GLUCOSE: 158 mg/dL — AB (ref 65–99)
Potassium: 3.3 mmol/L — ABNORMAL LOW (ref 3.5–5.1)
Sodium: 136 mmol/L (ref 135–145)

## 2017-04-26 MED ORDER — SODIUM CHLORIDE 0.9% FLUSH
10.0000 mL | INTRAVENOUS | Status: DC | PRN
  Administered 2017-04-26: 10 mL
  Filled 2017-04-26: qty 10

## 2017-04-26 MED ORDER — DEXTROSE 5 % IV SOLN
Freq: Once | INTRAVENOUS | Status: AC
  Administered 2017-04-26: 10:00:00 via INTRAVENOUS
  Filled 2017-04-26: qty 1000

## 2017-04-26 MED ORDER — SODIUM CHLORIDE 0.9 % IV SOLN: 10.0000 mg | Freq: Once | INTRAVENOUS | Status: DC

## 2017-04-26 MED ORDER — ONDANSETRON HCL 40 MG/20ML IJ SOLN: Freq: Once | INTRAMUSCULAR | Status: DC

## 2017-04-26 MED ORDER — PALONOSETRON HCL INJECTION 0.25 MG/5ML
0.2500 mg | Freq: Once | INTRAVENOUS | Status: AC
  Administered 2017-04-26: 0.25 mg via INTRAVENOUS

## 2017-04-26 MED ORDER — FLUOROURACIL CHEMO INJECTION 5 GM/100ML
2400.0000 mg/m2 | INTRAVENOUS | Status: DC
  Administered 2017-04-26: 4050 mg via INTRAVENOUS
  Filled 2017-04-26: qty 81

## 2017-04-26 MED ORDER — HEPARIN SOD (PORK) LOCK FLUSH 100 UNIT/ML IV SOLN
500.0000 [IU] | Freq: Once | INTRAVENOUS | Status: DC | PRN
  Filled 2017-04-26: qty 5

## 2017-04-26 MED ORDER — OXALIPLATIN CHEMO INJECTION 100 MG/20ML
85.0000 mg/m2 | Freq: Once | INTRAVENOUS | Status: AC
  Administered 2017-04-26: 145 mg via INTRAVENOUS
  Filled 2017-04-26: qty 29

## 2017-04-26 MED ORDER — LEUCOVORIN CALCIUM INJECTION 350 MG: 400.0000 mg/m2 | Freq: Once | INTRAVENOUS | Status: DC

## 2017-04-26 MED ORDER — ONDANSETRON HCL 4 MG/2ML IJ SOLN
8.0000 mg | Freq: Once | INTRAMUSCULAR | Status: AC
  Administered 2017-04-26: 8 mg via INTRAVENOUS
  Filled 2017-04-26: qty 4

## 2017-04-26 MED ORDER — FLUOROURACIL CHEMO INJECTION 2.5 GM/50ML
400.0000 mg/m2 | Freq: Once | INTRAVENOUS | Status: AC
  Administered 2017-04-26: 650 mg via INTRAVENOUS
  Filled 2017-04-26: qty 13

## 2017-04-26 MED ORDER — LEUCOVORIN CALCIUM INJECTION 350 MG
700.0000 mg | Freq: Once | INTRAMUSCULAR | Status: AC
  Administered 2017-04-26: 700 mg via INTRAVENOUS
  Filled 2017-04-26: qty 35

## 2017-04-26 MED ORDER — IRINOTECAN HCL CHEMO INJECTION 100 MG/5ML
120.0000 mg/m2 | Freq: Once | INTRAVENOUS | Status: AC
  Administered 2017-04-26: 200 mg via INTRAVENOUS
  Filled 2017-04-26: qty 10

## 2017-04-26 MED ORDER — ATROPINE SULFATE 1 MG/ML IJ SOLN
0.5000 mg | Freq: Once | INTRAMUSCULAR | Status: AC | PRN
  Administered 2017-04-26: 0.5 mg via INTRAVENOUS
  Filled 2017-04-26: qty 1

## 2017-04-26 MED ORDER — DEXAMETHASONE SODIUM PHOSPHATE 10 MG/ML IJ SOLN
10.0000 mg | Freq: Once | INTRAMUSCULAR | Status: AC
  Administered 2017-04-26: 10 mg via INTRAVENOUS

## 2017-04-26 NOTE — Assessment & Plan Note (Addendum)
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  No evidence of any local adenopathy/or liver metastasis.  CT chest- NO metastases; ? Fibrosis.   #Proceed with neoadjuvant chemotherapy FOLFIRINOX; however given the recent biliary obstruction [see discussion below]; bilirubin 3-recommend decreasing the dose of irinotecan to 150 mg/per meter square.   #Long discussion with patient and family regarding the potential side effects of chemotherapy; including but not limited to nausea vomiting diarrhea; fatigue; risk of infections and also neuropathy.  # Biliary obstruction-status post ERCP stenting-significant improvement after stenting.  # Growth factor-Neulasta/On pro would be given as prophylaxis for chemotherapy-induced neutropenia to prevent febrile neutropenias.    # Genetic testing-  Interested in genetic testing;  Will order at next visit.   # Pain control-better controlled; continue fentanyl patch 25 g and also Percocet.  Better controlled.  #Prescription for EMLA cream; antiemetics sent to pharmacy.  Imodium printed.  #Labs 1 week; possible fluids; follow-up in 2 weeks labs; chemo.  Referral to [facility] discussed with Drue Dun.  # 40 minutes face-to-face with the patient discussing the above plan of care; more than 50% of time spent on prognosis/ natural history; counseling and coordination.

## 2017-04-26 NOTE — Patient Instructions (Signed)
Patient instructed on how to call for any problems with his pump. Educated to keep on track with nausea meds and diarrhea meds as needed. Instructed patient to call sooner rather then later regarding any dehydration and need for IVF's.

## 2017-04-26 NOTE — Progress Notes (Signed)
[facility] CONSULT NOTE  Patient Care Team: Maeola Sarah, [name], RN as Registered Nurse  CHIEF COMPLAINTS/PURPOSE OF CONSULTATION: Pancreatic mass  #  Oncology History   # November 2018- PANCREATIC ADENOCARCINOMA pancreatic neck mass [~2.5cm];  [name]; [name]; No mets  # Dec 4th 2018-FOLFIRINOX [neo-adj chemo]   # Nov 2018-biliary obstruction status post ERCP; [name]   # Family history of pancreatic cancer     Malignant tumor of body of pancreas ([place])   03/24/2017 Initial Diagnosis    Malignant tumor of body of pancreas ([facility])        HISTORY OF PRESENTING ILLNESS:  Nathaniel Saunders 46 y.o.  male with newly diagnosed adenocarcinoma the pancreas-stage II is here to proceed with neoadjuvant chemotherapy.  Patient was admitted to the hospital last week-nausea vomiting poor p.o. Intake.  Was noted to have biliary duct obstruction-bilirubin up to 14.  Patient underwent ERCP with stenting.  Patient was discharged when his bilirubin was getting better.  Patient also had his port placed.  Patient states his pain is controlled; is only exacerbated when he eats fatty food.  Continues to be on fentanyl patch and Percocet.  His appetite is fair.  Nausea vomiting is improved.  ROS: A complete 10 point review of system is done which is negative except mentioned above in history of present illness  MEDICAL HISTORY:  Asthma [well controlled] Past Medical History:  Diagnosis Date  . Asthma   . Pancreatic mass   . Pancreatic mass     SURGICAL HISTORY: Past Surgical History:  Procedure Laterality Date  . ERCP N/A 04/21/2017   Procedure: ENDOSCOPIC RETROGRADE CHOLANGIOPANCREATOGRAPHY (ERCP);  Surgeon: Lucilla Lame, MD;  [facility];  Service: Endoscopy;  Laterality: N/A;  . PORTA CATH INSERTION  N/A 04/18/2017   Procedure: PORTA CATH INSERTION;  Surgeon: Algernon Huxley, MD;  Location: [facility];  Service: Cardiovascular;  Laterality: N/A;    SOCIAL HISTORY:  he lives in [place]; sister/girl friend; he has children aged  50/18/ 6 years;  quit smkoing - 5 months; alcohol- none; truck driver. Social History   Socioeconomic History  . Marital status: Single    Spouse name: Not on file  . Number of children: Not on file  . Years of education: Not on file  . Highest education level: Not on file  Social Needs  . Financial resource strain: Not on file  . Food insecurity - worry: Not on file  . Food insecurity - inability: Not on file  . Transportation needs - medical: Not on file  . Transportation needs - non-medical: Not on file  Occupational History  . Not on file  Tobacco Use  . Smoking status: Former Smoker    Packs/day: 0.25    Years: 5.00    Pack years: 1.25    Types: Cigarettes  . Smokeless tobacco: Never Used  Substance and Sexual Activity  . Alcohol use: No  . Drug use: No  . Sexual activity: Not on file  Other Topics Concern  . Not on file  Social History Narrative  . Not on file    FAMILY HISTORY: 2 daughters [23 and 54]; boy- 27 years; one sister- 33 years. diagnosis of pancreatic cancer in mother; grandmother the patient's young age.  Family History  Problem Relation Age of Onset  . Pancreatic  cancer Mother 20  . Pancreatic cancer Maternal Grandmother 60    ALLERGIES:  has No Known Allergies.  MEDICATIONS:  Current Outpatient Medications  Medication Sig Dispense Refill  . albuterol (PROVENTIL HFA;VENTOLIN HFA) 108 (90 Base) MCG/ACT inhaler Inhale 1-2 puffs every 6 (six) hours as needed into the lungs for wheezing or shortness of breath.    . fentaNYL (DURAGESIC - DOSED MCG/HR) 25 MCG/HR patch Place 1 patch (25 mcg total) every 3 (three) days onto the skin. (Patient taking differently: Place 25 mcg every three (3) days as needed onto the skin  (for severe pain.). ) 3 patch 0  . lidocaine-prilocaine (EMLA) cream Apply 1 application topically as needed. Apply generously over the Mediport 45 minutes prior to chemotherapy. 30 g 0  . ondansetron (ZOFRAN) 8 MG tablet One pill every 8 hours as needed for nausea/vomitting. 40 tablet 1   No current facility-administered medications for this visit.    Facility-Administered Medications Ordered in Other Visits  Medication Dose Route Frequency Provider Last Rate Last Dose  . sodium chloride flush (NS) 0.9 % injection 10 mL  10 mL Intracatheter PRN Cammie Sickle, MD   10 mL at 04/28/17 1340      .  PHYSICAL EXAMINATION: ECOG PERFORMANCE STATUS: 1 - Symptomatic but completely ambulatory  Vitals:   04/26/17 0850  BP: 117/78  Pulse: 64  Resp: 16  Temp: 98.7 F (37.1 C)   Filed Weights   04/26/17 0850  Weight: 134 lb 1.7 oz (60.8 kg)    GENERAL: Well-nourished well-developed; Alert, no distress and comfortable.   Alone. EYES: no pallor or icterus OROPHARYNX: no thrush or ulceration; poor dentition  NECK: supple, no masses felt LYMPH:  no palpable lymphadenopathy in the cervical, axillary or inguinal regions LUNGS: clear to auscultation and  No wheeze or crackles HEART/CVS: regular rate & rhythm and no murmurs; No lower extremity edema ABDOMEN: abdomen soft, non-tender and normal bowel sounds Musculoskeletal:no cyanosis of digits and no clubbing  PSYCH: alert & oriented x 3 with fluent speech NEURO: no focal motor/sensory deficits SKIN:  no rashes or significant lesions  LABORATORY DATA:  I have reviewed the data as listed Lab Results  Component Value Date   WBC 7.4 04/26/2017   HGB 12.9 (L) 04/26/2017   HCT 37.8 (L) 04/26/2017   MCV 89.3 04/26/2017   PLT 349 04/26/2017   Recent Labs    03/24/17 1537 04/21/17 1058 04/22/17 0443 04/26/17 0840  NA  --  137 137 136  K  --  3.6 3.8 3.3*  CL  --  101 109 103  CO2  --  23 22 27   GLUCOSE  --  112* 94 158*  BUN   --  9 8 10   CREATININE  --  0.46* 0.64 0.74  CALCIUM  --  9.7 8.7* 8.9  GFRNONAA  --  >60 >60 >60  GFRAA  --  >60 >60 >60  PROT 8.0 8.2* 6.5 7.7  ALBUMIN 4.2 3.7 2.9* 3.6  AST 21 336* 198* 64*  ALT 13* 711* 512* 237*  ALKPHOS 54 523* 417* 315*  BILITOT 0.6 14.8* 6.5* 3.7*  BILIDIR 0.1  --  3.0* 1.8*  IBILI 0.5  --  3.5* 1.9*    RADIOGRAPHIC STUDIES: I have personally reviewed the radiological images as listed and agreed with the findings in the report. Ct Chest W Contrast  Result Date: 04/15/2017 CLINICAL DATA:  Staging evaluation. Newly diagnosed pancreatic cancer. EXAM: CT CHEST WITH CONTRAST TECHNIQUE:  Multidetector CT imaging of the chest was performed during intravenous contrast administration. CONTRAST:  60mL ISOVUE-300 IOPAMIDOL (ISOVUE-300) INJECTION 61% COMPARISON:  CT abdomen pelvis 04/01/2017; CT head of pelvis 03/23/2017. FINDINGS: Cardiovascular: Normal heart size. No pericardial effusion. Coronary artery vascular calcifications. Aorta main pulmonary artery normal in caliber. Mediastinum/Nodes: Small hiatal hernia. No enlarged axillary, mediastinal or hilar lymphadenopathy. Lungs/Pleura: Central airways are patent. Bilateral regions of architectural distortion, bronchiectasis and linear opacities are demonstrated compatible with pulmonary parenchymal scarring. There is a 4 mm left lower lobe nodule (image 86; series 3). No pleural effusion or pneumothorax. Upper Abdomen: Incompletely visualized biliary ductal and pancreatic ductal dilatation. Incompletely visualized pancreatic masses. No acute process. Musculoskeletal: Thoracic spine degenerative changes. No aggressive or acute appearing osseous lesions. IMPRESSION: 1. Bilateral pulmonary parenchymal findings most compatible with scarring and fibrosis. Possibility of interstitial lung disease is not excluded. Consider short-term follow-up high-resolution chest CT. 2. No definite evidence of pulmonary metastatic disease although  evaluation is somewhat limited secondary to extensive parenchymal scarring and fibrosis. 3. Emphysema (ICD10-J43.9). Electronically Signed   By: Lovey Newcomer M.D.   On: 04/15/2017 14:06   Dg C-arm 1-60 Min-no Report  Result Date: 04/21/2017 Fluoroscopy was utilized by the requesting physician.  No radiographic interpretation.   Ct Pancreas Abd W/wo  Result Date: 04/01/2017 CLINICAL DATA:  Pancreas cancer EXAM: CT ABDOMEN WITHOUT AND WITH CONTRAST TECHNIQUE: Multidetector CT imaging of the abdomen was performed following the standard protocol before and following the bolus administration of intravenous contrast. CONTRAST:  111mL ISOVUE-370 IOPAMIDOL (ISOVUE-370) INJECTION 76% COMPARISON:  None. FINDINGS: Lower chest: Scarring and architectural distortion within the right middle lobe and left lower lobe identified. Moderate changes of centrilobular emphysema identified. No pleural effusion. Hepatobiliary: No focal liver abnormality. The gallbladder is normal. The common bile duct is increased in caliber proximally measuring 9 mm. Focal narrowing of the duct at the level of the pancreatic head noted. Pancreas: Hypoenhancing mass within the head of pancreas measures 1.8 by 2.2 by 2.3 cm. Within the neck, body and tail of pancreas there is dilatation of the pancreatic duct. The main portal vein is completely encased and narrowed by the mass, image 35 of series 5 and image 62 of series 6. The superior mesenteric artery is uninvolved. The celiac trunk appears uninvolved. The lesion touches a portion of the common hepatic artery but does not completely encased the vessel, image 64 of series 6. Cystic lesion within the tail of pancreas measures 6 mm. Within the distal tail of pancreas there is an area of relative hypoenhancement which may represent a second mass. This measures 1.7 x 1.4 cm, image 21 of series 4. Spleen: Normal in size without focal abnormality. Adrenals/Urinary Tract: Normal appearance of the adrenal  glands. Right kidney cysts. No mass or hydronephrosis. Stomach/Bowel: Stomach is within normal limits. Appendix appears normal. No evidence of bowel wall thickening, distention, or inflammatory changes. Vascular/Lymphatic: Aortic atherosclerosis. No aneurysm. No adenopathy. Other: No abdominal wall hernia or abnormality. Musculoskeletal: No acute or significant osseous findings. IMPRESSION: 1. Again seen is a hypoenhancing mass within the head of pancreas with associated common bile duct dilatation, pancreatic duct dilatation and encasement and narrowing of the main portal vein. This is highly worrisome for pancreatic adenocarcinoma. No evidence for celiac trunk or superior mesenteric artery encasement. No evidence for liver metastasis or upper abdominal adenopathy. 2. A second indeterminate hypoenhancing lesion is noted within the distal tail of pancreas. A third lesion within the pancreatic tail has a benign  cystic appearance. 3.  Aortic Atherosclerosis (ICD10-I70.0). Electronically Signed   By: Kerby Moors M.D.   On: 04/01/2017 12:06    ASSESSMENT & PLAN:   Malignant tumor of body of pancreas ([place]) # Pancreatic mass- approximately 2.5 cm in size; partially encasing the portal vein. Stage II;  EUS- uT3nN0- abutting SMV/encasing portal vein.  No evidence of any local adenopathy/or liver metastasis.  CT chest- NO metastases; ? Fibrosis.   #Proceed with neoadjuvant chemotherapy FOLFIRINOX; however given the recent biliary obstruction [see discussion below]; bilirubin 3-recommend decreasing the dose of irinotecan to 150 mg/per meter square.   #Long discussion with patient and family regarding the potential side effects of chemotherapy; including but not limited to nausea vomiting diarrhea; fatigue; risk of infections and also neuropathy.  # Biliary obstruction-status post ERCP stenting-significant improvement after stenting.  # Growth factor-Neulasta/On pro would be given as prophylaxis for  chemotherapy-induced neutropenia to prevent febrile neutropenias.    # Genetic testing-  Interested in genetic testing;  Will order at next visit.   # Pain control-better controlled; continue fentanyl patch 25 g and also Percocet.  Better controlled.  #Prescription for EMLA cream; antiemetics sent to pharmacy.  Imodium printed.  #Labs 1 week; possible fluids; follow-up in 2 weeks labs; chemo.  Referral to [facility] discussed with [facility].   # I reviewed the blood work- with the patient in detail; also reviewed the imaging independently [as summarized above]; and with the patient in detail.   All questions were answered. The patient knows to call the clinic with any problems, questions or concerns.    Cammie Sickle, MD 04/28/2017 4:29 PM

## 2017-04-26 NOTE — Progress Notes (Signed)
1530 Patient became nauseated. Gave Zofran 8mg  IVP for the nausea.  1600 patient denies any nausea at time of discharge. States, "medicine helped a whole lot".

## 2017-04-26 NOTE — Progress Notes (Signed)
Dietary referral placed. States he has meeting with PSN this week for financial issues. States he is feeling better after recent hospitalization. Here to start initial chemotherapy treatment. Oncology Nurse Navigator Documentation  Navigator Location: [facility] (04/26/17 0900)   )Navigator Encounter Type: Treatment (04/26/17 0900)                   Treatment Initiated Date: 04/26/17 (04/26/17 0900)   Treatment Phase: First Chemo Tx (04/26/17 0900) Barriers/Navigation Needs: Financial (04/26/17 0900)                          Time Spent with Patient: 30 (04/26/17 0900)

## 2017-04-26 NOTE — Progress Notes (Signed)
Patient is here today for a follow up. Patient c/o abdominal cramping, and a "stabbing pain"  (pain scale 6/10)

## 2017-04-27 ENCOUNTER — Ambulatory Visit: Payer: No Typology Code available for payment source

## 2017-04-27 ENCOUNTER — Other Ambulatory Visit: Payer: No Typology Code available for payment source

## 2017-04-27 LAB — CANCER ANTIGEN 19-9: CA 19-9: 68 U/mL — ABNORMAL HIGH (ref 0–35)

## 2017-04-28 ENCOUNTER — Inpatient Hospital Stay: Payer: Medicaid Other

## 2017-04-28 VITALS — BP 131/86 | HR 65 | Temp 96.2°F | Resp 18

## 2017-04-28 DIAGNOSIS — Z5111 Encounter for antineoplastic chemotherapy: Secondary | ICD-10-CM | POA: Diagnosis not present

## 2017-04-28 DIAGNOSIS — C251 Malignant neoplasm of body of pancreas: Secondary | ICD-10-CM

## 2017-04-28 MED ORDER — PEGFILGRASTIM 6 MG/0.6ML ~~LOC~~ PSKT
6.0000 mg | PREFILLED_SYRINGE | Freq: Once | SUBCUTANEOUS | Status: AC
  Administered 2017-04-28: 6 mg via SUBCUTANEOUS

## 2017-04-28 MED ORDER — SODIUM CHLORIDE 0.9 % IV SOLN
Freq: Once | INTRAVENOUS | Status: AC
  Administered 2017-04-28: 14:00:00 via INTRAVENOUS
  Filled 2017-04-28: qty 1000

## 2017-04-28 MED ORDER — HEPARIN SOD (PORK) LOCK FLUSH 100 UNIT/ML IV SOLN
500.0000 [IU] | Freq: Once | INTRAVENOUS | Status: AC | PRN
  Administered 2017-04-28: 500 [IU]

## 2017-04-28 MED ORDER — SODIUM CHLORIDE 0.9% FLUSH
10.0000 mL | INTRAVENOUS | Status: DC | PRN
  Administered 2017-04-28: 10 mL
  Filled 2017-04-28: qty 10

## 2017-04-28 NOTE — Progress Notes (Signed)
Patient complaining of weakness. States he has not been eating or drinking very much. He is keeping chicken broth and pedialyte down. Patient asking for IV fluids. Will give 1 Liter of NS this afternoon. MD notified.

## 2017-04-29 ENCOUNTER — Telehealth: Payer: Self-pay | Admitting: *Deleted

## 2017-04-29 ENCOUNTER — Inpatient Hospital Stay: Payer: Medicaid Other

## 2017-04-29 ENCOUNTER — Observation Stay: Payer: No Typology Code available for payment source

## 2017-04-29 ENCOUNTER — Observation Stay
Admission: AD | Admit: 2017-04-29 | Discharge: 2017-04-30 | Disposition: A | Discharge status: Home / Self Care | Payer: No Typology Code available for payment source | Source: Ambulatory Visit | Attending: Internal Medicine | Admitting: Internal Medicine

## 2017-04-29 ENCOUNTER — Other Ambulatory Visit: Payer: Self-pay

## 2017-04-29 ENCOUNTER — Encounter: Payer: Self-pay | Admitting: Oncology

## 2017-04-29 ENCOUNTER — Encounter: Payer: Self-pay | Admitting: Radiology

## 2017-04-29 ENCOUNTER — Inpatient Hospital Stay (HOSPITAL_BASED_OUTPATIENT_CLINIC_OR_DEPARTMENT_OTHER): Payer: Medicaid Other | Admitting: Oncology

## 2017-04-29 ENCOUNTER — Telehealth: Payer: Self-pay

## 2017-04-29 ENCOUNTER — Other Ambulatory Visit: Payer: Self-pay | Admitting: *Deleted

## 2017-04-29 VITALS — BP 113/78 | HR 66 | Temp 97.8°F | Resp 18 | Ht 63.0 in | Wt 127.0 lb

## 2017-04-29 DIAGNOSIS — C801 Malignant (primary) neoplasm, unspecified: Secondary | ICD-10-CM | POA: Diagnosis present

## 2017-04-29 DIAGNOSIS — K831 Obstruction of bile duct: Secondary | ICD-10-CM

## 2017-04-29 DIAGNOSIS — J439 Emphysema, unspecified: Secondary | ICD-10-CM | POA: Diagnosis not present

## 2017-04-29 DIAGNOSIS — I700 Atherosclerosis of aorta: Secondary | ICD-10-CM

## 2017-04-29 DIAGNOSIS — Z95828 Presence of other vascular implants and grafts: Secondary | ICD-10-CM

## 2017-04-29 DIAGNOSIS — Z87891 Personal history of nicotine dependence: Secondary | ICD-10-CM

## 2017-04-29 DIAGNOSIS — R197 Diarrhea, unspecified: Secondary | ICD-10-CM | POA: Insufficient documentation

## 2017-04-29 DIAGNOSIS — R1013 Epigastric pain: Secondary | ICD-10-CM | POA: Diagnosis not present

## 2017-04-29 DIAGNOSIS — D72829 Elevated white blood cell count, unspecified: Secondary | ICD-10-CM

## 2017-04-29 DIAGNOSIS — Z79899 Other long term (current) drug therapy: Secondary | ICD-10-CM

## 2017-04-29 DIAGNOSIS — Z800 Family history of malignant neoplasm of digestive organs: Secondary | ICD-10-CM

## 2017-04-29 DIAGNOSIS — E860 Dehydration: Secondary | ICD-10-CM

## 2017-04-29 DIAGNOSIS — C251 Malignant neoplasm of body of pancreas: Secondary | ICD-10-CM | POA: Diagnosis not present

## 2017-04-29 DIAGNOSIS — Z7689 Persons encountering health services in other specified circumstances: Secondary | ICD-10-CM

## 2017-04-29 DIAGNOSIS — R112 Nausea with vomiting, unspecified: Secondary | ICD-10-CM | POA: Diagnosis present

## 2017-04-29 DIAGNOSIS — Z5111 Encounter for antineoplastic chemotherapy: Secondary | ICD-10-CM | POA: Diagnosis not present

## 2017-04-29 DIAGNOSIS — J45909 Unspecified asthma, uncomplicated: Secondary | ICD-10-CM

## 2017-04-29 DIAGNOSIS — R7989 Other specified abnormal findings of blood chemistry: Secondary | ICD-10-CM | POA: Insufficient documentation

## 2017-04-29 DIAGNOSIS — C257 Malignant neoplasm of other parts of pancreas: Secondary | ICD-10-CM | POA: Diagnosis not present

## 2017-04-29 DIAGNOSIS — Z9221 Personal history of antineoplastic chemotherapy: Secondary | ICD-10-CM | POA: Diagnosis not present

## 2017-04-29 DIAGNOSIS — R630 Anorexia: Secondary | ICD-10-CM | POA: Diagnosis not present

## 2017-04-29 DIAGNOSIS — G893 Neoplasm related pain (acute) (chronic): Secondary | ICD-10-CM

## 2017-04-29 DIAGNOSIS — J8410 Pulmonary fibrosis, unspecified: Secondary | ICD-10-CM

## 2017-04-29 DIAGNOSIS — R17 Unspecified jaundice: Secondary | ICD-10-CM

## 2017-04-29 LAB — CBC WITH DIFFERENTIAL/PLATELET
BASOS ABS: 0.1 10*3/uL (ref 0–0.1)
BASOS PCT: 0 %
EOS ABS: 0.1 10*3/uL (ref 0–0.7)
EOS PCT: 1 %
HCT: 39.5 % — ABNORMAL LOW (ref 40.0–52.0)
HEMOGLOBIN: 13.4 g/dL (ref 13.0–18.0)
LYMPHS ABS: 2.5 10*3/uL (ref 1.0–3.6)
Lymphocytes Relative: 14 %
MCH: 30.4 pg (ref 26.0–34.0)
MCHC: 33.9 g/dL (ref 32.0–36.0)
MCV: 89.6 fL (ref 80.0–100.0)
Monocytes Absolute: 0.4 10*3/uL (ref 0.2–1.0)
Monocytes Relative: 2 %
NEUTROS PCT: 83 %
Neutro Abs: 15.3 10*3/uL — ABNORMAL HIGH (ref 1.4–6.5)
PLATELETS: 392 10*3/uL (ref 150–440)
RBC: 4.41 MIL/uL (ref 4.40–5.90)
RDW: 14.4 % (ref 11.5–14.5)
WBC: 18.4 10*3/uL — AB (ref 3.8–10.6)

## 2017-04-29 LAB — COMPREHENSIVE METABOLIC PANEL
ALT: 142 U/L — AB (ref 17–63)
AST: 46 U/L — ABNORMAL HIGH (ref 15–41)
Albumin: 3.6 g/dL (ref 3.5–5.0)
Alkaline Phosphatase: 279 U/L — ABNORMAL HIGH (ref 38–126)
Anion gap: 9 (ref 5–15)
BILIRUBIN TOTAL: 4.3 mg/dL — AB (ref 0.3–1.2)
BUN: 14 mg/dL (ref 6–20)
CHLORIDE: 100 mmol/L — AB (ref 101–111)
CO2: 24 mmol/L (ref 22–32)
CREATININE: 0.77 mg/dL (ref 0.61–1.24)
Calcium: 9.3 mg/dL (ref 8.9–10.3)
Glucose, Bld: 89 mg/dL (ref 65–99)
Potassium: 3.8 mmol/L (ref 3.5–5.1)
Sodium: 133 mmol/L — ABNORMAL LOW (ref 135–145)
TOTAL PROTEIN: 7.8 g/dL (ref 6.5–8.1)

## 2017-04-29 LAB — GLUCOSE, CAPILLARY: Glucose-Capillary: 90 mg/dL (ref 65–99)

## 2017-04-29 LAB — MAGNESIUM: MAGNESIUM: 1.9 mg/dL (ref 1.7–2.4)

## 2017-04-29 MED ORDER — DEXAMETHASONE SODIUM PHOSPHATE 10 MG/ML IJ SOLN
10.0000 mg | Freq: Once | INTRAMUSCULAR | Status: AC
  Administered 2017-04-29: 10 mg via INTRAVENOUS
  Filled 2017-04-29: qty 1

## 2017-04-29 MED ORDER — SODIUM CHLORIDE 0.9 % IV SOLN
INTRAVENOUS | Status: DC
  Administered 2017-04-30: 05:00:00 via INTRAVENOUS

## 2017-04-29 MED ORDER — FENTANYL 25 MCG/HR TD PT72: 25.0000 ug | MEDICATED_PATCH | TRANSDERMAL | 0 refills | Status: DC

## 2017-04-29 MED ORDER — OXYCODONE-ACETAMINOPHEN 5-325 MG PO TABS: 1.0000 | ORAL_TABLET | Freq: Four times a day (QID) | ORAL | 0 refills | Status: DC | PRN

## 2017-04-29 MED ORDER — HEPARIN SOD (PORK) LOCK FLUSH 100 UNIT/ML IV SOLN
500.0000 [IU] | Freq: Once | INTRAVENOUS | Status: AC | PRN
  Administered 2017-04-29: 500 [IU] via INTRAVENOUS

## 2017-04-29 MED ORDER — FENTANYL 25 MCG/HR TD PT72
25.0000 ug | MEDICATED_PATCH | TRANSDERMAL | Status: DC
  Administered 2017-04-29: 25 ug via TRANSDERMAL
  Filled 2017-04-29: qty 1

## 2017-04-29 MED ORDER — IOPAMIDOL (ISOVUE-300) INJECTION 61%
100.0000 mL | Freq: Once | INTRAVENOUS | Status: AC | PRN
  Administered 2017-04-29: 22:00:00 100 mL via INTRAVENOUS

## 2017-04-29 MED ORDER — ONDANSETRON HCL 4 MG/2ML IJ SOLN
8.0000 mg | Freq: Once | INTRAMUSCULAR | Status: AC
  Administered 2017-04-29: 8 mg via INTRAVENOUS
  Filled 2017-04-29: qty 4

## 2017-04-29 MED ORDER — PROCHLORPERAZINE MALEATE 10 MG PO TABS: 10.0000 mg | ORAL_TABLET | Freq: Four times a day (QID) | ORAL | 0 refills | Status: DC | PRN

## 2017-04-29 MED ORDER — PROCHLORPERAZINE MALEATE 10 MG PO TABS
10.0000 mg | ORAL_TABLET | Freq: Four times a day (QID) | ORAL | Status: DC | PRN
  Administered 2017-04-30: 10 mg via ORAL
  Filled 2017-04-29 (×2): qty 1

## 2017-04-29 MED ORDER — LOPERAMIDE HCL 2 MG PO CAPS: 2.0000 mg | ORAL_CAPSULE | ORAL | Status: DC | PRN

## 2017-04-29 MED ORDER — HEPARIN SOD (PORK) LOCK FLUSH 100 UNIT/ML IV SOLN
INTRAVENOUS | Status: AC
  Filled 2017-04-29: qty 5

## 2017-04-29 MED ORDER — LOPERAMIDE HCL 2 MG PO CAPS
2.0000 mg | ORAL_CAPSULE | ORAL | 0 refills | Status: DC | PRN
Start: 2017-04-29 — End: 2017-08-01

## 2017-04-29 MED ORDER — ONDANSETRON HCL 4 MG/2ML IJ SOLN
INTRAMUSCULAR | Status: AC
  Filled 2017-04-29: qty 2

## 2017-04-29 MED ORDER — IOPAMIDOL (ISOVUE-300) INJECTION 61%
15.0000 mL | INTRAVENOUS | Status: AC
  Administered 2017-04-29 (×2): 15 mL via ORAL

## 2017-04-29 MED ORDER — ALBUTEROL SULFATE (2.5 MG/3ML) 0.083% IN NEBU: 3.0000 mL | INHALATION_SOLUTION | Freq: Four times a day (QID) | RESPIRATORY_TRACT | Status: DC | PRN

## 2017-04-29 MED ORDER — DOCUSATE SODIUM 100 MG PO CAPS: 100.0000 mg | ORAL_CAPSULE | Freq: Two times a day (BID) | ORAL | Status: DC | PRN

## 2017-04-29 MED ORDER — SODIUM CHLORIDE 0.9 % IV SOLN
Freq: Once | INTRAVENOUS | Status: AC
  Administered 2017-04-29: 13:00:00 via INTRAVENOUS
  Filled 2017-04-29: qty 1000

## 2017-04-29 MED ORDER — OXYCODONE-ACETAMINOPHEN 5-325 MG PO TABS: 1.0000 | ORAL_TABLET | Freq: Four times a day (QID) | ORAL | Status: DC | PRN

## 2017-04-29 MED ORDER — MORPHINE SULFATE 2 MG/ML IJ SOLN
2.0000 mg | Freq: Once | INTRAMUSCULAR | Status: AC
  Administered 2017-04-29: 2 mg via INTRAVENOUS
  Filled 2017-04-29: qty 1

## 2017-04-29 MED ORDER — SODIUM CHLORIDE 0.9 % IV SOLN: Freq: Once | INTRAVENOUS | Status: DC

## 2017-04-29 MED ORDER — SODIUM CHLORIDE 0.9% FLUSH
10.0000 mL | INTRAVENOUS | Status: DC | PRN
  Administered 2017-04-29: 10 mL via INTRAVENOUS
  Filled 2017-04-29: qty 10

## 2017-04-29 MED ORDER — PROCHLORPERAZINE MALEATE 10 MG PO TABS
10.0000 mg | ORAL_TABLET | Freq: Four times a day (QID) | ORAL | 0 refills | Status: DC | PRN
Start: 2017-04-29 — End: 2017-04-29

## 2017-04-29 NOTE — Progress Notes (Signed)
Call report to Isaias Sakai, RN on 1C. Pt transported by myself via w/c to room 114. Patient is in stable condition-alert/oriented. Pain score now 3/10-abdomen-improved per patient.

## 2017-04-29 NOTE — Telephone Encounter (Signed)
S/o called to report that patient is having uncontrolled pain. I do not see any short acting pain medicine on his list ( Oxycodone was discontinued at hospital discharge). He is on Fentanyl 25 mcg which he uses as needed per chart note. Please advise

## 2017-04-29 NOTE — Telephone Encounter (Signed)
Patient being added to symptom mgmt clinic asap-spoke with Mathis Fare, RN navigator. Pt out of pain medication and is very weak. Has not eaten since Monday. Pt added to symptom mgmt clinic to see Sonia Baller.  lab orders and apts entered-cbc, metc, and mag.

## 2017-04-29 NOTE — Progress Notes (Signed)
1338-Heparin flush performed by RN per NP order-in preparation for home discharge.  Np paged GI provider a decision was made at 1430 to admit pt to [facility].

## 2017-04-29 NOTE — Telephone Encounter (Signed)
Returned call to [employer]. He has not eaten since Monday. Reports a burning in the bottom of his stomach that will not go away. He does not have any pain medication at home.He has used all of his Fentanyl patches.He is drinking very little. Received IVF yesterday. I have spoken with Nira Conn and he will be seen in symptom management clinic. I have instructed his niece to being him to the [facility] now or as soon as she can get him here. Oncology Nurse Navigator Documentation  Navigator Location: [facility] (04/29/17 1000)   )Navigator Encounter Type: Telephone (04/29/17 1000) Telephone: Symptom Mgt (04/29/17 1000)                                                  Time Spent with Patient: 15 (04/29/17 1000)

## 2017-04-29 NOTE — H&P (Signed)
[facility] [place] NAME: Nathaniel Saunders    MR#:  [id number]  DATE OF BIRTH:  [birth year]  DATE OF ADMISSION:  04/29/2017  PRIMARY CARE PHYSICIAN: Maeola Sarah, MD   REQUESTING/REFERRING PHYSICIAN: Brahmandy  CHIEF COMPLAINT:  No chief complaint on file.   HISTORY OF PRESENT ILLNESS: Nathaniel Saunders  is a 46 y.o. male with a known history of Recent diagnosis of pancreatic mass- s/p ERCP and Stent placed last week. Received first chemo and had neupogen after that. oday went for follow up. On  Lab work his LFTs and bilirubin were high. Pt did have one day c/o diarrhea, no pain or fever. Concerned with this, they spoke to Dr. Allen Norris- who agreed to do repeat ERCP tomorrow afternoon on pt and advise to admit the pt.  PAST MEDICAL HISTORY:   Past Medical History:  Diagnosis Date  . Asthma   . Pancreatic mass   . Pancreatic mass     PAST SURGICAL HISTORY:  Past Surgical History:  Procedure Laterality Date  . ERCP N/A 04/21/2017   Procedure: ENDOSCOPIC RETROGRADE CHOLANGIOPANCREATOGRAPHY (ERCP);  Surgeon: Lucilla Lame, MD;  Location: [facility] ENDOSCOPY;  Service: Endoscopy;  Laterality: N/A;  . PORTA CATH INSERTION N/A 04/18/2017   Procedure: PORTA CATH INSERTION;  Surgeon: Algernon Huxley, MD;  Location: [facility];  Service: Cardiovascular;  Laterality: N/A;    SOCIAL HISTORY:  Social History   Tobacco Use  . Smoking status: Former Smoker    Packs/day: 0.25    Years: 5.00    Pack years: 1.25    Types: Cigarettes  . Smokeless tobacco: Never Used  Substance Use Topics  . Alcohol use: No    FAMILY HISTORY:  Family History  Problem Relation Age of Onset  . Pancreatic cancer Mother 12  . Pancreatic cancer Maternal Grandmother 60    DRUG ALLERGIES: No Known Allergies  REVIEW OF SYSTEMS:   CONSTITUTIONAL: No fever, fatigue or weakness.  EYES: No blurred or double vision.  EARS, NOSE, AND THROAT: No tinnitus or ear pain.   RESPIRATORY: No cough, shortness of breath, wheezing or hemoptysis.  CARDIOVASCULAR: No chest pain, orthopnea, edema.  GASTROINTESTINAL: No nausea, vomiting, diarrhea or abdominal pain.  GENITOURINARY: No dysuria, hematuria.  ENDOCRINE: No polyuria, nocturia,  HEMATOLOGY: No anemia, easy bruising or bleeding SKIN: No rash or lesion. MUSCULOSKELETAL: No joint pain or arthritis.   NEUROLOGIC: No tingling, numbness, weakness.  PSYCHIATRY: No anxiety or depression.   MEDICATIONS AT HOME:  Prior to Admission medications   Medication Sig Start Date End Date Taking? Authorizing Provider  albuterol (PROVENTIL HFA;VENTOLIN HFA) 108 (90 Base) MCG/ACT inhaler Inhale 1-2 puffs every 6 (six) hours as needed into the lungs for wheezing or shortness of breath.    [provider]  fentaNYL (DURAGESIC - DOSED MCG/HR) 25 MCG/HR patch Place 1 patch (25 mcg total) onto the skin every 3 (three) days. 04/29/17   Jacquelin Hawking, NP  lidocaine-prilocaine (EMLA) cream Apply 1 application topically as needed. Apply generously over the Mediport 45 minutes prior to chemotherapy. Patient not taking: Reported on 04/29/2017 04/12/17   Cammie Sickle, MD  loperamide (IMODIUM) 2 MG capsule Take 1 capsule (2 mg total) by mouth as needed for diarrhea or loose stools. 04/29/17   Jacquelin Hawking, NP  ondansetron (ZOFRAN) 8 MG tablet One pill every 8 hours as needed for nausea/vomitting. Patient not taking: Reported on 04/29/2017 04/12/17   Cammie Sickle, MD  oxyCODONE-acetaminophen (PERCOCET/ROXICET) 5-325 MG tablet Take 1 tablet by mouth every 6 (six) hours as needed. 04/29/17   Jacquelin Hawking, NP  prochlorperazine (COMPAZINE) 10 MG tablet Take 1 tablet (10 mg total) by mouth every 6 (six) hours as needed for nausea or vomiting. 04/29/17   Jacquelin Hawking, NP      PHYSICAL EXAMINATION:   VITAL SIGNS: Blood pressure 125/75, pulse 65, temperature 97.6 F (36.4 C), temperature source Oral, resp.  rate 18, height 5\' 3"  (1.6 m), weight 57.7 kg (127 lb 4.8 oz), SpO2 98 %.  GENERAL:  46 y.o.-year-old patient lying in the bed with no acute distress.  EYES: Pupils equal, round, reactive to light and accommodation. No scleral icterus. Extraocular muscles intact.  HEENT: Head atraumatic, normocephalic. Oropharynx and nasopharynx clear.  NECK:  Supple, no jugular venous distention. No thyroid enlargement, no tenderness.  LUNGS: Normal breath sounds bilaterally, no wheezing, rales,rhonchi or crepitation. No use of accessory muscles of respiration.  CARDIOVASCULAR: S1, S2 normal. No murmurs, rubs, or gallops.  ABDOMEN: Soft, nontender, nondistended. Bowel sounds present. No organomegaly or mass.  EXTREMITIES: No pedal edema, cyanosis, or clubbing.  NEUROLOGIC: Cranial nerves II through XII are intact. Muscle strength 5/5 in all extremities. Sensation intact. Gait not checked.  PSYCHIATRIC: The patient is alert and oriented x 3.  SKIN: No obvious rash, lesion, or ulcer.   LABORATORY PANEL:   CBC Recent Labs  Lab 04/26/17 0840 04/29/17 1207  WBC 7.4 18.4*  HGB 12.9* 13.4  HCT 37.8* 39.5*  PLT 349 392  MCV 89.3 89.6  MCH 30.5 30.4  MCHC 34.2 33.9  RDW 14.8* 14.4  LYMPHSABS 2.9 2.5  MONOABS 0.6 0.4  EOSABS 0.2 0.1  BASOSABS 0.1 0.1   ------------------------------------------------------------------------------------------------------------------  Chemistries  Recent Labs  Lab 04/26/17 0840 04/29/17 1207  NA 136 133*  K 3.3* 3.8  CL 103 100*  CO2 27 24  GLUCOSE 158* 89  BUN 10 14  CREATININE 0.74 0.77  CALCIUM 8.9 9.3  MG  --  1.9  AST 64* 46*  ALT 237* 142*  ALKPHOS 315* 279*  BILITOT 3.7* 4.3*   ------------------------------------------------------------------------------------------------------------------ estimated creatinine clearance is 93.8 mL/min (by C-G formula based on SCr of 0.77  mg/dL). ------------------------------------------------------------------------------------------------------------------ No results for input(s): TSH, T4TOTAL, T3FREE, THYROIDAB in the last 72 hours.  Invalid input(s): FREET3   Coagulation profile No results for input(s): INR, PROTIME in the last 168 hours. ------------------------------------------------------------------------------------------------------------------- No results for input(s): DDIMER in the last 72 hours. -------------------------------------------------------------------------------------------------------------------  Cardiac Enzymes No results for input(s): CKMB, TROPONINI, MYOGLOBIN in the last 168 hours.  Invalid input(s): CK ------------------------------------------------------------------------------------------------------------------ Invalid input(s): POCBNP  ---------------------------------------------------------------------------------------------------------------  Urinalysis    Component Value Date/Time   COLORURINE AMBER (A) 04/21/2017 1058   APPEARANCEUR CLEAR (A) 04/21/2017 1058   LABSPEC 1.027 04/21/2017 1058   PHURINE 5.0 04/21/2017 1058   GLUCOSEU NEGATIVE 04/21/2017 1058   HGBUR SMALL (A) 04/21/2017 1058   BILIRUBINUR MODERATE (A) 04/21/2017 1058   KETONESUR 5 (A) 04/21/2017 1058   PROTEINUR 30 (A) 04/21/2017 1058   NITRITE NEGATIVE 04/21/2017 1058   LEUKOCYTESUR NEGATIVE 04/21/2017 1058     RADIOLOGY: No results found.  EKG: Orders placed or performed during the hospital encounter of 03/23/17  . ED EKG  . ED EKG    IMPRESSION AND PLAN:  * Elevated LFTs and Bili   Likely obstruction of Bile duct stent   Need repeat ERCP   GI is aware, keep NPO tomorrow    Get  CT abd today to r/o any complications.  * recent pancreatic cancer   On chemo, Oncology consult  * Elevated WBcs   Likely due to chemo and neupogen.   He does not have fever, vomiting, so will not give Abx  now.   Get CT abd  * Asthma   No exacerbation    Monitor.  All the records are reviewed and case discussed with ED provider. Management plans discussed with the patient, family and they are in agreement.  CODE STATUS: Full.    Code Status Orders  (From admission, onward)        Start     Ordered   04/29/17 1730  Full code  Continuous     04/29/17 1729    Code Status History    Date Active Date Inactive Code Status Order ID Comments User Context   04/21/2017 13:22 04/22/2017 15:47 Full Code [id number]  Epifanio Lesches, MD ED       TOTAL TIME TAKING CARE OF THIS PATIENT: 45 minutes.    Vaughan Basta M.D on 04/29/2017   Between 7am to 6pm - Pager - [phone]  After 6pm go to [email or web address] - password [email or web address] [facility]  Office  [phone]  CC: Primary care physician; Maeola Sarah, MD   Note: This dictation was prepared with Dragon dictation along with smaller phrase technology. Any transcriptional errors that result from this process are unintentional.

## 2017-04-29 NOTE — Progress Notes (Signed)
He took his zofran this morning and vomited the oral zofran. He last ate on Monday solids-4 slices of pizza per patient. He did not eat anything else the rest of the week; although tried a few sips of clear chicken broth yesterday. IV fluids and supportive medications administered in exam room - no scanner available.  Pt provided education today on pain mgmt. He was also instructed to contact our office for symptom mgmt appointments rather than waiting until symptoms are at an unmanagable level.  Teach back process performed.

## 2017-04-29 NOTE — Progress Notes (Signed)
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; [name]; No mets  # Dec 4th 2018-FOLFIRINOX [neo-adj chemo]  # Nov 2018-biliary obstruction status post ERCP; [name]  # Family history of pancreatic cancer  Interval history-Patient was last seen by Dr. Rogue Bussing on12/08/2016 where he received his first chemotherapy FOLFIRINOX.  He had recently been discharged from the hospital for biliary obstruction status post ERCP stenting with significant improvement. His pain appeared to be under control with the use of fentanyl patch 25 mcg/hr and Percocet.  Additionally he was given a prescription for EMLA cream antiemetics and Imodium.  He was scheduled to return to clinic in 1 week for fluids and follow-up in 2 weeks for labs with chemo.  A referral was made to [facility] for possible surgery in the future.  Today he presents because he has been unable to eat or drink since chemotherapy on Monday. He states he ate several slices of pizza prior to infusion and has had nothing since.  He has had severe nausea with vomiting. Denies bloody emesis but admits its yellow and "smells".  He has no appetite.  He has had very few liquids since chemotherapy.  He feels overall okay. He denies dizziness. He does admit to some mild abdominal discomfort that extends from epigastric to LUQ.  He has not taken any pain medication.  He  received a Neulasta patch yesterday and he thought this patch was for pain.  He rates his pain an 8 out of 10.  He denies fever or flulike illness. He admits to 2 days of diarrhea. Diarrhea yesterday was approximately 8 episodes. He has had 3 this morning.    ECOG FS:1 - Symptomatic but completely ambulatory  Review of systems- Review of Systems  Constitutional: Positive for malaise/fatigue and weight loss. Negative for chills and fever.  HENT: Negative.   Eyes: Negative.   Respiratory: Positive for shortness of breath and wheezing.   Cardiovascular: Negative.   Gastrointestinal: Positive for abdominal pain, diarrhea, nausea and vomiting. Negative for blood in stool, constipation and melena.  Genitourinary: Negative.   Musculoskeletal: Negative.   Skin: Negative.   Neurological: Positive for weakness.  Endo/Heme/Allergies: Negative.   Psychiatric/Behavioral: Negative.      Current treatment- FOLFIRINOX Cycle 1 on 12/4  No Known Allergies   Past Medical History:  Diagnosis Date  . Asthma   . Pancreatic mass   . Pancreatic mass      Past Surgical History:  Procedure Laterality Date  . ERCP N/A 04/21/2017   Procedure: ENDOSCOPIC RETROGRADE CHOLANGIOPANCREATOGRAPHY (ERCP);  Surgeon: Lucilla Lame, MD;  Location[facility] ENDOSCOPY;  Service: Endoscopy;  Laterality: N/A;  . PORTA CATH INSERTION N/A 04/18/2017   Procedure: PORTA CATH INSERTION;  Surgeon: Algernon Huxley, MD;  Location: [facility];  Service: Cardiovascular;  Laterality: N/A;    Social History   Socioeconomic History  . Marital status: Single    Spouse name: Not on file  . Number of children:  Not on file  . Years of education: Not on file  . Highest education level: Not on file  Social Needs  . Financial resource strain: Not hard at all  . Food insecurity - worry: Never true  . Food insecurity - inability: Never true  . Transportation needs - medical: No  . Transportation needs - non-medical: No    Occupational History  . Not on file  Tobacco Use  . Smoking status: Former Smoker    Packs/day: 0.25    Years: 5.00    Pack years: 1.25    Types: Cigarettes  . Smokeless tobacco: Never Used  Substance and Sexual Activity  . Alcohol use: No  . Drug use: No  . Sexual activity: Yes    Partners: Male  Other Topics Concern  . Not on file  Social History Narrative  . Not on file    Family History  Problem Relation Age of Onset  . Pancreatic cancer Mother 61  . Pancreatic cancer Maternal Grandmother 60     Current Outpatient Medications:  .  albuterol (PROVENTIL HFA;VENTOLIN HFA) 108 (90 Base) MCG/ACT inhaler, Inhale 1-2 puffs every 6 (six) hours as needed into the lungs for wheezing or shortness of breath., Disp: , Rfl:  .  fentaNYL (DURAGESIC - DOSED MCG/HR) 25 MCG/HR patch, Place 1 patch (25 mcg total) onto the skin every 3 (three) days., Disp: 3 patch, Rfl: 0 .  lidocaine-prilocaine (EMLA) cream, Apply 1 application topically as needed. Apply generously over the Mediport 45 minutes prior to chemotherapy. (Patient not taking: Reported on 04/29/2017), Disp: 30 g, Rfl: 0 .  loperamide (IMODIUM) 2 MG capsule, Take 1 capsule (2 mg total) by mouth as needed for diarrhea or loose stools., Disp: 30 capsule, Rfl: 0 .  ondansetron (ZOFRAN) 8 MG tablet, One pill every 8 hours as needed for nausea/vomitting. (Patient not taking: Reported on 04/29/2017), Disp: 40 tablet, Rfl: 1 .  oxyCODONE-acetaminophen (PERCOCET/ROXICET) 5-325 MG tablet, Take 1 tablet by mouth every 6 (six) hours as needed., Disp: 30 tablet, Rfl: 0 .  prochlorperazine (COMPAZINE) 10 MG tablet, Take 1 tablet (10 mg total) by mouth every 6 (six) hours as needed for nausea or vomiting., Disp: 30 tablet, Rfl: 0  Physical exam:  Vitals:   04/29/17 1218  BP: 113/78  Pulse: 66  Resp: 18  Temp: 97.8 F (36.6 C)  TempSrc: Oral  Weight: 127 lb (57.6 kg)  Height: 5\' 3"  (1.6 m)   Physical Exam  Constitutional: He is oriented  to person, place, and time and well-developed, well-nourished, and in no distress.  HENT:  Head: Normocephalic and atraumatic.  Neck: Normal range of motion. Neck supple.  Cardiovascular: Normal rate, regular rhythm and normal heart sounds.  Pulmonary/Chest: Effort normal. He has wheezes.  Abdominal: Soft. Bowel sounds are normal.  Musculoskeletal: Normal range of motion.  Neurological: He is alert and oriented to person, place, and time.  Skin: Skin is warm and dry.     CMP Latest Ref Rng & Units 04/30/2017  Glucose 65 - 99 mg/dL 93  BUN 6 - 20 mg/dL 17  Creatinine 0.61 - 1.24 mg/dL 0.72  Sodium 135 - 145 mmol/L 133(L)  Potassium 3.5 - 5.1 mmol/L 4.1  Chloride 101 - 111 mmol/L 100(L)  CO2 22 - 32 mmol/L 23  Calcium 8.9 - 10.3 mg/dL 9.2  Total Protein 6.5 - 8.1 g/dL 7.2  Total Bilirubin 0.3 - 1.2 mg/dL 3.8(H)  Alkaline Phos 38 - 126 U/L 272(H)  AST  15 - 41 U/L 48(H)  ALT 17 - 63 U/L 125(H)   CBC Latest Ref Rng & Units 04/30/2017  WBC 3.8 - 10.6 K/uL 36.7(H)  Hemoglobin 13.0 - 18.0 g/dL 12.9(L)  Hematocrit 40.0 - 52.0 % 37.8(L)  Platelets 150 - 440 K/uL 354    No images are attached to the encounter.  Ct Chest W Contrast  Result Date: 04/15/2017 CLINICAL DATA:  Staging evaluation. Newly diagnosed pancreatic cancer. EXAM: CT CHEST WITH CONTRAST TECHNIQUE: Multidetector CT imaging of the chest was performed during intravenous contrast administration. CONTRAST:  96mL ISOVUE-300 IOPAMIDOL (ISOVUE-300) INJECTION 61% COMPARISON:  CT abdomen pelvis 04/01/2017; CT head of pelvis 03/23/2017. FINDINGS: Cardiovascular: Normal heart size. No pericardial effusion. Coronary artery vascular calcifications. Aorta main pulmonary artery normal in caliber. Mediastinum/Nodes: Small hiatal hernia. No enlarged axillary, mediastinal or hilar lymphadenopathy. Lungs/Pleura: Central airways are patent. Bilateral regions of architectural distortion, bronchiectasis and linear opacities are demonstrated  compatible with pulmonary parenchymal scarring. There is a 4 mm left lower lobe nodule (image 86; series 3). No pleural effusion or pneumothorax. Upper Abdomen: Incompletely visualized biliary ductal and pancreatic ductal dilatation. Incompletely visualized pancreatic masses. No acute process. Musculoskeletal: Thoracic spine degenerative changes. No aggressive or acute appearing osseous lesions. IMPRESSION: 1. Bilateral pulmonary parenchymal findings most compatible with scarring and fibrosis. Possibility of interstitial lung disease is not excluded. Consider short-term follow-up high-resolution chest CT. 2. No definite evidence of pulmonary metastatic disease although evaluation is somewhat limited secondary to extensive parenchymal scarring and fibrosis. 3. Emphysema (ICD10-J43.9). Electronically Signed   By: Lovey Newcomer M.D.   On: 04/15/2017 14:06   Ct Abdomen Pelvis W Contrast  Result Date: 04/30/2017 CLINICAL DATA:  Recent diagnosis of pancreatic cancer. The patient received the first round of chemotherapy. On lab work, LFTs and bilirubin were elevated. EXAM: CT ABDOMEN AND PELVIS WITH CONTRAST TECHNIQUE: Multidetector CT imaging of the abdomen and pelvis was performed using the standard protocol following bolus administration of intravenous contrast. CONTRAST:  132mL ISOVUE-300 IOPAMIDOL (ISOVUE-300) INJECTION 61% COMPARISON:  April 01, 2017 and March 23, 2017 FINDINGS: Lower chest: Scarring is seen in the left base and anterior right base, unchanged. No metastatic lesions or evidence of pneumonia. The lung bases are otherwise unremarkable. Hepatobiliary: A biliary stent has been placed in the interval extending from central hepatic duct, through the common bile duct, into the region of the inferior pancreatic head near the junction with the duodenum. There is significant dilatation of the intra and extrahepatic bile ducts which is actually increased since March 23, 2017 despite the stent. The common  bile duct on image 25 measures at least 14 mm. The gallbladder is mildly distended. Air in the intrahepatic bile ducts is consistent with a stent. The portal vein remains patent within the porta hepatis and liver. It is narrowed by the known malignancy as seen on series 2, image 24, similar in the interval. No liver masses are seen. Pancreas: The patient's known pancreatic malignancy is again identified in the neck measuring 3.1 by 2.2 cm on today's study. The main portal vein is narrowed as above. Neither the celiac or SMA trunks are encased. The SMV it appears remain patent as well. There is pancreatic dilatation more distally in the pancreas. There is likely dilatation of a pancreatic side duct on image 19. Spleen: Normal in size without focal abnormality. Adrenals/Urinary Tract: The adrenal glands are normal. There is a probable right renal cyst too small to characterize. The kidneys are otherwise normal as is  the bladder. No ureteral stones identified. Stomach/Bowel: The stomach and small bowel are unremarkable with no obstruction. The colon and visualized appendix are normal. Vascular/Lymphatic: Mild atherosclerotic changes in the abdominal aorta. No discrete adenopathy identified. Reproductive: Prostate is unremarkable. Other: No abdominal wall hernia or abnormality. No abdominopelvic ascites. Musculoskeletal: No acute or significant osseous findings. IMPRESSION: 1. The biliary stent extends from a hepatic branch of the biliary tree, through the common bile duct, into the pancreatic head. It does not appear that the stent actually exits the pancreatic head on today's study but it is close to the junction of the pancreatic head and duodenum. Despite the stent, there is increasing intra and extrahepatic biliary duct dilatation which explains the patient's symptoms. 2. Changes of pancreatic carcinoma as described above. The carcinoma narrows the main portal vein. 3. Mild atherosclerotic change in the abdominal  aorta. Electronically Signed   By: Dorise Bullion III M.D   On: 04/30/2017 01:33   Dg C-arm 1-60 Min-no Report  Result Date: 04/21/2017 Fluoroscopy was utilized by the requesting physician.  No radiographic interpretation.     Assessment and plan- Patient is a 46 y.o. male who presents for dehydration, nausea, wheezing and diarrhea.  Labs were drawn indicating hyponatremia (133), leukocytosis (18.2), elevated liver enzymes and a slightly rising total bilirubin (4.3). On initial assessment patient appears well with stable vital signs.   1. Dehydration: Patient PORT was accessed and he received 1 liter NaCl. 2. Wheezing:10 mg Decadron IV. Patient has underlying COPD/emphysema. 3. Nausea/Vomiting: 8 mg Zofran IV.  Rx Compazine 10 mg q 6. Patient and instructed to continue Zofran as needed. 4. Abdominal Pain: 2 mg Morphine IV.  Encourage patient to take medication as prescribed and apply his fentanyl patch when he gets home. 5. Leukocytosis: 18.2 today.  Patient does receive ONPRO Neulasta but patch was applied with the discontinuation of his pump. His pump was discontinued yesterday. Medication is given 26 hours after placement.  Will get daily labs and patient to follow trend. 5. Consulted Dr. Rogue Bussing regarding abnormal lab results and he wished to consult Dr. Allen Norris with GI regarding slightly elevated bilirubin.  Spoke to Dr. Allen Norris and due to patient's leukocytosis, abdominal pain and rising bilirubin he suggested direct admission to hospital with ERCP tomorrow. Dr. Rogue Bussing will be consulted.    Visit Diagnosis 1. Malignant tumor of body of pancreas ([place])   2. Epigastric pain   3. Total bilirubin, elevated   4. Leukocytosis, unspecified type     Patient expressed understanding and was in agreement with this plan. He also understands that He can call clinic at any time with any questions, concerns, or complaints.   Greater than 50% was spent in counseling and coordination of care with  this patient including but not limited to discussion of the relevant topics above (See A&P) including, but not limited to diagnosis and management of acute and chronic medical conditions.    Faythe Casa, AGNP-C [facility] [place]- [phone] Pager- [phone] 05/03/2017 10:07 AM

## 2017-04-30 ENCOUNTER — Encounter: Payer: Self-pay | Admitting: Oncology

## 2017-04-30 ENCOUNTER — Encounter
Admission: AD | Disposition: A | Discharge status: Home / Self Care | Payer: Self-pay | Source: Ambulatory Visit | Attending: Internal Medicine

## 2017-04-30 DIAGNOSIS — R944 Abnormal results of kidney function studies: Secondary | ICD-10-CM | POA: Diagnosis not present

## 2017-04-30 DIAGNOSIS — D72829 Elevated white blood cell count, unspecified: Secondary | ICD-10-CM | POA: Diagnosis not present

## 2017-04-30 DIAGNOSIS — R109 Unspecified abdominal pain: Secondary | ICD-10-CM | POA: Diagnosis not present

## 2017-04-30 DIAGNOSIS — K831 Obstruction of bile duct: Secondary | ICD-10-CM

## 2017-04-30 DIAGNOSIS — Z87891 Personal history of nicotine dependence: Secondary | ICD-10-CM

## 2017-04-30 DIAGNOSIS — C259 Malignant neoplasm of pancreas, unspecified: Secondary | ICD-10-CM

## 2017-04-30 DIAGNOSIS — J45909 Unspecified asthma, uncomplicated: Secondary | ICD-10-CM | POA: Diagnosis not present

## 2017-04-30 DIAGNOSIS — R197 Diarrhea, unspecified: Secondary | ICD-10-CM | POA: Diagnosis not present

## 2017-04-30 DIAGNOSIS — C801 Malignant (primary) neoplasm, unspecified: Secondary | ICD-10-CM | POA: Diagnosis not present

## 2017-04-30 DIAGNOSIS — R112 Nausea with vomiting, unspecified: Secondary | ICD-10-CM | POA: Diagnosis not present

## 2017-04-30 LAB — CBC
HEMATOCRIT: 37.8 % — AB (ref 40.0–52.0)
Hemoglobin: 12.9 g/dL — ABNORMAL LOW (ref 13.0–18.0)
MCH: 30.5 pg (ref 26.0–34.0)
MCHC: 34.1 g/dL (ref 32.0–36.0)
MCV: 89.4 fL (ref 80.0–100.0)
Platelets: 354 10*3/uL (ref 150–440)
RBC: 4.23 MIL/uL — ABNORMAL LOW (ref 4.40–5.90)
RDW: 14.3 % (ref 11.5–14.5)
WBC: 36.7 10*3/uL — ABNORMAL HIGH (ref 3.8–10.6)

## 2017-04-30 LAB — COMPREHENSIVE METABOLIC PANEL
ALT: 125 U/L — AB (ref 17–63)
ANION GAP: 10 (ref 5–15)
AST: 48 U/L — ABNORMAL HIGH (ref 15–41)
Albumin: 3.4 g/dL — ABNORMAL LOW (ref 3.5–5.0)
Alkaline Phosphatase: 272 U/L — ABNORMAL HIGH (ref 38–126)
BUN: 17 mg/dL (ref 6–20)
CHLORIDE: 100 mmol/L — AB (ref 101–111)
CO2: 23 mmol/L (ref 22–32)
CREATININE: 0.72 mg/dL (ref 0.61–1.24)
Calcium: 9.2 mg/dL (ref 8.9–10.3)
Glucose, Bld: 93 mg/dL (ref 65–99)
Potassium: 4.1 mmol/L (ref 3.5–5.1)
SODIUM: 133 mmol/L — AB (ref 135–145)
TOTAL PROTEIN: 7.2 g/dL (ref 6.5–8.1)
Total Bilirubin: 3.8 mg/dL — ABNORMAL HIGH (ref 0.3–1.2)

## 2017-04-30 SURGERY — ERCP, WITH INTERVENTION IF INDICATED
Anesthesia: General

## 2017-04-30 MED ORDER — HEPARIN SOD (PORK) LOCK FLUSH 100 UNIT/ML IV SOLN
500.0000 [IU] | INTRAVENOUS | Status: DC | PRN
  Filled 2017-04-30: qty 5

## 2017-04-30 NOTE — Progress Notes (Signed)
Patient is to be discharged today. Patient is in no acute distress at this time, and assessment is unchanged from this morning. Patient's IV is out, discharge paperwork has been discussed with patient/family and there are no questions or concerns at this time. Patient will be accompanied downstairs by staff and family via wheelchair.   

## 2017-04-30 NOTE — Consult Note (Signed)
[place] CONSULT NOTE  Patient Care Team: Maeola Sarah, [name], RN as Registered Nurse  CHIEF COMPLAINTS/PURPOSE OF CONSULTATION:  Pancreatic cancer/status post chemotherapy  HISTORY OF PRESENTING ILLNESS:  Nathaniel Saunders 46 y.o.  male unfortunate patient diagnosed with borderline resectable pancreatic cancer-currently on neoadjuvant chemotherapy with FOLFIRINOX-status post cycle number 1 day 5 today.  Approximately 10 days ago patient had ERCP with stent placement for biliary obstruction.  At the time of chemotherapy-patient's bilirubin had trended down to 3.2. Patient presented to the clinic-with ongoing nausea vomiting abdominal pain diarrhea; Patient was seen in the symptom management clinic-received IV fluids; steroids pain medication.  Patient noted to have bilirubin around 3; slightly elevated LFTs; and also leukocytosis.  No fevers or chills.   Patient was then admitted to the hospital for further evaluation recommendations-GI evaluation including ERCP.  This morning patient denies any abdominal pain.  Appetite improved.  No diarrhea.  Interested in eating.  ROS: A complete 10 point review of system is done which is negative except mentioned above in history of present illness  MEDICAL HISTORY:  Past Medical History:  Diagnosis Date  . Asthma   . Pancreatic mass   . Pancreatic mass     SURGICAL HISTORY: Past Surgical History:  Procedure Laterality Date  . ERCP N/A 04/21/2017   Procedure: ENDOSCOPIC RETROGRADE CHOLANGIOPANCREATOGRAPHY (ERCP);  Surgeon: Lucilla Lame, MD;  Location: [facility] ENDOSCOPY;  Service: Endoscopy;  Laterality: N/A;  . PORTA CATH INSERTION N/A 04/18/2017   Procedure: PORTA CATH INSERTION;  Surgeon: Algernon Huxley, MD;  Location: [facility];  Service: Cardiovascular;  Laterality: N/A;    SOCIAL HISTORY: Social History   Socioeconomic History  . Marital status: Single     Spouse name: Not on file  . Number of children: Not on file  . Years of education: Not on file  . Highest education level: Not on file  Social Needs  . Financial resource strain: Not hard at all  . Food insecurity - worry: Never true  . Food insecurity - inability: Never true  . Transportation needs - medical: No  . Transportation needs - non-medical: No  Occupational History  . Not on file  Tobacco Use  . Smoking status: Former Smoker    Packs/day: 0.25    Years: 5.00    Pack years: 1.25    Types: Cigarettes  . Smokeless tobacco: Never Used  Substance and Sexual Activity  . Alcohol use: No  . Drug use: No  . Sexual activity: Yes    Partners: Male  Other Topics Concern  . Not on file  Social History Narrative  . Not on file    FAMILY HISTORY: Family History  Problem Relation Age of Onset  . Pancreatic cancer Mother 25  . Pancreatic cancer Maternal Grandmother 60    ALLERGIES:  has No Known Allergies.  MEDICATIONS:  Current Facility-Administered Medications  Medication Dose Route Frequency Provider Last Rate Last Dose  . albuterol (PROVENTIL) (2.5 MG/3ML) 0.083% nebulizer solution 3 mL  3 mL Inhalation Q6H PRN Vaughan Basta, MD      . docusate sodium (COLACE) capsule 100 mg  100 mg Oral BID PRN Vaughan Basta, MD      . fentaNYL ([place] - dosed mcg/hr) patch 25 mcg  25 mcg Transdermal Q72H Vaughan Basta, MD   25 mcg at 04/29/17 1841  . loperamide (IMODIUM) capsule 2 mg  2 mg Oral PRN Anselm Jungling,  Rosalio Macadamia, MD      . oxyCODONE-acetaminophen (PERCOCET/ROXICET) 5-325 MG per tablet 1 tablet  1 tablet Oral Q6H PRN Vaughan Basta, MD      . prochlorperazine (COMPAZINE) tablet 10 mg  10 mg Oral Q6H PRN Vaughan Basta, MD   10 mg at 04/30/17 0510      .  PHYSICAL EXAMINATION:  Vitals:   04/30/17 0839 04/30/17 1352  BP: (!) 123/52 109/69  Pulse: 71 72  Resp: 16 12  Temp: 97.8 F (36.6 C) 97.6 F (36.4 C)  SpO2: 100%  100%   Filed Weights   04/29/17 1620  Weight: 127 lb 4.8 oz (57.7 kg)    GENERAL: Well-nourished well-developed; Alert, no distress and comfortable.   Alone. EYES: no pallor or icterus OROPHARYNX: no thrush or ulceration. NECK: supple, no masses felt LYMPH:  no palpable lymphadenopathy in the cervical, axillary or inguinal regions LUNGS: decreased breath sounds to auscultation at bases and  No wheeze or crackles HEART/CVS: regular rate & rhythm and no murmurs; No lower extremity edema ABDOMEN: abdomen soft, non-tender and normal bowel sounds Musculoskeletal:no cyanosis of digits and no clubbing  PSYCH: alert & oriented x 3 with fluent speech NEURO: no focal motor/sensory deficits SKIN:  no rashes or significant lesions  LABORATORY DATA:  I have reviewed the data as listed Lab Results  Component Value Date   WBC 36.7 (H) 04/30/2017   HGB 12.9 (L) 04/30/2017   HCT 37.8 (L) 04/30/2017   MCV 89.4 04/30/2017   PLT 354 04/30/2017   Recent Labs    03/24/17 1537  04/22/17 0443 04/26/17 0840 04/29/17 1207 04/30/17 0511  NA  --    < > 137 136 133* 133*  K  --    < > 3.8 3.3* 3.8 4.1  CL  --    < > 109 103 100* 100*  CO2  --    < > 22 27 24 23   GLUCOSE  --    < > 94 158* 89 93  BUN  --    < > 8 10 14 17   CREATININE  --    < > 0.64 0.74 0.77 0.72  CALCIUM  --    < > 8.7* 8.9 9.3 9.2  GFRNONAA  --    < > >60 >60 >60 >60  GFRAA  --    < > >60 >60 >60 >60  PROT 8.0   < > 6.5 7.7 7.8 7.2  ALBUMIN 4.2   < > 2.9* 3.6 3.6 3.4*  AST 21   < > 198* 64* 46* 48*  ALT 13*   < > 512* 237* 142* 125*  ALKPHOS 54   < > 417* 315* 279* 272*  BILITOT 0.6   < > 6.5* 3.7* 4.3* 3.8*  BILIDIR 0.1  --  3.0* 1.8*  --   --   IBILI 0.5  --  3.5* 1.9*  --   --    < > = values in this interval not displayed.    RADIOGRAPHIC STUDIES: I have personally reviewed the radiological images as listed and agreed with the findings in the report. Ct Chest W Contrast  Result Date: 04/15/2017 CLINICAL DATA:   Staging evaluation. Newly diagnosed pancreatic cancer. EXAM: CT CHEST WITH CONTRAST TECHNIQUE: Multidetector CT imaging of the chest was performed during intravenous contrast administration. CONTRAST:  16mL ISOVUE-300 IOPAMIDOL (ISOVUE-300) INJECTION 61% COMPARISON:  CT abdomen pelvis 04/01/2017; CT head of pelvis 03/23/2017. FINDINGS: Cardiovascular: Normal heart size. No pericardial effusion.  Coronary artery vascular calcifications. Aorta main pulmonary artery normal in caliber. Mediastinum/Nodes: Small hiatal hernia. No enlarged axillary, mediastinal or hilar lymphadenopathy. Lungs/Pleura: Central airways are patent. Bilateral regions of architectural distortion, bronchiectasis and linear opacities are demonstrated compatible with pulmonary parenchymal scarring. There is a 4 mm left lower lobe nodule (image 86; series 3). No pleural effusion or pneumothorax. Upper Abdomen: Incompletely visualized biliary ductal and pancreatic ductal dilatation. Incompletely visualized pancreatic masses. No acute process. Musculoskeletal: Thoracic spine degenerative changes. No aggressive or acute appearing osseous lesions. IMPRESSION: 1. Bilateral pulmonary parenchymal findings most compatible with scarring and fibrosis. Possibility of interstitial lung disease is not excluded. Consider short-term follow-up high-resolution chest CT. 2. No definite evidence of pulmonary metastatic disease although evaluation is somewhat limited secondary to extensive parenchymal scarring and fibrosis. 3. Emphysema (ICD10-J43.9). Electronically Signed   By: Lovey Newcomer M.D.   On: 04/15/2017 14:06   Ct Abdomen Pelvis W Contrast  Result Date: 04/30/2017 CLINICAL DATA:  Recent diagnosis of pancreatic cancer. The patient received the first round of chemotherapy. On lab work, LFTs and bilirubin were elevated. EXAM: CT ABDOMEN AND PELVIS WITH CONTRAST TECHNIQUE: Multidetector CT imaging of the abdomen and pelvis was performed using the standard  protocol following bolus administration of intravenous contrast. CONTRAST:  179mL ISOVUE-300 IOPAMIDOL (ISOVUE-300) INJECTION 61% COMPARISON:  April 01, 2017 and March 23, 2017 FINDINGS: Lower chest: Scarring is seen in the left base and anterior right base, unchanged. No metastatic lesions or evidence of pneumonia. The lung bases are otherwise unremarkable. Hepatobiliary: A biliary stent has been placed in the interval extending from central hepatic duct, through the common bile duct, into the region of the inferior pancreatic head near the junction with the duodenum. There is significant dilatation of the intra and extrahepatic bile ducts which is actually increased since March 23, 2017 despite the stent. The common bile duct on image 25 measures at least 14 mm. The gallbladder is mildly distended. Air in the intrahepatic bile ducts is consistent with a stent. The portal vein remains patent within the porta hepatis and liver. It is narrowed by the known malignancy as seen on series 2, image 24, similar in the interval. No liver masses are seen. Pancreas: The patient's known pancreatic malignancy is again identified in the neck measuring 3.1 by 2.2 cm on today's study. The main portal vein is narrowed as above. Neither the celiac or SMA trunks are encased. The SMV it appears remain patent as well. There is pancreatic dilatation more distally in the pancreas. There is likely dilatation of a pancreatic side duct on image 19. Spleen: Normal in size without focal abnormality. Adrenals/Urinary Tract: The adrenal glands are normal. There is a probable right renal cyst too small to characterize. The kidneys are otherwise normal as is the bladder. No ureteral stones identified. Stomach/Bowel: The stomach and small bowel are unremarkable with no obstruction. The colon and visualized appendix are normal. Vascular/Lymphatic: Mild atherosclerotic changes in the abdominal aorta. No discrete adenopathy identified.  Reproductive: Prostate is unremarkable. Other: No abdominal wall hernia or abnormality. No abdominopelvic ascites. Musculoskeletal: No acute or significant osseous findings. IMPRESSION: 1. The biliary stent extends from a hepatic branch of the biliary tree, through the common bile duct, into the pancreatic head. It does not appear that the stent actually exits the pancreatic head on today's study but it is close to the junction of the pancreatic head and duodenum. Despite the stent, there is increasing intra and extrahepatic biliary duct dilatation which explains  the patient's symptoms. 2. Changes of pancreatic carcinoma as described above. The carcinoma narrows the main portal vein. 3. Mild atherosclerotic change in the abdominal aorta. Electronically Signed   By: Dorise Bullion III M.D   On: 04/30/2017 01:33   Dg C-arm 1-60 Min-no Report  Result Date: 04/21/2017 Fluoroscopy was utilized by the requesting physician.  No radiographic interpretation.   Ct Pancreas Abd W/wo  Result Date: 04/01/2017 CLINICAL DATA:  Pancreas cancer EXAM: CT ABDOMEN WITHOUT AND WITH CONTRAST TECHNIQUE: Multidetector CT imaging of the abdomen was performed following the standard protocol before and following the bolus administration of intravenous contrast. CONTRAST:  165mL ISOVUE-370 IOPAMIDOL (ISOVUE-370) INJECTION 76% COMPARISON:  None. FINDINGS: Lower chest: Scarring and architectural distortion within the right middle lobe and left lower lobe identified. Moderate changes of centrilobular emphysema identified. No pleural effusion. Hepatobiliary: No focal liver abnormality. The gallbladder is normal. The common bile duct is increased in caliber proximally measuring 9 mm. Focal narrowing of the duct at the level of the pancreatic head noted. Pancreas: Hypoenhancing mass within the head of pancreas measures 1.8 by 2.2 by 2.3 cm. Within the neck, body and tail of pancreas there is dilatation of the pancreatic duct. The main  portal vein is completely encased and narrowed by the mass, image 35 of series 5 and image 62 of series 6. The superior mesenteric artery is uninvolved. The celiac trunk appears uninvolved. The lesion touches a portion of the common hepatic artery but does not completely encased the vessel, image 64 of series 6. Cystic lesion within the tail of pancreas measures 6 mm. Within the distal tail of pancreas there is an area of relative hypoenhancement which may represent a second mass. This measures 1.7 x 1.4 cm, image 21 of series 4. Spleen: Normal in size without focal abnormality. Adrenals/Urinary Tract: Normal appearance of the adrenal glands. Right kidney cysts. No mass or hydronephrosis. Stomach/Bowel: Stomach is within normal limits. Appendix appears normal. No evidence of bowel wall thickening, distention, or inflammatory changes. Vascular/Lymphatic: Aortic atherosclerosis. No aneurysm. No adenopathy. Other: No abdominal wall hernia or abnormality. Musculoskeletal: No acute or significant osseous findings. IMPRESSION: 1. Again seen is a hypoenhancing mass within the head of pancreas with associated common bile duct dilatation, pancreatic duct dilatation and encasement and narrowing of the main portal vein. This is highly worrisome for pancreatic adenocarcinoma. No evidence for celiac trunk or superior mesenteric artery encasement. No evidence for liver metastasis or upper abdominal adenopathy. 2. A second indeterminate hypoenhancing lesion is noted within the distal tail of pancreas. A third lesion within the pancreatic tail has a benign cystic appearance. 3.  Aortic Atherosclerosis (ICD10-I70.0). Electronically Signed   By: Kerby Moors M.D.   On: 04/01/2017 12:06    ASSESSMENT & PLAN:   #46 year old male patient with newly diagnosed borderline resectable pancreatic cancer-currently status post first cycle of FOLFIRINOX is currently admitted to hospital for nausea vomiting diarrhea abdominal  pain.  #Elevated bilirubin/LFTs- Currently status post ERCP stent placement approximately 2 weeks ago.  In general improved since diagnosed with biliary obstruction secondary to pancreatic cancer.  However bilirubin has plateaued around 3.   However the CT scan shows continued increasing dilatation of the biliary tree.  Awaiting GI evaluation; repeat ERCP.  # Nausea vomiting diarrhea-likely secondary to recent chemotherapy FOLFIRINOX.  Status post IV fluids improved.  Patient will likely need dose reductions frequent cycles.  #Pancreatic cancer-borderline resectable; currently on neoadjuvant chemotherapy FOLFIRINOX day #5; given the poor tolerance I suspect  patient might require further dose attenuation.  Leukocytosis likely secondary to-Neulasta.  #Abdominal pain-secondary to underlying pancreatic cancer continue fentanyl patch and hydrocodone.  Thank you Dr.Patel for allowing me to participate in the care of your pleasant patient. Please do not hesitate to contact me with questions or concerns in the interim. Discussed with Dr.Patel-patient could potentially be discharged after ERCP.   All questions were answered. The patient knows to call the clinic with any problems, questions or concerns.    Cammie Sickle, MD 04/30/2017 2:36 PM

## 2017-04-30 NOTE — Consult Note (Signed)
Cephas Darby, MD [address]  [place]  [place], [place] [address]  Main: [phone]  Fax: [phone] Pager: [phone]   Consultation  Referring Provider:     No ref. provider found Primary Care Physician:  Maeola Sarah, MD Primary Gastroenterologist:  Dr. Allen Norris          Reason for Consultation:     Jaundice, abdominal pain  Date of Admission:  04/29/2017 Date of Consultation:  04/30/2017         HPI:   Nathaniel Saunders is a 46 y.o. black male with recent diagnosis of pancreatic adenocarcinoma that resulted in biliary obstruction status post ERCP in November 2018. Patient is currently on neoadjuvant chemotherapy FOLFIRINOX, last chemo was on 04/26/2017. Patient reports that since chemotherapy he has been having upper abdominal pain associated with nausea, vomiting, unable to tolerate by mouth including liquids, he lost about 7 pounds in last 1 week. He also had few episodes of nonbloody diarrhea for 2 days that resolved. He reports having chills, but denies any fever. He was sent to the ER from oncology clinic yesterday due to elevated bilirubin, dehydration and admitted for possible ERCP.  Since admission, patient has been receiving IV fluids, his pain is under control, he has been able to tolerate full liquid diet with mild upper abdominal discomfort.  His LFTs have significantly improved and continued to improve since ERCP. His bilirubin improved from 14.8 to 3.8 today. He does have elevated leukocytosis but he is on Neulasta pump replaced with patch and he reported that he removed patch yesterday at 7 PM. Today his white count is 36K.  He had CT A/P yesterday which revealed biliary stent in place, and increasing intra-and extrahepatic biliary ductal dilation  NSAIDs: None  Antiplts/Anticoagulants/Anti thrombotics: None  GI Procedures: ERCP 04/21/2017 - A localized biliary stricture was found. - The entire main bile duct was dilated. - A biliary  sphincterotomy was performed. - One plastic stent was placed into the common bile duct.  Past Medical History:  Diagnosis Date  . Asthma   . Pancreatic mass   . Pancreatic mass     Past Surgical History:  Procedure Laterality Date  . ERCP N/A 04/21/2017   Procedure: ENDOSCOPIC RETROGRADE CHOLANGIOPANCREATOGRAPHY (ERCP);  Surgeon: Lucilla Lame, MD;  Location: [facility] ENDOSCOPY;  Service: Endoscopy;  Laterality: N/A;  . PORTA CATH INSERTION N/A 04/18/2017   Procedure: PORTA CATH INSERTION;  Surgeon: Algernon Huxley, MD;  Location: [facility] CV LAB;  Service: Cardiovascular;  Laterality: N/A;    Prior to Admission medications   Medication Sig Start Date End Date Taking? Authorizing Provider  albuterol (PROVENTIL HFA;VENTOLIN HFA) 108 (90 Base) MCG/ACT inhaler Inhale 1-2 puffs every 6 (six) hours as needed into the lungs for wheezing or shortness of breath.    [provider]  fentaNYL (DURAGESIC - DOSED MCG/HR) 25 MCG/HR patch Place 1 patch (25 mcg total) onto the skin every 3 (three) days. 04/29/17   Jacquelin Hawking, NP  lidocaine-prilocaine (EMLA) cream Apply 1 application topically as needed. Apply generously over the Mediport 45 minutes prior to chemotherapy. Patient not taking: Reported on 04/29/2017 04/12/17   Cammie Sickle, MD  loperamide (IMODIUM) 2 MG capsule Take 1 capsule (2 mg total) by mouth as needed for diarrhea or loose stools. 04/29/17   Jacquelin Hawking, NP  ondansetron (ZOFRAN) 8 MG tablet One pill every 8 hours as needed for nausea/vomitting. Patient not taking: Reported on 04/29/2017 04/12/17  Cammie Sickle, MD  oxyCODONE-acetaminophen (PERCOCET/ROXICET) 5-325 MG tablet Take 1 tablet by mouth every 6 (six) hours as needed. 04/29/17   Jacquelin Hawking, NP  prochlorperazine (COMPAZINE) 10 MG tablet Take 1 tablet (10 mg total) by mouth every 6 (six) hours as needed for nausea or vomiting. 04/29/17   Jacquelin Hawking, NP    Family History  Problem  Relation Age of Onset  . Pancreatic cancer Mother 36  . Pancreatic cancer Maternal Grandmother 76     Social History   Tobacco Use  . Smoking status: Former Smoker    Packs/day: 0.25    Years: 5.00    Pack years: 1.25    Types: Cigarettes  . Smokeless tobacco: Never Used  Substance Use Topics  . Alcohol use: No  . Drug use: No    Allergies as of 04/29/2017  . (No Known Allergies)    Review of Systems:    All systems reviewed and negative except where noted in HPI.   Physical Exam:  Vital signs in last 24 hours: Temp:  [97.6 F (36.4 C)-98.8 F (37.1 C)] 97.6 F (36.4 C) (12/08 1352) Pulse Rate:  [62-75] 72 (12/08 1352) Resp:  [12-18] 12 (12/08 1352) BP: (107-125)/(52-75) 109/69 (12/08 1352) SpO2:  [98 %-100 %] 100 % (12/08 1352) Weight:  [127 lb 4.8 oz (57.7 kg)] 127 lb 4.8 oz (57.7 kg) (12/07 1620) Last BM Date: 04/29/17   General:   Pleasant, cooperative in NAD, cachectic Head:  Normocephalic and atraumatic, bitemporal wasting . Eyes:     mild scleral icterus.   Conjunctiva pink. PERRLA. Ears:  Normal auditory acuity. Neck:  Supple; no masses or thyroidomegaly Lungs: Respirations even and unlabored. Lungs clear to auscultation bilaterally.   No wheezes, crackles, or rhonchi.  Heart:  Regular rate and rhythm;  Without murmur, clicks, rubs or gallops Abdomen:  Soft, nondistended, minimal epigastric tenderness. Normal bowel sounds. No appreciable masses or hepatomegaly.  No rebound or guarding.  Rectal:  Not performed. Msk:  Symmetrical without gross deformities.  Strength normal Extremities:  Without edema, cyanosis or clubbing. Neurologic:  Alert and oriented x3;  grossly normal neurologically. Skin:  Intact without significant lesions or rashes. Psych:  Alert and cooperative. Normal affect.  LAB RESULTS: CBC Latest Ref Rng & Units 04/30/2017 04/29/2017 04/26/2017  WBC 3.8 - 10.6 K/uL 36.7(H) 18.4(H) 7.4  Hemoglobin 13.0 - 18.0 g/dL 12.9(L) 13.4 12.9(L)    Hematocrit 40.0 - 52.0 % 37.8(L) 39.5(L) 37.8(L)  Platelets 150 - 440 K/uL 354 392 349    BMET BMP Latest Ref Rng & Units 04/30/2017 04/29/2017 04/26/2017  Glucose 65 - 99 mg/dL 93 89 158(H)  BUN 6 - 20 mg/dL 17 14 10   Creatinine 0.61 - 1.24 mg/dL 0.72 0.77 0.74  Sodium 135 - 145 mmol/L 133(L) 133(L) 136  Potassium 3.5 - 5.1 mmol/L 4.1 3.8 3.3(L)  Chloride 101 - 111 mmol/L 100(L) 100(L) 103  CO2 22 - 32 mmol/L 23 24 27   Calcium 8.9 - 10.3 mg/dL 9.2 9.3 8.9    LFT Hepatic Function Latest Ref Rng & Units 04/30/2017 04/29/2017 04/26/2017  Total Protein 6.5 - 8.1 g/dL 7.2 7.8 7.7  Albumin 3.5 - 5.0 g/dL 3.4(L) 3.6 3.6  AST 15 - 41 U/L 48(H) 46(H) 64(H)  ALT 17 - 63 U/L 125(H) 142(H) 237(H)  Alk Phosphatase 38 - 126 U/L 272(H) 279(H) 315(H)  Total Bilirubin 0.3 - 1.2 mg/dL 3.8(H) 4.3(H) 3.7(H)  Bilirubin, Direct 0.1 - 0.5 mg/dL - -  1.8(H)     STUDIES: Ct Abdomen Pelvis W Contrast  Result Date: 04/30/2017 CLINICAL DATA:  Recent diagnosis of pancreatic cancer. The patient received the first round of chemotherapy. On lab work, LFTs and bilirubin were elevated. EXAM: CT ABDOMEN AND PELVIS WITH CONTRAST TECHNIQUE: Multidetector CT imaging of the abdomen and pelvis was performed using the standard protocol following bolus administration of intravenous contrast. CONTRAST:  117m ISOVUE-300 IOPAMIDOL (ISOVUE-300) INJECTION 61% COMPARISON:  April 01, 2017 and March 23, 2017 FINDINGS: Lower chest: Scarring is seen in the left base and anterior right base, unchanged. No metastatic lesions or evidence of pneumonia. The lung bases are otherwise unremarkable. Hepatobiliary: A biliary stent has been placed in the interval extending from central hepatic duct, through the common bile duct, into the region of the inferior pancreatic head near the junction with the duodenum. There is significant dilatation of the intra and extrahepatic bile ducts which is actually increased since March 23, 2017 despite the  stent. The common bile duct on image 25 measures at least 14 mm. The gallbladder is mildly distended. Air in the intrahepatic bile ducts is consistent with a stent. The portal vein remains patent within the porta hepatis and liver. It is narrowed by the known malignancy as seen on series 2, image 24, similar in the interval. No liver masses are seen. Pancreas: The patient's known pancreatic malignancy is again identified in the neck measuring 3.1 by 2.2 cm on today's study. The main portal vein is narrowed as above. Neither the celiac or SMA trunks are encased. The SMV it appears remain patent as well. There is pancreatic dilatation more distally in the pancreas. There is likely dilatation of a pancreatic side duct on image 19. Spleen: Normal in size without focal abnormality. Adrenals/Urinary Tract: The adrenal glands are normal. There is a probable right renal cyst too small to characterize. The kidneys are otherwise normal as is the bladder. No ureteral stones identified. Stomach/Bowel: The stomach and small bowel are unremarkable with no obstruction. The colon and visualized appendix are normal. Vascular/Lymphatic: Mild atherosclerotic changes in the abdominal aorta. No discrete adenopathy identified. Reproductive: Prostate is unremarkable. Other: No abdominal wall hernia or abnormality. No abdominopelvic ascites. Musculoskeletal: No acute or significant osseous findings. IMPRESSION: 1. The biliary stent extends from a hepatic branch of the biliary tree, through the common bile duct, into the pancreatic head. It does not appear that the stent actually exits the pancreatic head on today's study but it is close to the junction of the pancreatic head and duodenum. Despite the stent, there is increasing intra and extrahepatic biliary duct dilatation which explains the patient's symptoms. 2. Changes of pancreatic carcinoma as described above. The carcinoma narrows the main portal vein. 3. Mild atherosclerotic change  in the abdominal aorta. Electronically Signed   By: DDorise BullionIII M.D   On: 04/30/2017 01:33      Impression / Plan:   Nathaniel Saunders is a 46y.o. male with pancreatic neck cancer, biliary obstruction status post ERCP with biliary decompression status post chemotherapy, last dose on 04/26/2017 presents with nausea, vomiting, epigastric pain, weight loss and dehydration  Elevated LFTs: Significantly improved and continued to improve since biliary stent placement There is no evidence of ascending cholangitis Leukocytosis is secondary to patient receiving Neulasta No indication for urgent ERCP Follow-up with [facility] next week, recheck labs. Need for repeat ERCP will be determined based on the labs  Epigastric pain, nausea, vomiting: Most likely secondary to chemotherapy  Recommend starting Creon 72K with each meal, 36K with snack as patients with pancreatic cancer can have exocrine pancreatic insufficiency Pain control per oncology Tolerating full liquid diet Advance diet  Okay to discharge patient today from GI standpoint  Thank you for involving me in the care of this patient.      LOS: 1 day   Sherri Sear, MD  04/30/2017, 2:36 PM   Note: This dictation was prepared with Dragon dictation along with smaller phrase technology. Any transcriptional errors that result from this process are unintentional.

## 2017-05-03 ENCOUNTER — Other Ambulatory Visit: Payer: No Typology Code available for payment source

## 2017-05-03 ENCOUNTER — Telehealth: Payer: Self-pay

## 2017-05-03 ENCOUNTER — Ambulatory Visit: Payer: No Typology Code available for payment source | Admitting: Internal Medicine

## 2017-05-03 ENCOUNTER — Ambulatory Visit: Payer: No Typology Code available for payment source

## 2017-05-03 NOTE — Telephone Encounter (Signed)
Referral placed for surgical oncology. [name] prefers [facility] for referral. Referral called and [facility] states they are not in network. They will contact insurance company and let us know if they will be treated as in network. Searching for other surgeons within his network. Oncology Nurse Navigator Documentation  Navigator Location: [facility] (05/03/17 1600)   )Navigator Encounter Type: Telephone (05/03/17 1600) Telephone: [facility] Call (05/03/17 1600)                       Barriers/Navigation Needs: Coordination of Care (05/03/17 1600)   Interventions: Referrals;Coordination of Care (05/03/17 1600) Referrals: (surgery referral) (05/03/17 1600)                    Time Spent with Patient: 30 (05/03/17 1600)

## 2017-05-03 NOTE — Telephone Encounter (Signed)
I have spoken with [employer]. Dr. Donnal Moat at [facility] is in network for Mr. Palmeri. We will continue to pursue referral to [facility]. Oncology Nurse Navigator Documentation  Navigator Location: [facility] (05/03/17 1630)   )Navigator Encounter Type: Telephone (05/03/17 1630) Telephone: [facility] Call (05/03/17 1630)                                                  Time Spent with Patient: 15 (05/03/17 1630)

## 2017-05-03 NOTE — Discharge Summary (Signed)
[place] [facility] [place] NAME: Nathaniel Saunders    MR#:  [id number]  DATE OF BIRTH:  1971-04-16  DATE OF ADMISSION:  04/29/2017 ADMITTING PHYSICIAN: Vaughan Basta, MD  DATE OF DISCHARGE: 04/30/2017  PRIMARY CARE PHYSICIAN: Maeola Sarah, MD    ADMISSION DIAGNOSIS:  nausea  vomitting high bilirubin abd pain pancreatic cancer  DISCHARGE DIAGNOSIS:  NAusea and vomitng--improved H/o Pancreatic cancer s/p recent ERCP and stent placement SECONDARY DIAGNOSIS:   Past Medical History:  Diagnosis Date  . Asthma   . Pancreatic mass   . Pancreatic mass     HOSPITAL COURSE:  Nathaniel Saunders  is a 46 y.o. male with a known history of Recent diagnosis of pancreatic mass- s/p ERCP and Stent placed last week. Received first chemo and had neupogen after that. oday went for follow up. On  Lab work his LFTs and bilirubin were high. Pt did have one day c/o diarrhea, no pain or fever.  * Elevated LFTs and Bilirubin Likely obstruction of Bile duct stent  -pt is feeling a lot better. No vomiting. Tolerating liquids and wants to try soft diet -Seen by GI Dr Marius Ditch. No urgent indication for ERCP  * recent pancreatic cancer   On chemo--pt will follow up out pt with his oncologist  * Elevated WBcs   Likely due to chemo and neupogen.   He does not have fever, vomiting, so will not give Abx now.  * Asthma   No exacerbation     Overall pt doing well. D/c  home    CONSULTS OBTAINED:  Treatment Team:  Cammie Sickle, MD Lucilla Lame, MD Lin Landsman, MD  DRUG ALLERGIES:  No Known Allergies  DISCHARGE MEDICATIONS:   Allergies as of 04/30/2017   No Known Allergies     Medication List    TAKE these medications   albuterol 108 (90 Base) MCG/ACT inhaler Commonly known as:  PROVENTIL HFA;VENTOLIN HFA Inhale 1-2 puffs every 6 (six) hours as needed into the lungs for wheezing or shortness of breath. Notes to patient:   Make sure to separate doses by 6 hours   fentaNYL 25 MCG/HR patch Commonly known as:  DURAGESIC - dosed mcg/hr Place 1 patch (25 mcg total) onto the skin every 3 (three) days. Notes to patient:  Change every 3 days   lidocaine-prilocaine cream Commonly known as:  EMLA Apply 1 application topically as needed. Apply generously over the Mediport 45 minutes prior to chemotherapy.   loperamide 2 MG capsule Commonly known as:  IMODIUM Take 1 capsule (2 mg total) by mouth as needed for diarrhea or loose stools.   ondansetron 8 MG tablet Commonly known as:  ZOFRAN One pill every 8 hours as needed for nausea/vomitting. Notes to patient:  Make sure to separate by at least 8 hours   oxyCODONE-acetaminophen 5-325 MG tablet Commonly known as:  PERCOCET/ROXICET Take 1 tablet by mouth every 6 (six) hours as needed. Notes to patient:  Make sure to separate doses by 6 hours   prochlorperazine 10 MG tablet Commonly known as:  COMPAZINE Take 1 tablet (10 mg total) by mouth every 6 (six) hours as needed for nausea or vomiting. Notes to patient:  Make sure to separate by at least 6 hours       If you experience worsening of your admission symptoms, develop shortness of breath, life threatening emergency, suicidal or homicidal thoughts you must seek medical attention immediately by calling 911 or calling  your MD immediately  if symptoms less severe.  You Must read complete instructions/literature along with all the possible adverse reactions/side effects for all the Medicines you take and that have been prescribed to you. Take any new Medicines after you have completely understood and accept all the possible adverse reactions/side effects.   Please note  You were cared for by a hospitalist during your hospital stay. If you have any questions about your discharge medications or the care you received while you were in the hospital after you are discharged, you can call the unit and asked to speak with  the hospitalist on call if the hospitalist that took care of you is not available. Once you are discharged, your primary care physician will handle any further medical issues. Please note that NO REFILLS for any discharge medications will be authorized once you are discharged, as it is imperative that you return to your primary care physician (or establish a relationship with a primary care physician if you do not have one) for your aftercare needs so that they can reassess your need for medications and monitor your lab values. Today   SUBJECTIVE   No vomiting today. No abd pain  VITAL SIGNS:  Blood pressure 109/69, pulse 72, temperature 97.6 F (36.4 C), resp. rate 12, height 5\' 3"  (1.6 m), weight 57.7 kg (127 lb 4.8 oz), SpO2 100 %.  I/O:  No intake or output data in the 24 hours ending 05/03/17 1402  PHYSICAL EXAMINATION:  GENERAL:  46 y.o.-year-old patient lying in the bed with no acute distress.  EYES: Pupils equal, round, reactive to light and accommodation. No scleral icterus. Extraocular muscles intact.  HEENT: Head atraumatic, normocephalic. Oropharynx and nasopharynx clear.  NECK:  Supple, no jugular venous distention. No thyroid enlargement, no tenderness.  LUNGS: Normal breath sounds bilaterally, no wheezing, rales,rhonchi or crepitation. No use of accessory muscles of respiration.  CARDIOVASCULAR: S1, S2 normal. No murmurs, rubs, or gallops.  ABDOMEN: Soft, non-tender, non-distended. Bowel sounds present. No organomegaly or mass.  EXTREMITIES: No pedal edema, cyanosis, or clubbing.  NEUROLOGIC: Cranial nerves II through XII are intact. Muscle strength 5/5 in all extremities. Sensation intact. Gait not checked.  PSYCHIATRIC: The patient is alert and oriented x 3.  SKIN: No obvious rash, lesion, or ulcer.   DATA REVIEW:   CBC  Recent Labs  Lab 04/30/17 0511  WBC 36.7*  HGB 12.9*  HCT 37.8*  PLT 354    Chemistries  Recent Labs  Lab 04/29/17 1207 04/30/17 0511  NA  133* 133*  K 3.8 4.1  CL 100* 100*  CO2 24 23  GLUCOSE 89 93  BUN 14 17  CREATININE 0.77 0.72  CALCIUM 9.3 9.2  MG 1.9  --   AST 46* 48*  ALT 142* 125*  ALKPHOS 279* 272*  BILITOT 4.3* 3.8*    Microbiology Results   No results found for this or any previous visit (from the past 240 hour(s)).  RADIOLOGY:  No results found.   Management plans discussed with the patient, family and they are in agreement.  CODE STATUS:  Code Status History    Date Active Date Inactive Code Status Order ID Comments User Context   04/29/2017 17:29 04/30/2017 19:59 Full Code [id number]  Vaughan Basta, MD Inpatient   04/21/2017 13:22 04/22/2017 15:47 Full Code [id number]  Epifanio Lesches, MD ED      TOTAL TIME TAKING CARE OF THIS PATIENT: 40 minutes.    Fritzi Mandes M.D on 05/03/2017 at  2:02 PM  Between 7am to 6pm - Pager - [phone] After 6pm go to [email or web address] - password [facility]  Office  [phone]  CC: Primary care physician; Maeola Sarah, MD

## 2017-05-04 ENCOUNTER — Inpatient Hospital Stay: Payer: Medicaid Other

## 2017-05-04 ENCOUNTER — Telehealth: Payer: Self-pay | Admitting: Internal Medicine

## 2017-05-04 ENCOUNTER — Other Ambulatory Visit: Payer: Self-pay | Admitting: Oncology

## 2017-05-04 ENCOUNTER — Encounter (HOSPITAL_COMMUNITY): Payer: Self-pay

## 2017-05-04 DIAGNOSIS — Z5111 Encounter for antineoplastic chemotherapy: Secondary | ICD-10-CM | POA: Diagnosis not present

## 2017-05-04 DIAGNOSIS — C251 Malignant neoplasm of body of pancreas: Secondary | ICD-10-CM

## 2017-05-04 DIAGNOSIS — Z95828 Presence of other vascular implants and grafts: Secondary | ICD-10-CM

## 2017-05-04 LAB — CBC WITH DIFFERENTIAL/PLATELET
BASOS PCT: 0 %
Basophils Absolute: 0.1 10*3/uL (ref 0–0.1)
EOS ABS: 0.3 10*3/uL (ref 0–0.7)
Eosinophils Relative: 1 %
HEMATOCRIT: 36.6 % — AB (ref 40.0–52.0)
Hemoglobin: 12.4 g/dL — ABNORMAL LOW (ref 13.0–18.0)
Lymphocytes Relative: 7 %
Lymphs Abs: 2 10*3/uL (ref 1.0–3.6)
MCH: 30.1 pg (ref 26.0–34.0)
MCHC: 33.9 g/dL (ref 32.0–36.0)
MCV: 88.7 fL (ref 80.0–100.0)
MONO ABS: 2 10*3/uL — AB (ref 0.2–1.0)
MONOS PCT: 7 %
NEUTROS ABS: 25.8 10*3/uL — AB (ref 1.4–6.5)
Neutrophils Relative %: 85 %
Platelets: 272 10*3/uL (ref 150–440)
RBC: 4.12 MIL/uL — ABNORMAL LOW (ref 4.40–5.90)
RDW: 14.5 % (ref 11.5–14.5)
WBC: 30.1 10*3/uL — ABNORMAL HIGH (ref 3.8–10.6)

## 2017-05-04 LAB — COMPREHENSIVE METABOLIC PANEL
ALBUMIN: 3.4 g/dL — AB (ref 3.5–5.0)
ALT: 561 U/L — ABNORMAL HIGH (ref 17–63)
ANION GAP: 8 (ref 5–15)
AST: 293 U/L — ABNORMAL HIGH (ref 15–41)
Alkaline Phosphatase: 514 U/L — ABNORMAL HIGH (ref 38–126)
BILIRUBIN TOTAL: 7.9 mg/dL — AB (ref 0.3–1.2)
BUN: 10 mg/dL (ref 6–20)
CO2: 26 mmol/L (ref 22–32)
Calcium: 9.2 mg/dL (ref 8.9–10.3)
Chloride: 99 mmol/L — ABNORMAL LOW (ref 101–111)
Creatinine, Ser: 0.6 mg/dL — ABNORMAL LOW (ref 0.61–1.24)
GFR calc non Af Amer: >60 mL/min (ref >60)
GLUCOSE: 134 mg/dL — AB (ref 65–99)
POTASSIUM: 3.1 mmol/L — AB (ref 3.5–5.1)
Sodium: 133 mmol/L — ABNORMAL LOW (ref 135–145)
TOTAL PROTEIN: 7.3 g/dL (ref 6.5–8.1)

## 2017-05-04 MED ORDER — SODIUM CHLORIDE 0.9% FLUSH
10.0000 mL | INTRAVENOUS | Status: AC | PRN
  Administered 2017-05-04: 10 mL
  Filled 2017-05-04: qty 10

## 2017-05-04 MED ORDER — POTASSIUM CHLORIDE ER 20 MEQ PO TBCR: 10.0000 meq | EXTENDED_RELEASE_TABLET | Freq: Every day | ORAL | 0 refills | Status: DC

## 2017-05-04 MED ORDER — HEPARIN SOD (PORK) LOCK FLUSH 100 UNIT/ML IV SOLN
500.0000 [IU] | INTRAVENOUS | Status: AC | PRN
  Administered 2017-05-04: 500 [IU]

## 2017-05-04 NOTE — Telephone Encounter (Signed)
Spoke to Dr. Allen Norris re: elevated liver enzymes-likely the obstruction of the biliary tract. Clinically stable. Dr.Wohl recommends the pt keep his appointment for tomorrow.   Nathaniel Saunders- Please inform the patient of above; however if he starts throwing up having worsening abdominal pain in the interim-he should go to the emergency room.

## 2017-05-04 NOTE — Telephone Encounter (Signed)
Called and spoke with Nathaniel Saunders. Instructed to keep appointment with Dr. Allen Norris for 12/13. Confirmed time and location of appointment with him. Instructed to come back to cancer center to have port de-accessed. He verbalized understanding that if he has increased abdominal pain, vomiting, to go to the emergency room.

## 2017-05-04 NOTE — Progress Notes (Signed)
Social worker met with patient's fiance, Lindley Magnus Loving to provide counseling services at the cancer center.

## 2017-05-05 ENCOUNTER — Telehealth: Payer: Self-pay

## 2017-05-05 ENCOUNTER — Other Ambulatory Visit: Payer: Self-pay

## 2017-05-05 ENCOUNTER — Ambulatory Visit (INDEPENDENT_AMBULATORY_CARE_PROVIDER_SITE_OTHER): Payer: No Typology Code available for payment source | Admitting: Gastroenterology

## 2017-05-05 ENCOUNTER — Encounter: Payer: Self-pay | Admitting: Gastroenterology

## 2017-05-05 ENCOUNTER — Encounter: Payer: Self-pay | Admitting: *Deleted

## 2017-05-05 VITALS — BP 108/67 | HR 68 | Ht 63.0 in | Wt 127.0 lb

## 2017-05-05 DIAGNOSIS — R748 Abnormal levels of other serum enzymes: Secondary | ICD-10-CM | POA: Diagnosis not present

## 2017-05-05 DIAGNOSIS — C250 Malignant neoplasm of head of pancreas: Secondary | ICD-10-CM | POA: Diagnosis not present

## 2017-05-05 DIAGNOSIS — K8689 Other specified diseases of pancreas: Secondary | ICD-10-CM

## 2017-05-05 NOTE — Progress Notes (Signed)
Primary Care Physician: Maeola Sarah, MD  Primary Gastroenterologist:  Dr. Lucilla Lame  No chief complaint on file.   HPI: Nathaniel Saunders is a 46 y.o. male here for follow-up of abnormal liver enzymes and a pancreatic mass. The patient was seen by oncology last week and was noted to have a elevated white cell count and increased bilirubin after receiving chemotherapy. The patient was admitted to the hospital and his labs quickly improved so he was discharged with outpatient follow-up. The patient was seen by oncology yesterday and his liver enzymes have again increased with his bilirubin being above 7 now. The patient has a 10 [place] 7 cm stent in his bile duct that was placed at the end of November. The patient denies any fevers or chills.  Current Outpatient Medications  Medication Sig Dispense Refill  . albuterol (PROVENTIL HFA;VENTOLIN HFA) 108 (90 Base) MCG/ACT inhaler Inhale 1-2 puffs every 6 (six) hours as needed into the lungs for wheezing or shortness of breath.    . fentaNYL (DURAGESIC - DOSED MCG/HR) 25 MCG/HR patch Place 1 patch (25 mcg total) onto the skin every 3 (three) days. 3 patch 0  . lidocaine-prilocaine (EMLA) cream Apply 1 application topically as needed. Apply generously over the Mediport 45 minutes prior to chemotherapy. (Patient not taking: Reported on 04/29/2017) 30 g 0  . loperamide (IMODIUM) 2 MG capsule Take 1 capsule (2 mg total) by mouth as needed for diarrhea or loose stools. 30 capsule 0  . ondansetron (ZOFRAN) 8 MG tablet One pill every 8 hours as needed for nausea/vomitting. (Patient not taking: Reported on 04/29/2017) 40 tablet 1  . oxyCODONE-acetaminophen (PERCOCET/ROXICET) 5-325 MG tablet Take 1 tablet by mouth every 6 (six) hours as needed. 30 tablet 0  . potassium chloride 20 MEQ TBCR Take 10 mEq by mouth daily for 14 days. 14 tablet 0  . prochlorperazine (COMPAZINE) 10 MG tablet Take 1 tablet (10 mg total) by mouth every 6 (six) hours as needed for  nausea or vomiting. 30 tablet 0   No current facility-administered medications for this visit.     Allergies as of 05/05/2017  . (No Known Allergies)    ROS:  General: Negative for anorexia, weight loss, fever, chills, fatigue, weakness. ENT: Negative for hoarseness, difficulty swallowing , nasal congestion. CV: Negative for chest pain, angina, palpitations, dyspnea on exertion, peripheral edema.  Respiratory: Negative for dyspnea at rest, dyspnea on exertion, cough, sputum, wheezing.  GI: See history of present illness. GU:  Negative for dysuria, hematuria, urinary incontinence, urinary frequency, nocturnal urination.  Endo: Negative for unusual weight change.    Physical Examination:   There were no vitals taken for this visit.  General: Well-nourished, well-developed in no acute distress.  Eyes: Positive icterus. Conjunctivae yellow. Mouth: Oropharyngeal mucosa moist and pink , no lesions erythema or exudate. Extremities: No lower extremity edema. No clubbing or deformities. Neuro: Alert and oriented x 3.  Grossly intact. Skin: Warm and dry, positive jaundice.   Psych: Alert and cooperative, normal mood and affect.  Labs:    Imaging Studies: Ct Chest W Contrast  Result Date: 04/15/2017 CLINICAL DATA:  Staging evaluation. Newly diagnosed pancreatic cancer. EXAM: CT CHEST WITH CONTRAST TECHNIQUE: Multidetector CT imaging of the chest was performed during intravenous contrast administration. CONTRAST:  71mL ISOVUE-300 IOPAMIDOL (ISOVUE-300) INJECTION 61% COMPARISON:  CT abdomen pelvis 04/01/2017; CT head of pelvis 03/23/2017. FINDINGS: Cardiovascular: Normal heart size. No pericardial effusion. Coronary artery vascular calcifications. Aorta main pulmonary artery normal  in caliber. Mediastinum/Nodes: Small hiatal hernia. No enlarged axillary, mediastinal or hilar lymphadenopathy. Lungs/Pleura: Central airways are patent. Bilateral regions of architectural distortion,  bronchiectasis and linear opacities are demonstrated compatible with pulmonary parenchymal scarring. There is a 4 mm left lower lobe nodule (image 86; series 3). No pleural effusion or pneumothorax. Upper Abdomen: Incompletely visualized biliary ductal and pancreatic ductal dilatation. Incompletely visualized pancreatic masses. No acute process. Musculoskeletal: Thoracic spine degenerative changes. No aggressive or acute appearing osseous lesions. IMPRESSION: 1. Bilateral pulmonary parenchymal findings most compatible with scarring and fibrosis. Possibility of interstitial lung disease is not excluded. Consider short-term follow-up high-resolution chest CT. 2. No definite evidence of pulmonary metastatic disease although evaluation is somewhat limited secondary to extensive parenchymal scarring and fibrosis. 3. Emphysema (ICD10-J43.9). Electronically Signed   By: Lovey Newcomer M.D.   On: 04/15/2017 14:06   Ct Abdomen Pelvis W Contrast  Result Date: 04/30/2017 CLINICAL DATA:  Recent diagnosis of pancreatic cancer. The patient received the first round of chemotherapy. On lab work, LFTs and bilirubin were elevated. EXAM: CT ABDOMEN AND PELVIS WITH CONTRAST TECHNIQUE: Multidetector CT imaging of the abdomen and pelvis was performed using the standard protocol following bolus administration of intravenous contrast. CONTRAST:  173mL ISOVUE-300 IOPAMIDOL (ISOVUE-300) INJECTION 61% COMPARISON:  April 01, 2017 and March 23, 2017 FINDINGS: Lower chest: Scarring is seen in the left base and anterior right base, unchanged. No metastatic lesions or evidence of pneumonia. The lung bases are otherwise unremarkable. Hepatobiliary: A biliary stent has been placed in the interval extending from central hepatic duct, through the common bile duct, into the region of the inferior pancreatic head near the junction with the duodenum. There is significant dilatation of the intra and extrahepatic bile ducts which is actually increased  since March 23, 2017 despite the stent. The common bile duct on image 25 measures at least 14 mm. The gallbladder is mildly distended. Air in the intrahepatic bile ducts is consistent with a stent. The portal vein remains patent within the porta hepatis and liver. It is narrowed by the known malignancy as seen on series 2, image 24, similar in the interval. No liver masses are seen. Pancreas: The patient's known pancreatic malignancy is again identified in the neck measuring 3.1 by 2.2 cm on today's study. The main portal vein is narrowed as above. Neither the celiac or SMA trunks are encased. The SMV it appears remain patent as well. There is pancreatic dilatation more distally in the pancreas. There is likely dilatation of a pancreatic side duct on image 19. Spleen: Normal in size without focal abnormality. Adrenals/Urinary Tract: The adrenal glands are normal. There is a probable right renal cyst too small to characterize. The kidneys are otherwise normal as is the bladder. No ureteral stones identified. Stomach/Bowel: The stomach and small bowel are unremarkable with no obstruction. The colon and visualized appendix are normal. Vascular/Lymphatic: Mild atherosclerotic changes in the abdominal aorta. No discrete adenopathy identified. Reproductive: Prostate is unremarkable. Other: No abdominal wall hernia or abnormality. No abdominopelvic ascites. Musculoskeletal: No acute or significant osseous findings. IMPRESSION: 1. The biliary stent extends from a hepatic branch of the biliary tree, through the common bile duct, into the pancreatic head. It does not appear that the stent actually exits the pancreatic head on today's study but it is close to the junction of the pancreatic head and duodenum. Despite the stent, there is increasing intra and extrahepatic biliary duct dilatation which explains the patient's symptoms. 2. Changes of pancreatic carcinoma as  described above. The carcinoma narrows the main portal  vein. 3. Mild atherosclerotic change in the abdominal aorta. Electronically Signed   By: Dorise Bullion III M.D   On: 04/30/2017 01:33   Dg C-arm 1-60 Min-no Report  Result Date: 04/21/2017 Fluoroscopy was utilized by the requesting physician.  No radiographic interpretation.    Assessment and Plan:   Darwyn Deon Krasner is a 46 y.o. y/o male who comes in today with a history of a pancreatic cancer and elevated liver enzymes despite having a stent placed 15 days ago. The patient's bilirubin and LFTs including alkaline phosphatase have dramatically increased. The patient will be set up for an ERCP for tomorrow with the hope of ordering a covered metal biliary stent to be shipped overnight so that he can have it placed tomorrow. The patient has been explained the plan and agrees with it.    Lucilla Lame, MD. Marval Regal   Note: This dictation was prepared with Dragon dictation along with smaller phrase technology. Any transcriptional errors that result from this process are unintentional.

## 2017-05-05 NOTE — Telephone Encounter (Signed)
Returned call left on voicemail after hours to [employer]. They did call on call physician last night related to dark urine. We reviewed yesterday's labs again and stressed importance of keeping appointment with Dr. Allen Norris today. He will likely need ERCP with stent exchange. He is feeling good today, has an appetite and is eating. Denies any vomiting or abdominal pain. Oncology Nurse Navigator Documentation  Navigator Location: [facility] (05/05/17 0800)   )Navigator Encounter Type: Telephone (05/05/17 0800) Telephone: Lahoma Crocker Call (05/05/17 0800)                                                  Time Spent with Patient: 15 (05/05/17 0800)

## 2017-05-06 ENCOUNTER — Telehealth: Payer: Self-pay | Admitting: *Deleted

## 2017-05-06 ENCOUNTER — Emergency Department: Payer: Medicaid Other

## 2017-05-06 ENCOUNTER — Ambulatory Visit
Admission: RE | Admit: 2017-05-06 | Discharge: 2017-05-06 | Disposition: A | Discharge status: Home / Self Care | Payer: Medicaid Other | Source: Ambulatory Visit | Attending: Gastroenterology | Admitting: Gastroenterology

## 2017-05-06 ENCOUNTER — Ambulatory Visit: Payer: Medicaid Other

## 2017-05-06 ENCOUNTER — Ambulatory Visit: Payer: Medicaid Other | Admitting: Certified Registered Nurse Anesthetist

## 2017-05-06 ENCOUNTER — Encounter
Admission: RE | Disposition: A | Discharge status: Home / Self Care | Payer: Self-pay | Source: Ambulatory Visit | Attending: Gastroenterology

## 2017-05-06 ENCOUNTER — Emergency Department
Admission: EM | Admit: 2017-05-06 | Discharge: 2017-05-07 | Disposition: A | Discharge status: Home / Self Care | Payer: Medicaid Other | Source: Home / Self Care | Attending: Emergency Medicine | Admitting: Emergency Medicine

## 2017-05-06 ENCOUNTER — Encounter: Payer: Self-pay | Admitting: *Deleted

## 2017-05-06 ENCOUNTER — Encounter: Payer: Self-pay | Admitting: Emergency Medicine

## 2017-05-06 DIAGNOSIS — R112 Nausea with vomiting, unspecified: Secondary | ICD-10-CM

## 2017-05-06 DIAGNOSIS — C250 Malignant neoplasm of head of pancreas: Secondary | ICD-10-CM

## 2017-05-06 DIAGNOSIS — K869 Disease of pancreas, unspecified: Secondary | ICD-10-CM | POA: Diagnosis not present

## 2017-05-06 DIAGNOSIS — Z800 Family history of malignant neoplasm of digestive organs: Secondary | ICD-10-CM | POA: Insufficient documentation

## 2017-05-06 DIAGNOSIS — R1084 Generalized abdominal pain: Secondary | ICD-10-CM | POA: Insufficient documentation

## 2017-05-06 DIAGNOSIS — Z79899 Other long term (current) drug therapy: Secondary | ICD-10-CM | POA: Insufficient documentation

## 2017-05-06 DIAGNOSIS — K8689 Other specified diseases of pancreas: Secondary | ICD-10-CM

## 2017-05-06 DIAGNOSIS — Z4659 Encounter for fitting and adjustment of other gastrointestinal appliance and device: Secondary | ICD-10-CM | POA: Insufficient documentation

## 2017-05-06 DIAGNOSIS — Z87891 Personal history of nicotine dependence: Secondary | ICD-10-CM

## 2017-05-06 DIAGNOSIS — K831 Obstruction of bile duct: Secondary | ICD-10-CM | POA: Diagnosis not present

## 2017-05-06 DIAGNOSIS — J45909 Unspecified asthma, uncomplicated: Secondary | ICD-10-CM | POA: Diagnosis not present

## 2017-05-06 HISTORY — PX: ERCP: SHX5425

## 2017-05-06 LAB — URINALYSIS, COMPLETE (UACMP) WITH MICROSCOPIC
BACTERIA UA: NONE SEEN
Bilirubin Urine: NEGATIVE
Glucose, UA: NEGATIVE mg/dL
Ketones, ur: 20 mg/dL — AB
Leukocytes, UA: NEGATIVE
NITRITE: NEGATIVE
PROTEIN: NEGATIVE mg/dL
SQUAMOUS EPITHELIAL / LPF: NONE SEEN
pH: 6 (ref 5.0–8.0)

## 2017-05-06 LAB — INFLUENZA PANEL BY PCR (TYPE A & B)
INFLAPCR: NEGATIVE
INFLBPCR: NEGATIVE

## 2017-05-06 LAB — COMPREHENSIVE METABOLIC PANEL
ALK PHOS: 546 U/L — AB (ref 38–126)
ALT: 547 U/L — AB (ref 17–63)
AST: 256 U/L — ABNORMAL HIGH (ref 15–41)
Albumin: 3.1 g/dL — ABNORMAL LOW (ref 3.5–5.0)
Anion gap: 11 (ref 5–15)
BUN: 10 mg/dL (ref 6–20)
CALCIUM: 8.9 mg/dL (ref 8.9–10.3)
CHLORIDE: 101 mmol/L (ref 101–111)
CO2: 24 mmol/L (ref 22–32)
CREATININE: 0.58 mg/dL — AB (ref 0.61–1.24)
GFR calc non Af Amer: >60 mL/min (ref >60)
Glucose, Bld: 102 mg/dL — ABNORMAL HIGH (ref 65–99)
Potassium: 3.4 mmol/L — ABNORMAL LOW (ref 3.5–5.1)
SODIUM: 136 mmol/L (ref 135–145)
Total Bilirubin: 5.5 mg/dL — ABNORMAL HIGH (ref 0.3–1.2)
Total Protein: 6.8 g/dL (ref 6.5–8.1)

## 2017-05-06 LAB — CBC
HCT: 35.4 % — ABNORMAL LOW (ref 40.0–52.0)
HEMOGLOBIN: 12.6 g/dL — AB (ref 13.0–18.0)
MCH: 31.2 pg (ref 26.0–34.0)
MCHC: 35.6 g/dL (ref 32.0–36.0)
MCV: 87.6 fL (ref 80.0–100.0)
PLATELETS: 246 10*3/uL (ref 150–440)
RBC: 4.04 MIL/uL — AB (ref 4.40–5.90)
RDW: 15.4 % — ABNORMAL HIGH (ref 11.5–14.5)
WBC: 28.4 10*3/uL — ABNORMAL HIGH (ref 3.8–10.6)

## 2017-05-06 SURGERY — ERCP, WITH INTERVENTION IF INDICATED
Anesthesia: General

## 2017-05-06 MED ORDER — ONDANSETRON 4 MG PO TBDP: 4.0000 mg | ORAL_TABLET | Freq: Three times a day (TID) | ORAL | 0 refills | Status: DC | PRN

## 2017-05-06 MED ORDER — SODIUM CHLORIDE 0.9 % IV BOLUS (SEPSIS)
1000.0000 mL | Freq: Once | INTRAVENOUS | Status: AC
  Administered 2017-05-06: 1000 mL via INTRAVENOUS

## 2017-05-06 MED ORDER — SODIUM CHLORIDE 0.9 % IV SOLN
INTRAVENOUS | Status: DC
  Administered 2017-05-06: 11:00:00 via INTRAVENOUS

## 2017-05-06 MED ORDER — PROMETHAZINE HCL 12.5 MG PO TABS: 12.5000 mg | ORAL_TABLET | Freq: Three times a day (TID) | ORAL | 0 refills | Status: DC | PRN

## 2017-05-06 MED ORDER — FENTANYL CITRATE (PF) 100 MCG/2ML IJ SOLN
INTRAMUSCULAR | Status: DC | PRN
  Administered 2017-05-06: 50 ug via INTRAVENOUS

## 2017-05-06 MED ORDER — LIDOCAINE HCL (PF) 2 % IJ SOLN
INTRAMUSCULAR | Status: AC
  Filled 2017-05-06: qty 10

## 2017-05-06 MED ORDER — FENTANYL 25 MCG/HR TD PT72: 25.0000 ug | MEDICATED_PATCH | TRANSDERMAL | 0 refills | Status: DC

## 2017-05-06 MED ORDER — HYDROMORPHONE HCL 1 MG/ML IJ SOLN
1.0000 mg | Freq: Once | INTRAMUSCULAR | Status: AC
  Administered 2017-05-06: 1 mg via INTRAVENOUS
  Filled 2017-05-06: qty 1

## 2017-05-06 MED ORDER — FENTANYL CITRATE (PF) 100 MCG/2ML IJ SOLN
INTRAMUSCULAR | Status: AC
  Filled 2017-05-06: qty 2

## 2017-05-06 MED ORDER — PROPOFOL 10 MG/ML IV BOLUS
INTRAVENOUS | Status: DC | PRN
  Administered 2017-05-06: 50 mg via INTRAVENOUS
  Administered 2017-05-06: 30 mg via INTRAVENOUS
  Administered 2017-05-06: 20 mg via INTRAVENOUS

## 2017-05-06 MED ORDER — PROMETHAZINE HCL 25 MG RE SUPP
25.0000 mg | Freq: Three times a day (TID) | RECTAL | 0 refills | Status: DC | PRN
Start: 2017-05-06 — End: 2017-08-01

## 2017-05-06 MED ORDER — ONDANSETRON HCL 4 MG/2ML IJ SOLN
4.0000 mg | Freq: Once | INTRAMUSCULAR | Status: AC
  Administered 2017-05-06: 4 mg via INTRAVENOUS
  Filled 2017-05-06: qty 2

## 2017-05-06 MED ORDER — PROPOFOL 10 MG/ML IV BOLUS
INTRAVENOUS | Status: AC
  Filled 2017-05-06: qty 20

## 2017-05-06 MED ORDER — IOPAMIDOL (ISOVUE-300) INJECTION 61%
75.0000 mL | Freq: Once | INTRAVENOUS | Status: AC | PRN
  Administered 2017-05-06: 75 mL via INTRAVENOUS

## 2017-05-06 MED ORDER — PROPOFOL 500 MG/50ML IV EMUL
INTRAVENOUS | Status: DC | PRN
  Administered 2017-05-06: 140 ug/kg/min via INTRAVENOUS

## 2017-05-06 MED ORDER — LIDOCAINE HCL (CARDIAC) 20 MG/ML IV SOLN
INTRAVENOUS | Status: DC | PRN
  Administered 2017-05-06: 50 mg via INTRAVENOUS

## 2017-05-06 NOTE — Interval H&P Note (Signed)
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 After consideration of risks, benefits and other options for treatment, the patient has consented to  Procedure(s): ENDOSCOPIC RETROGRADE CHOLANGIOPANCREATOGRAPHY (ERCP) (N/A) as a surgical intervention .  The patient's history has been reviewed, patient examined, no change in status, stable for surgery.  I have reviewed the patient's chart and labs.  Questions were answered to the patient's satisfaction.     Katurah Karapetian [employer]

## 2017-05-06 NOTE — Telephone Encounter (Signed)
Pharmacy called requesting to increase the number of patches to 5 so that they could supply patient with a stock box which will save on dispensing fees.

## 2017-05-06 NOTE — Telephone Encounter (Signed)
Patient called requesting refill for Duragesic Patch. Refilled per Dr. Noemi Chapel.

## 2017-05-06 NOTE — Anesthesia Postprocedure Evaluation (Signed)
Anesthesia Post Note  Patient: Breyton Deon Leblond  Procedure(s) Performed: ENDOSCOPIC RETROGRADE CHOLANGIOPANCREATOGRAPHY (ERCP) (N/A )  Patient location during evaluation: Endoscopy Anesthesia Type: General Level of consciousness: awake and alert and oriented Pain management: pain level controlled Vital Signs Assessment: post-procedure vital signs reviewed and stable Respiratory status: spontaneous breathing, nonlabored ventilation and respiratory function stable Cardiovascular status: blood pressure returned to baseline and stable Postop Assessment: no signs of nausea or vomiting Anesthetic complications: no     Last Vitals:  Vitals:   05/06/17 1259 05/06/17 1309  BP: 119/79 137/87  Pulse: 67 (!) 58  Resp: (!) 26 20  Temp:    SpO2: 100% 100%    Last Pain:  Vitals:   05/06/17 1249  TempSrc: Tympanic  PainSc: Asleep                 Yamel Bale

## 2017-05-06 NOTE — Transfer of Care (Signed)
Immediate Anesthesia Transfer of Care Note  Patient: Nathaniel Saunders  Procedure(s) Performed: ENDOSCOPIC RETROGRADE CHOLANGIOPANCREATOGRAPHY (ERCP) (N/A )  Patient Location: PACU  Anesthesia Type:General  Level of Consciousness: sedated  Airway & Oxygen Therapy: Patient Spontanous Breathing and Patient connected to nasal cannula oxygen  Post-op Assessment: Report given to RN and Post -op Vital signs reviewed and stable  Post vital signs: Reviewed and stable  Last Vitals:  Vitals:   05/06/17 1100  BP: 119/81  Pulse: 75  Resp: 16  Temp: (!) 35.6 C    Last Pain:  Vitals:   05/06/17 1100  TempSrc: Tympanic  PainSc: 8          Complications: No apparent anesthesia complications

## 2017-05-06 NOTE — ED Notes (Signed)
Per Mariea Clonts, only order at this time is CT without IV contrast.

## 2017-05-06 NOTE — ED Triage Notes (Signed)
First Nurse Note:  Arrives with c/o abdominal pain.  Patinet has history of pancreatic cancer and had biliary stent placed today.

## 2017-05-06 NOTE — Op Note (Signed)
[facility] Gastroenterology Patient Name: Nathaniel Saunders Procedure Date: 05/06/2017 11:59 AM MRN: [id number] Account #: [id number] Date of Birth: 1971/03/07 Admit Type: Outpatient Age: 46 Room: [facility] ENDO ROOM 4 Gender: Male Note Status: Finalized Procedure:            ERCP Indications:          Malignant tumor of the head of pancreas Providers:            Lucilla Lame MD, MD Referring MD:         Maeola Sarah, MD (Referring MD) Medicines:            Propofol per Anesthesia Complications:        No immediate complications. Procedure:            Pre-Anesthesia Assessment:                       - Prior to the procedure, a History and Physical was                        performed, and patient medications and allergies were                        reviewed. The patient's tolerance of previous                        anesthesia was also reviewed. The risks and benefits of                        the procedure and the sedation options and risks were                        discussed with the patient. All questions were                        answered, and informed consent was obtained. Prior                        Anticoagulants: The patient has taken no previous                        anticoagulant or antiplatelet agents. ASA Grade                        Assessment: II - A patient with mild systemic disease.                        After reviewing the risks and benefits, the patient was                        deemed in satisfactory condition to undergo the                        procedure.                       After obtaining informed consent, the scope was passed                        under direct vision. Throughout the procedure, the  patient's blood pressure, pulse, and oxygen saturations                        were monitored continuously. The Endoscope was                        introduced through the mouth, and used to inject     contrast into and used to inject contrast into the bile                        duct. The ERCP was accomplished without difficulty. The                        patient tolerated the procedure well. Findings:      A biliary stent was visible on the scout film. The esophagus was       successfully intubated under direct vision. The scope was advanced to a       normal major papilla in the descending duodenum without detailed       examination of the pharynx, larynx and associated structures, and upper       GI tract. The upper GI tract was grossly normal. , One plastic stent       originating in the biliary tree was emerging from the major papilla. One       stent was removed from the biliary tree using a snare. The bile duct was       deeply cannulated with the short-nosed traction sphincterotome. Contrast       was injected. I personally interpreted the bile duct images. There was       brisk flow of contrast through the ducts. Image quality was excellent.       Contrast extended to the hepatic ducts. The lower third of the main bile       duct contained a single segmental stenosis. The biliary tree was swept       with a 15 mm balloon starting at the bifurcation. Sludge was swept from       the duct. One 10 Fr by 6 cm metal stent with no external flaps and no       internal flaps was placed 5 cm into the common bile duct. Bile flowed       through the stent. The stent was in good position. Impression:           - One stent from the biliary tree was seen in the major                        papilla.                       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 The                        stricture was malignant appearing.                       - One stent was removed from the biliary tree.                       - The biliary tree was swept and sludge was found.                       -  One covered metal stent was placed into the common                        bile duct. Recommendation:        - Watch for pancreatitis, bleeding, perforation, and                        cholangitis. Procedure Code(s):    --- Professional ---                       [phone], Endoscopic retrograde cholangiopancreatography                        (ERCP); with removal and exchange of stent(s), biliary                        or pancreatic duct, including pre- and post-dilation                        and guide wire passage, when performed, including                        sphincterotomy, when performed, each stent exchanged                       [id number], Endoscopic retrograde cholangiopancreatography                        (ERCP); with removal of calculi/debris from                        biliary/pancreatic duct(s)                       09323, Endoscopic catheterization of the biliary ductal                        system, radiological supervision and interpretation Diagnosis Code(s):    --- Professional ---                       C25.0, Malignant neoplasm of head of pancreas                       K83.1, Obstruction of bile duct                       Z46.59, Encounter for fitting and adjustment of other                        gastrointestinal appliance and device CPT copyright 2016 American Medical Association. All rights reserved. The codes documented in this report are preliminary and upon coder review may  be revised to meet current compliance requirements. Lucilla Lame MD, MD 05/06/2017 12:45:29 PM This report has been signed electronically. Number of Addenda: 0 Note Initiated On: 05/06/2017 11:59 AM      [facility]

## 2017-05-06 NOTE — ED Notes (Signed)
Patient given ginger ale and graham crackers, peanut butter

## 2017-05-06 NOTE — Anesthesia Preprocedure Evaluation (Signed)
Anesthesia Evaluation  Patient identified by MRN, date of birth, ID band Patient awake    Reviewed: Allergy & Precautions, NPO status , Patient's Chart, lab work & pertinent test results  History of Anesthesia Complications Negative for: history of anesthetic complications  Airway Mallampati: II  TM Distance: >3 FB Neck ROM: Full    Dental  (+) Poor Dentition, Loose, Missing   Pulmonary asthma , former smoker,    breath sounds clear to auscultation- rhonchi (-) wheezing      Cardiovascular Exercise Tolerance: Good (-) hypertension(-) CAD, (-) Past MI, (-) Cardiac Stents and (-) CABG  Rhythm:Regular Rate:Normal - Systolic murmurs and - Diastolic murmurs    Neuro/Psych negative neurological ROS  negative psych ROS   GI/Hepatic negative GI ROS, Neg liver ROS,   Endo/Other  negative endocrine ROSneg diabetes  Renal/GU negative Renal ROS     Musculoskeletal negative musculoskeletal ROS (+)   Abdominal (+) - obese,   Peds  Hematology negative hematology ROS (+)   Anesthesia Other Findings Past Medical History: No date: Asthma No date: Pancreatic mass No date: Pancreatic mass   Reproductive/Obstetrics                             Anesthesia Physical Anesthesia Plan  ASA: III  Anesthesia Plan: General   Post-op Pain Management:    Induction: Intravenous  PONV Risk Score and Plan: 1 and Propofol infusion  Airway Management Planned: Natural Airway  Additional Equipment:   Intra-op Plan:   Post-operative Plan:   Informed Consent: I have reviewed the patients History and Physical, chart, labs and discussed the procedure including the risks, benefits and alternatives for the proposed anesthesia with the patient or authorized representative who has indicated his/her understanding and acceptance.   Dental advisory given  Plan Discussed with: CRNA and Anesthesiologist  Anesthesia  Plan Comments:         Anesthesia Quick Evaluation

## 2017-05-06 NOTE — Anesthesia Post-op Follow-up Note (Signed)
Anesthesia QCDR form completed.        

## 2017-05-06 NOTE — ED Provider Notes (Signed)
[facility] Emergency Department Provider Note  ____________________________________________  Time seen: Approximately 8:43 PM  I have reviewed the triage vital signs and the nursing notes.   HISTORY  Chief Complaint Abdominal Pain    HPI Nathaniel Saunders is a 46 y.o. male with pancreatic cancer on chemo, pancreatic stent change today presenting with abdominal pain, nausea and vomiting.  The patient reports that since he was discharged from the hospital 12/8 after ERCP and initial stent placement, he has had severe diffuse abdominal pain.  He occasionally tries 2.5 mg of oxycodone for his pain, which does not help.  Today, the patient underwent stent exchange, and upon arriving home he developed a worsening abdominal pain that is sharp "like being stabbed" associate with nausea and vomiting.  He was not able to keep down any antiemetic or pain medication.  And he denies any fever or chills, constipation or diarrhea.  He is passing gas.  Past Medical History:  Diagnosis Date  . Asthma   . Pancreatic mass   . Pancreatic mass     Patient Active Problem List   Diagnosis Date Noted  . Pancreatic mass   . Obstruction of bile duct   . Port-A-Cath in place 04/29/2017  . Biliary obstruction 04/29/2017  . Obstructive jaundice due to malignant neoplasm ([place]) 04/21/2017  . Malignant neoplasm of pancreas ([place])   . Extrahepatic obstructive biliary disease   . Malignant tumor of body of pancreas ([place]) 03/24/2017    Past Surgical History:  Procedure Laterality Date  . ERCP N/A 04/21/2017   Procedure: ENDOSCOPIC RETROGRADE CHOLANGIOPANCREATOGRAPHY (ERCP);  Surgeon: Lucilla Lame, MD;  Location: [facility] ENDOSCOPY;  Service: Endoscopy;  Laterality: N/A;  . PORTA CATH INSERTION N/A 04/18/2017   Procedure: PORTA CATH INSERTION;  Surgeon: Algernon Huxley, MD;  Location: [facility] LAB;  Service: Cardiovascular;  Laterality: N/A;    Current Outpatient Rx  . Order #:  [id number] Class: Historical Med  . Order #: [id number] Class: Print  . Order #: [id number] Class: Print  . Order #: [id number] Class: Normal  . Order #: [id number] Class: Normal  . Order #: [id number] Class: Print  . Order #: [id number] Class: Normal  . Order #: [id number] Class: Normal  . Order #: [id number] Class: Normal  . Order #: [id number] Class: Print  . Order #: [id number] Class: Print    Allergies Patient has no known allergies.  Family History  Problem Relation Age of Onset  . Pancreatic cancer Mother 58  . Pancreatic cancer Maternal Grandmother 3    Social History Social History   Tobacco Use  . Smoking status: Former Smoker    Packs/day: 0.25    Years: 5.00    Pack years: 1.25    Types: Cigarettes    Last attempt to quit: 05/07/2015    Years since quitting: 2.0  . Smokeless tobacco: Never Used  Substance Use Topics  . Alcohol use: No  . Drug use: No    Review of Systems Constitutional: No fever/chills.  No lightheadedness or syncope.   Eyes: No visual changes.  Positive scleral icterus. ENT: No sore throat. No congestion or rhinorrhea. Cardiovascular: Denies chest pain. Denies palpitations. Respiratory: Denies shortness of breath.  No cough. Gastrointestinal: Positive diffuse abdominal pain.  Positive nausea, positive vomiting.  No diarrhea.  No constipation.  Positive passing flatus. Genitourinary: Negative for dysuria. Musculoskeletal: Negative for back pain. Skin: Negative for rash. Neurological: Negative for headaches. No focal numbness, tingling or weakness.     ____________________________________________   PHYSICAL EXAM:  VITAL SIGNS:  ED Triage Vitals  Enc Vitals Group     BP 05/06/17 1908 112/77     Pulse Rate 05/06/17 1908 76     Resp 05/06/17 1908 16     Temp 05/06/17 1908 98.2 F (36.8 C)     Temp Source 05/06/17 1908 Oral     SpO2 05/06/17 1908 99 %     Weight 05/06/17 1909 127 lb (57.6 kg)     Height --      Head Circumference --      Peak  Flow --      Pain Score --      Pain Loc --      Pain Edu? --      Excl. in [place]? --     Constitutional: Alert and oriented.  Cachectic and chronically ill appearing but in no acute distress. Answers questions appropriately. Eyes: Conjunctivae are normal.  EOMI. positive scleral icterus. Head: Atraumatic. Nose: No congestion/rhinnorhea. Mouth/Throat: Mucous membranes are moist.  Neck: No stridor.  Supple.   Cardiovascular: Normal rate, regular rhythm. No murmurs, rubs or gallops.  Respiratory: Normal respiratory effort.  No accessory muscle use or retractions. Lungs CTAB.  No wheezes, rales or ronchi. Gastrointestinal: Soft, and nondistended.  Diffuse tenderness to palpation without focality.  No guarding or rebound.  No peritoneal signs. Musculoskeletal: No LE edema.  Neurologic:  A&Ox3.  Speech is clear.  Face and smile are symmetric.  EOMI.  Moves all extremities well. Skin:  Skin is warm, dry and intact. No rash noted. Psychiatric: Mood and affect are normal. Speech and behavior are normal.  Normal judgement.  ____________________________________________   LABS (all labs ordered are listed, but only abnormal results are displayed)  Labs Reviewed  CBC - Abnormal; Notable for the following components:      Result Value   WBC 28.4 (*)    RBC 4.04 (*)    Hemoglobin 12.6 (*)    HCT 35.4 (*)    RDW 15.4 (*)    All other components within normal limits  COMPREHENSIVE METABOLIC PANEL - Abnormal; Notable for the following components:   Potassium 3.4 (*)    Glucose, Bld 102 (*)    Creatinine, Ser 0.58 (*)    Albumin 3.1 (*)    AST 256 (*)    ALT 547 (*)    Alkaline Phosphatase 546 (*)    Total Bilirubin 5.5 (*)    All other components within normal limits  URINALYSIS, COMPLETE (UACMP) WITH MICROSCOPIC - Abnormal; Notable for the following components:   Color, Urine AMBER (*)    APPearance CLEAR (*)    Specific Gravity, Urine >1.046 (*)    Hgb urine dipstick SMALL (*)     Ketones, ur 20 (*)    All other components within normal limits  INFLUENZA PANEL BY PCR (TYPE A & B)   ____________________________________________  EKG  Not indicated ____________________________________________  RADIOLOGY  Ct Abdomen Pelvis W Contrast  Result Date: 05/06/2017 CLINICAL DATA:  Pancreatic cancer. Stent placement today. Lower abdominal pain, vomiting. EXAM: CT ABDOMEN AND PELVIS WITH CONTRAST TECHNIQUE: Multidetector CT imaging of the abdomen and pelvis was performed using the standard protocol following bolus administration of intravenous contrast. CONTRAST:  61mL ISOVUE-300 IOPAMIDOL (ISOVUE-300) INJECTION 61% COMPARISON:  CT 04/29/2017 FINDINGS: Lower chest: Scarring in the left base. No effusions. Heart is normal size. Hepatobiliary: Pneumobilia noted within the central biliary ducts and gallbladder. Metallic biliary stent noted in place. Decreasing biliary ductal dilatation. No focal hepatic abnormality. Pancreas:  Pancreatic mass in the pancreatic neck region is again noted, unchanged. Pancreatic ductal dilatation. Spleen: No focal abnormality.  Normal size. Adrenals/Urinary Tract: Cyst in the midpole of the right kidney. No adrenal abnormality. No focal renal abnormality. No stones or hydronephrosis. Urinary bladder is unremarkable. Stomach/Bowel: Stomach, large and small bowel grossly unremarkable. Vascular/Lymphatic: No evidence of aneurysm or adenopathy. Scattered aortic calcifications. Reproductive: No visible focal abnormality. Other: No free fluid or free air. Musculoskeletal: No acute bony abnormality. IMPRESSION: Pancreatic mass again noted compatible with patient's known history of pancreatic malignancy. Interval exchange of plastic biliary stent for a metallic stent. Decreasing biliary ductal dilatation. Mild pneumobilia. Stable pancreatic ductal dilatation. Stable scarring in the left lung base. Electronically Signed   By: Rolm Baptise M.D.   On: 05/06/2017 20:58    Dg C-arm 1-60 Min-no Report  Result Date: 05/06/2017 Fluoroscopy was utilized by the requesting physician.  No radiographic interpretation.    ____________________________________________   PROCEDURES  Procedure(s) performed: None  Procedures  Critical Care performed: No ____________________________________________   INITIAL IMPRESSION / ASSESSMENT AND PLAN / ED COURSE  Pertinent labs & imaging results that were available during my care of the patient were reviewed by me and considered in my medical decision making (see chart for details).  46 y.o. w/ pancreatic cancer and chemo, status post pancreatic stent replacement today, presenting with abdominal pain that is worsening, associated now with nausea and vomiting.  Overall, the patient is hemodynamically stable and afebrile.  His abdomen is nondistended and does not have any focal areas of tenderness, but given his recent instrumentation, will get a CT scan to evaluate for any complication including perforation, stent displacement.  Obstruction is much less likely given that he is still passing gas.  He could also be having pain from his pancreatic mass.  Plan reevaluation for final disposition.  Symptom medic treatment has been initiated.  I reviewed the patient's medical chart  ----------------------------------------- 10:59 PM on 05/06/2017 -----------------------------------------  Patient's workup in the emergency department has been reassuring.  He does have an elevated white blood cell count, but this is within his baseline over the last several days.  He also has elevated LFTs, which are also within baseline or improved compared to baseline.  His urinalysis does not show UTI.  His CT scan does not show any acute intra-abdominal process that would be worrisome for infection or requiring surgical intervention or admission.  Clinically, the patient has remained afebrile and hemodynamically stable.  His pain has  significant improved and his nausea has resolved.  He has been able to tolerate liquid p.o. without any difficulty.  I will plan to discharge the patient home with instructions for clear liquid diet for the next 2 days, then advancing to a bland diet.  He will be discharged with 3 separate forms of antiemetics to help with his nausea and vomiting.  I have asked him to make a follow-up appointment with his oncologist, and he understands return precautions.  At this time the patient is stable for discharge.  ____________________________________________  FINAL CLINICAL IMPRESSION(S) / ED DIAGNOSES  Final diagnoses:  Generalized abdominal pain  Non-intractable vomiting with nausea, unspecified vomiting type         NEW MEDICATIONS STARTED DURING THIS VISIT:  This SmartLink is deprecated. Use AVSMEDLIST instead to display the medication list for a patient.    Eula Listen, MD 05/06/17 2300

## 2017-05-06 NOTE — ED Triage Notes (Signed)
Pt reports he had a stent placed in pancreas today due to pancreatic cancer and since has had lower abdominal pain 9/10 as well as 3 episodes of emesis. Pt denies nausea currently but is having the 9/10 abdominal pain. Pt has taken oxycodone as well as wearing his fentanyl patch, without relief.

## 2017-05-06 NOTE — ED Notes (Signed)
Correction:  IV contrast only per Dr. Mariea Clonts

## 2017-05-06 NOTE — Discharge Instructions (Signed)
You may use Zofran or Phenergan for nausea and vomiting.  Please make sure you drink plenty of fluid.  Please take a clear liquid diet for the next 2 days, then advance to bland diet as tolerated.  You may take your pain medication, but if it causes constipation, please take an over-the-counter stool softener with it.  Return to the emergency department if you develop severe pain, inability to keep down fluids, fever, or any other symptoms concerning to you.

## 2017-05-06 NOTE — Anesthesia Procedure Notes (Signed)
Performed by: Demetrius Charity, CRNA Pre-anesthesia Checklist: Emergency Drugs available, Patient identified, Patient being monitored, Suction available and Timeout performed Patient Re-evaluated:Patient Re-evaluated prior to induction Oxygen Delivery Method: Nasal cannula Induction Type: IV induction

## 2017-05-10 ENCOUNTER — Encounter: Payer: Self-pay | Admitting: Gastroenterology

## 2017-05-11 ENCOUNTER — Inpatient Hospital Stay: Payer: Medicaid Other

## 2017-05-11 ENCOUNTER — Telehealth: Payer: Self-pay | Admitting: Internal Medicine

## 2017-05-11 ENCOUNTER — Inpatient Hospital Stay (HOSPITAL_BASED_OUTPATIENT_CLINIC_OR_DEPARTMENT_OTHER): Payer: Medicaid Other | Admitting: Internal Medicine

## 2017-05-11 VITALS — BP 94/60 | HR 59 | Temp 97.6°F | Resp 16 | Wt 136.4 lb

## 2017-05-11 DIAGNOSIS — Z5111 Encounter for antineoplastic chemotherapy: Secondary | ICD-10-CM | POA: Diagnosis not present

## 2017-05-11 DIAGNOSIS — C251 Malignant neoplasm of body of pancreas: Secondary | ICD-10-CM

## 2017-05-11 DIAGNOSIS — J439 Emphysema, unspecified: Secondary | ICD-10-CM

## 2017-05-11 DIAGNOSIS — K831 Obstruction of bile duct: Secondary | ICD-10-CM | POA: Diagnosis not present

## 2017-05-11 DIAGNOSIS — R948 Abnormal results of function studies of other organs and systems: Secondary | ICD-10-CM

## 2017-05-11 DIAGNOSIS — Z7689 Persons encountering health services in other specified circumstances: Secondary | ICD-10-CM | POA: Diagnosis not present

## 2017-05-11 DIAGNOSIS — R112 Nausea with vomiting, unspecified: Secondary | ICD-10-CM | POA: Diagnosis not present

## 2017-05-11 DIAGNOSIS — G893 Neoplasm related pain (acute) (chronic): Secondary | ICD-10-CM

## 2017-05-11 DIAGNOSIS — Z87891 Personal history of nicotine dependence: Secondary | ICD-10-CM

## 2017-05-11 DIAGNOSIS — Z79899 Other long term (current) drug therapy: Secondary | ICD-10-CM

## 2017-05-11 DIAGNOSIS — J45909 Unspecified asthma, uncomplicated: Secondary | ICD-10-CM | POA: Diagnosis not present

## 2017-05-11 DIAGNOSIS — J8410 Pulmonary fibrosis, unspecified: Secondary | ICD-10-CM

## 2017-05-11 DIAGNOSIS — I700 Atherosclerosis of aorta: Secondary | ICD-10-CM

## 2017-05-11 LAB — CBC WITH DIFFERENTIAL/PLATELET
BASOS PCT: 1 %
Basophils Absolute: 0.1 10*3/uL (ref 0–0.1)
Eosinophils Absolute: 0.2 10*3/uL (ref 0–0.7)
Eosinophils Relative: 1 %
HEMATOCRIT: 33.2 % — AB (ref 40.0–52.0)
HEMOGLOBIN: 11.2 g/dL — AB (ref 13.0–18.0)
LYMPHS ABS: 2.4 10*3/uL (ref 1.0–3.6)
Lymphocytes Relative: 15 %
MCH: 30.8 pg (ref 26.0–34.0)
MCHC: 33.9 g/dL (ref 32.0–36.0)
MCV: 91.1 fL (ref 80.0–100.0)
MONOS PCT: 8 %
Monocytes Absolute: 1.2 10*3/uL — ABNORMAL HIGH (ref 0.2–1.0)
NEUTROS ABS: 11.7 10*3/uL — AB (ref 1.4–6.5)
NEUTROS PCT: 75 %
Platelets: 292 10*3/uL (ref 150–440)
RBC: 3.64 MIL/uL — AB (ref 4.40–5.90)
RDW: 16.4 % — ABNORMAL HIGH (ref 11.5–14.5)
WBC: 15.6 10*3/uL — AB (ref 3.8–10.6)

## 2017-05-11 LAB — COMPREHENSIVE METABOLIC PANEL
ALT: 172 U/L — ABNORMAL HIGH (ref 17–63)
ANION GAP: 6 (ref 5–15)
AST: 44 U/L — ABNORMAL HIGH (ref 15–41)
Albumin: 3 g/dL — ABNORMAL LOW (ref 3.5–5.0)
Alkaline Phosphatase: 331 U/L — ABNORMAL HIGH (ref 38–126)
BUN: 11 mg/dL (ref 6–20)
CHLORIDE: 104 mmol/L (ref 101–111)
CO2: 27 mmol/L (ref 22–32)
Calcium: 8.7 mg/dL — ABNORMAL LOW (ref 8.9–10.3)
Creatinine, Ser: 0.57 mg/dL — ABNORMAL LOW (ref 0.61–1.24)
Glucose, Bld: 124 mg/dL — ABNORMAL HIGH (ref 65–99)
Potassium: 3.6 mmol/L (ref 3.5–5.1)
SODIUM: 137 mmol/L (ref 135–145)
Total Bilirubin: 2.5 mg/dL — ABNORMAL HIGH (ref 0.3–1.2)
Total Protein: 6.2 g/dL — ABNORMAL LOW (ref 6.5–8.1)

## 2017-05-11 MED ORDER — PALONOSETRON HCL INJECTION 0.25 MG/5ML
0.2500 mg | Freq: Once | INTRAVENOUS | Status: AC
  Administered 2017-05-11: 0.25 mg via INTRAVENOUS
  Filled 2017-05-11: qty 5

## 2017-05-11 MED ORDER — SODIUM CHLORIDE 0.9 % IV SOLN
2400.0000 mg/m2 | INTRAVENOUS | Status: DC
  Administered 2017-05-11: 4050 mg via INTRAVENOUS
  Filled 2017-05-11: qty 81

## 2017-05-11 MED ORDER — DEXTROSE 5 % IV SOLN
Freq: Once | INTRAVENOUS | Status: AC
  Administered 2017-05-11: 10:00:00 via INTRAVENOUS
  Filled 2017-05-11: qty 1000

## 2017-05-11 MED ORDER — OXALIPLATIN CHEMO INJECTION 100 MG/20ML
150.0000 mg | Freq: Once | INTRAVENOUS | Status: AC
  Administered 2017-05-11: 150 mg via INTRAVENOUS
  Filled 2017-05-11: qty 20

## 2017-05-11 MED ORDER — IRINOTECAN HCL CHEMO INJECTION 100 MG/5ML
120.0000 mg/m2 | Freq: Once | INTRAVENOUS | Status: AC
  Administered 2017-05-11: 200 mg via INTRAVENOUS
  Filled 2017-05-11: qty 10

## 2017-05-11 MED ORDER — LEUCOVORIN CALCIUM INJECTION 350 MG: 400.0000 mg/m2 | Freq: Once | INTRAVENOUS | Status: DC

## 2017-05-11 MED ORDER — ATROPINE SULFATE 1 MG/ML IJ SOLN
0.5000 mg | Freq: Once | INTRAMUSCULAR | Status: AC | PRN
  Administered 2017-05-11: 0.5 mg via INTRAVENOUS
  Filled 2017-05-11: qty 1

## 2017-05-11 MED ORDER — DEXAMETHASONE SODIUM PHOSPHATE 10 MG/ML IJ SOLN
10.0000 mg | Freq: Once | INTRAMUSCULAR | Status: AC
  Administered 2017-05-11: 10 mg via INTRAVENOUS
  Filled 2017-05-11: qty 1

## 2017-05-11 MED ORDER — LEUCOVORIN CALCIUM INJECTION 350 MG
700.0000 mg | Freq: Once | INTRAMUSCULAR | Status: AC
  Administered 2017-05-11: 700 mg via INTRAVENOUS
  Filled 2017-05-11: qty 25

## 2017-05-11 NOTE — Telephone Encounter (Signed)
Ofri- have you met with this patient yet?

## 2017-05-11 NOTE — Progress Notes (Signed)
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; [name]; No mets  # Dec 4th 2018-FOLFIRINOX [neo-adj chemo]   # Nov 2018-biliary obstruction status post ERCP; [name]; DEC 14th- ERCP [Dr.wohl] s/p metal stent   # Family history of pancreatic cancer     Malignant tumor of body of pancreas ([place])   03/24/2017 Initial Diagnosis    Malignant tumor of body of pancreas ([facility])        HISTORY OF PRESENTING ILLNESS:  Nathaniel Saunders 46 y.o.  male with borderline resectable adenocarcinoma the pancreas-stage II is currently on neoadjuvant chemotherapy-FOLFIRINOX status post cycle #1 approximately 2 weeks ago.  Patient also had episode of nausea vomiting diarrhea poor p.o. Intake-after first cycle-needing IV fluids/brief admission to the hospital.  In the interim patient-noted to have worsening LFTs; CT scan showed worsening pancreatic duct dilatation-underwent ERCP on the [id number] metallic stent placement.   Now patient feels much better his appetite is improving.  Denies any nausea vomiting.  No skin rash.  His pain is better controlled.  ROS: A complete 10 point review of system is done which is negative except mentioned above in history of present illness  MEDICAL HISTORY:  Asthma [well controlled] Past Medical History:  Diagnosis Date  . Asthma   . Pancreatic mass   . Pancreatic mass     SURGICAL HISTORY: Past Surgical History:  Procedure Laterality Date  . ERCP N/A 04/21/2017   Procedure: ENDOSCOPIC RETROGRADE CHOLANGIOPANCREATOGRAPHY (ERCP);  Surgeon: Lucilla Lame, MD;  Location: [facility]  ENDOSCOPY;  Service: Endoscopy;  Laterality: N/A;  . ERCP N/A 05/06/2017   Procedure: ENDOSCOPIC RETROGRADE CHOLANGIOPANCREATOGRAPHY (ERCP);  Surgeon: Lucilla Lame, MD;  Location: [facility] ENDOSCOPY;  Service: Endoscopy;  Laterality: N/A;  . PORTA CATH INSERTION N/A 04/18/2017   Procedure: PORTA CATH INSERTION;  Surgeon: Algernon Huxley, MD;  Location: [facility] CV LAB;  Service: Cardiovascular;  Laterality: N/A;    SOCIAL HISTORY:  he lives in [place]; sister/girl friend; he has children aged  6/18/ 6 years;  quit smkoing - 5 months; alcohol- none; truck driver. Social History   Socioeconomic History  . Marital status: Single    Spouse name: Not on file  . Number of children: Not on file  . Years of education: Not on file  . Highest education level: Not on file  Social Needs  . Financial resource strain: Not hard at all  . Food insecurity - worry: Never true  . Food insecurity - inability: Never true  . Transportation needs - medical: No  . Transportation needs - non-medical: No  Occupational History  . Not on file  Tobacco Use  . Smoking status: Former Smoker    Packs/day: 0.25    Years: 5.00    Pack years: 1.25    Types: Cigarettes    Last attempt to quit: 05/07/2015    Years since quitting: 2.0  . Smokeless tobacco: Never Used  Substance and Sexual Activity  . Alcohol use: No  . Drug use: No  . Sexual activity: Yes    Partners: Male  Other Topics Concern  . Not on file  Social History Narrative  . Not on  file    FAMILY HISTORY: 2 daughters [23 and 59]; boy- 79 years; one sister- 31 years. diagnosis of pancreatic cancer in mother; grandmother the patient's young age.  Family History  Problem Relation Age of Onset  . Pancreatic cancer Mother 78  . Pancreatic cancer Maternal Grandmother 60    ALLERGIES:  has No Known Allergies.  MEDICATIONS:  Current Outpatient Medications  Medication Sig Dispense Refill  . albuterol (PROVENTIL HFA;VENTOLIN HFA) 108 (90 Base)  MCG/ACT inhaler Inhale 1-2 puffs every 6 (six) hours as needed into the lungs for wheezing or shortness of breath.    . fentaNYL (DURAGESIC - DOSED MCG/HR) 25 MCG/HR patch Place 1 patch (25 mcg total) onto the skin every 3 (three) days. 3 patch 0  . lidocaine-prilocaine (EMLA) cream Apply 1 application topically as needed. Apply generously over the Mediport 45 minutes prior to chemotherapy. 30 g 0  . oxyCODONE-acetaminophen (PERCOCET/ROXICET) 5-325 MG tablet Take 1 tablet by mouth every 6 (six) hours as needed. 30 tablet 0  . potassium chloride 20 MEQ TBCR Take 10 mEq by mouth daily for 14 days. 14 tablet 0  . loperamide (IMODIUM) 2 MG capsule Take 1 capsule (2 mg total) by mouth as needed for diarrhea or loose stools. (Patient not taking: Reported on 05/11/2017) 30 capsule 0  . ondansetron (ZOFRAN ODT) 4 MG disintegrating tablet Take 1 tablet (4 mg total) by mouth every 8 (eight) hours as needed for nausea or vomiting. (Patient not taking: Reported on 05/11/2017) 20 tablet 0  . ondansetron (ZOFRAN) 8 MG tablet One pill every 8 hours as needed for nausea/vomitting. (Patient not taking: Reported on 05/11/2017) 40 tablet 1  . prochlorperazine (COMPAZINE) 10 MG tablet Take 1 tablet (10 mg total) by mouth every 6 (six) hours as needed for nausea or vomiting. (Patient not taking: Reported on 05/11/2017) 30 tablet 0  . promethazine (PHENERGAN) 12.5 MG tablet Take 1 tablet (12.5 mg total) by mouth every 8 (eight) hours as needed for nausea or vomiting. (Patient not taking: Reported on 05/11/2017) 10 tablet 0  . promethazine (PHENERGAN) 25 MG suppository Place 1 suppository (25 mg total) rectally every 8 (eight) hours as needed for nausea or vomiting. (Patient not taking: Reported on 05/11/2017) 12 each 0   No current facility-administered medications for this visit.    Facility-Administered Medications Ordered in Other Visits  Medication Dose Route Frequency Provider Last Rate Last Dose  . fluorouracil  (ADRUCIL) 4,050 mg in sodium chloride 0.9 % 69 mL chemo infusion  2,400 mg/m2 (Treatment Plan Recorded) Intravenous 1 day or 1 dose Charlaine Dalton R, MD      . irinotecan (CAMPTOSAR) 200 mg in dextrose 5 % 500 mL chemo infusion  120 mg/m2 (Treatment Plan Recorded) Intravenous Once Cammie Sickle, MD 340 mL/hr at 05/11/17 1301 200 mg at 05/11/17 1301  . leucovorin 700 mg in dextrose 5 % 250 mL infusion  700 mg Intravenous Once Cammie Sickle, MD 143 mL/hr at 05/11/17 1301 700 mg at 05/11/17 1301      .  PHYSICAL EXAMINATION: ECOG PERFORMANCE STATUS: 1 - Symptomatic but completely ambulatory  Vitals:   05/11/17 0843  BP: 94/60  Pulse: (!) 59  Resp: 16  Temp: 97.6 F (36.4 C)   Filed Weights   05/11/17 0843  Weight: 136 lb 6.4 oz (61.9 kg)    GENERAL: Well-nourished well-developed; Alert, no distress and comfortable.   Alone. EYES: no pallor or icterus OROPHARYNX: no thrush or ulceration; poor dentition  NECK: supple, no masses felt LYMPH:  no palpable lymphadenopathy in the cervical, axillary or inguinal regions LUNGS: clear to auscultation and  No wheeze or crackles HEART/CVS: regular rate & rhythm and no murmurs; No lower extremity edema ABDOMEN: abdomen soft, non-tender and normal bowel sounds Musculoskeletal:no cyanosis of digits and no clubbing  PSYCH: alert & oriented x 3 with fluent speech NEURO: no focal motor/sensory deficits SKIN:  no rashes or significant lesions  LABORATORY DATA:  I have reviewed the data as listed Lab Results  Component Value Date   WBC 15.6 (H) 05/11/2017   HGB 11.2 (L) 05/11/2017   HCT 33.2 (L) 05/11/2017   MCV 91.1 05/11/2017   PLT 292 05/11/2017   Recent Labs    03/24/17 1537  04/22/17 0443 04/26/17 0840  05/04/17 1057 05/06/17 2110 05/11/17 0808  NA  --    < > 137 136   < > 133* 136 137  K  --    < > 3.8 3.3*   < > 3.1* 3.4* 3.6  CL  --    < > 109 103   < > 99* 101 104  CO2  --    < > 22 27   < > 26 24 27    GLUCOSE  --    < > 94 158*   < > 134* 102* 124*  BUN  --    < > 8 10   < > 10 10 11   CREATININE  --    < > 0.64 0.74   < > 0.60* 0.58* 0.57*  CALCIUM  --    < > 8.7* 8.9   < > 9.2 8.9 8.7*  GFRNONAA  --    < > >60 >60   < > >60 >60 >60  GFRAA  --    < > >60 >60   < > >60 >60 >60  PROT 8.0   < > 6.5 7.7   < > 7.3 6.8 6.2*  ALBUMIN 4.2   < > 2.9* 3.6   < > 3.4* 3.1* 3.0*  AST 21   < > 198* 64*   < > 293* 256* 44*  ALT 13*   < > 512* 237*   < > 561* 547* 172*  ALKPHOS 54   < > 417* 315*   < > 514* 546* 331*  BILITOT 0.6   < > 6.5* 3.7*   < > 7.9* 5.5* 2.5*  BILIDIR 0.1  --  3.0* 1.8*  --   --   --   --   IBILI 0.5  --  3.5* 1.9*  --   --   --   --    < > = values in this interval not displayed.    RADIOGRAPHIC STUDIES: I have personally reviewed the radiological images as listed and agreed with the findings in the report. Ct Chest W Contrast  Result Date: 04/15/2017 CLINICAL DATA:  Staging evaluation. Newly diagnosed pancreatic cancer. EXAM: CT CHEST WITH CONTRAST TECHNIQUE: Multidetector CT imaging of the chest was performed during intravenous contrast administration. CONTRAST:  73mL ISOVUE-300 IOPAMIDOL (ISOVUE-300) INJECTION 61% COMPARISON:  CT abdomen pelvis 04/01/2017; CT head of pelvis 03/23/2017. FINDINGS: Cardiovascular: Normal heart size. No pericardial effusion. Coronary artery vascular calcifications. Aorta main pulmonary artery normal in caliber. Mediastinum/Nodes: Small hiatal hernia. No enlarged axillary, mediastinal or hilar lymphadenopathy. Lungs/Pleura: Central airways are patent. Bilateral regions of architectural distortion, bronchiectasis and linear opacities are demonstrated compatible with pulmonary parenchymal scarring. There is a  4 mm left lower lobe nodule (image 86; series 3). No pleural effusion or pneumothorax. Upper Abdomen: Incompletely visualized biliary ductal and pancreatic ductal dilatation. Incompletely visualized pancreatic masses. No acute process.  Musculoskeletal: Thoracic spine degenerative changes. No aggressive or acute appearing osseous lesions. IMPRESSION: 1. Bilateral pulmonary parenchymal findings most compatible with scarring and fibrosis. Possibility of interstitial lung disease is not excluded. Consider short-term follow-up high-resolution chest CT. 2. No definite evidence of pulmonary metastatic disease although evaluation is somewhat limited secondary to extensive parenchymal scarring and fibrosis. 3. Emphysema (ICD10-J43.9). Electronically Signed   By: Lovey Newcomer M.D.   On: 04/15/2017 14:06   Ct Abdomen Pelvis W Contrast  Result Date: 05/06/2017 CLINICAL DATA:  Pancreatic cancer. Stent placement today. Lower abdominal pain, vomiting. EXAM: CT ABDOMEN AND PELVIS WITH CONTRAST TECHNIQUE: Multidetector CT imaging of the abdomen and pelvis was performed using the standard protocol following bolus administration of intravenous contrast. CONTRAST:  51mL ISOVUE-300 IOPAMIDOL (ISOVUE-300) INJECTION 61% COMPARISON:  CT 04/29/2017 FINDINGS: Lower chest: Scarring in the left base. No effusions. Heart is normal size. Hepatobiliary: Pneumobilia noted within the central biliary ducts and gallbladder. Metallic biliary stent noted in place. Decreasing biliary ductal dilatation. No focal hepatic abnormality. Pancreas: Pancreatic mass in the pancreatic neck region is again noted, unchanged. Pancreatic ductal dilatation. Spleen: No focal abnormality.  Normal size. Adrenals/Urinary Tract: Cyst in the midpole of the right kidney. No adrenal abnormality. No focal renal abnormality. No stones or hydronephrosis. Urinary bladder is unremarkable. Stomach/Bowel: Stomach, large and small bowel grossly unremarkable. Vascular/Lymphatic: No evidence of aneurysm or adenopathy. Scattered aortic calcifications. Reproductive: No visible focal abnormality. Other: No free fluid or free air. Musculoskeletal: No acute bony abnormality. IMPRESSION: Pancreatic mass again noted  compatible with patient's known history of pancreatic malignancy. Interval exchange of plastic biliary stent for a metallic stent. Decreasing biliary ductal dilatation. Mild pneumobilia. Stable pancreatic ductal dilatation. Stable scarring in the left lung base. Electronically Signed   By: Rolm Baptise M.D.   On: 05/06/2017 20:58   Ct Abdomen Pelvis W Contrast  Result Date: 04/30/2017 CLINICAL DATA:  Recent diagnosis of pancreatic cancer. The patient received the first round of chemotherapy. On lab work, LFTs and bilirubin were elevated. EXAM: CT ABDOMEN AND PELVIS WITH CONTRAST TECHNIQUE: Multidetector CT imaging of the abdomen and pelvis was performed using the standard protocol following bolus administration of intravenous contrast. CONTRAST:  18mL ISOVUE-300 IOPAMIDOL (ISOVUE-300) INJECTION 61% COMPARISON:  April 01, 2017 and March 23, 2017 FINDINGS: Lower chest: Scarring is seen in the left base and anterior right base, unchanged. No metastatic lesions or evidence of pneumonia. The lung bases are otherwise unremarkable. Hepatobiliary: A biliary stent has been placed in the interval extending from central hepatic duct, through the common bile duct, into the region of the inferior pancreatic head near the junction with the duodenum. There is significant dilatation of the intra and extrahepatic bile ducts which is actually increased since March 23, 2017 despite the stent. The common bile duct on image 25 measures at least 14 mm. The gallbladder is mildly distended. Air in the intrahepatic bile ducts is consistent with a stent. The portal vein remains patent within the porta hepatis and liver. It is narrowed by the known malignancy as seen on series 2, image 24, similar in the interval. No liver masses are seen. Pancreas: The patient's known pancreatic malignancy is again identified in the neck measuring 3.1 by 2.2 cm on today's study. The main portal vein is narrowed as above.  Neither the celiac or SMA  trunks are encased. The SMV it appears remain patent as well. There is pancreatic dilatation more distally in the pancreas. There is likely dilatation of a pancreatic side duct on image 19. Spleen: Normal in size without focal abnormality. Adrenals/Urinary Tract: The adrenal glands are normal. There is a probable right renal cyst too small to characterize. The kidneys are otherwise normal as is the bladder. No ureteral stones identified. Stomach/Bowel: The stomach and small bowel are unremarkable with no obstruction. The colon and visualized appendix are normal. Vascular/Lymphatic: Mild atherosclerotic changes in the abdominal aorta. No discrete adenopathy identified. Reproductive: Prostate is unremarkable. Other: No abdominal wall hernia or abnormality. No abdominopelvic ascites. Musculoskeletal: No acute or significant osseous findings. IMPRESSION: 1. The biliary stent extends from a hepatic branch of the biliary tree, through the common bile duct, into the pancreatic head. It does not appear that the stent actually exits the pancreatic head on today's study but it is close to the junction of the pancreatic head and duodenum. Despite the stent, there is increasing intra and extrahepatic biliary duct dilatation which explains the patient's symptoms. 2. Changes of pancreatic carcinoma as described above. The carcinoma narrows the main portal vein. 3. Mild atherosclerotic change in the abdominal aorta. Electronically Signed   By: Dorise Bullion III M.D   On: 04/30/2017 01:33   Dg C-arm 1-60 Min-no Report  Result Date: 05/06/2017 Fluoroscopy was utilized by the requesting physician.  No radiographic interpretation.   Dg C-arm 1-60 Min-no Report  Result Date: 04/21/2017 Fluoroscopy was utilized by the requesting physician.  No radiographic interpretation.    ASSESSMENT & PLAN:   Malignant tumor of body of pancreas ([place]) # Pancreatic adenocarcinoma-borderline resectable; status post 1 cycle of  neoadjuvant FOLFIRINOX; tolerated moderate side effects - [nausea vomiting diarrhea;elevated liver function [see discussion below].  # proceed with cycle #2 of FOLFIRINOX today- Labs today reviewed;  acceptable for treatment today except elevated LFTs.  #Elevated LFTs -biliary/pancreatic ductal obstruction status post ERCP- bilirubin 2.5; improving- but-recommend decrease iri to 120 mg/m2.   # Genetic testing-  Interested in genetic testing; will have blood drawn at next week.  Discussed the potential issues with long-term disability insurance/life insurance.  Check [facility] panel.  Patient consents to genetic testing.  # Pain control-better controlled; continue fentanyl patch 25 g and also Percocet.  Better controlled.  #Labs 1 week; possible fluids; follow-up in 2 weeks labs; chemo.; jan 3rd at [facility].   All questions were answered. The patient knows to call the clinic with any problems, questions or concerns.    Cammie Sickle, MD 05/11/2017 1:34 PM

## 2017-05-11 NOTE — Assessment & Plan Note (Addendum)
#   Pancreatic adenocarcinoma-borderline resectable; status post 1 cycle of neoadjuvant FOLFIRINOX; tolerated moderate side effects - [nausea vomiting diarrhea;elevated liver function [see discussion below].  # proceed with cycle #2 of FOLFIRINOX today- Labs today reviewed;  acceptable for treatment today except elevated LFTs.  #Elevated LFTs -biliary/pancreatic ductal obstruction status post ERCP- bilirubin 2.5; improving- but-recommend decrease iri to 120 mg/m2.   # Genetic testing-  Interested in genetic testing; will have blood drawn at next week.  Discussed the potential issues with long-term disability insurance/life insurance.  Check [facility] panel.  Patient consents to genetic testing.  # Pain control-better controlled; continue fentanyl patch 25 g and also Percocet.  Better controlled.  #Labs 1 week; possible fluids; follow-up in 2 weeks labs; chemo.; jan 3rd at [facility].

## 2017-05-11 NOTE — Telephone Encounter (Signed)
Heather- reminder that pt needs to have 54 [facility] Multi-Cancer Panel next week/lab draw. Thx

## 2017-05-11 NOTE — Progress Notes (Signed)
Dr. Rogue Bussing called to say proceed with treatment today, reviewed labs.

## 2017-05-12 NOTE — Telephone Encounter (Signed)
Spoke with genetic counselor- RN will go ahead and enter orders and genetic counselor will contact patient with apt. Will proceed with genetic testing per md order next week

## 2017-05-13 ENCOUNTER — Inpatient Hospital Stay: Payer: Medicaid Other

## 2017-05-13 ENCOUNTER — Telehealth: Payer: Self-pay | Admitting: Genetic Counselor

## 2017-05-13 VITALS — BP 106/69 | HR 65 | Temp 97.2°F | Resp 18

## 2017-05-13 DIAGNOSIS — Z5111 Encounter for antineoplastic chemotherapy: Secondary | ICD-10-CM | POA: Diagnosis not present

## 2017-05-13 DIAGNOSIS — C251 Malignant neoplasm of body of pancreas: Secondary | ICD-10-CM

## 2017-05-13 MED ORDER — PEGFILGRASTIM 6 MG/0.6ML ~~LOC~~ PSKT
6.0000 mg | PREFILLED_SYRINGE | Freq: Once | SUBCUTANEOUS | Status: AC
  Administered 2017-05-13: 6 mg via SUBCUTANEOUS
  Filled 2017-05-13: qty 0.6

## 2017-05-13 MED ORDER — SODIUM CHLORIDE 0.9% FLUSH
10.0000 mL | INTRAVENOUS | Status: DC | PRN
  Administered 2017-05-13: 10 mL
  Filled 2017-05-13: qty 10

## 2017-05-13 MED ORDER — HEPARIN SOD (PORK) LOCK FLUSH 100 UNIT/ML IV SOLN
500.0000 [IU] | Freq: Once | INTRAVENOUS | Status: AC | PRN
  Administered 2017-05-13: 500 [IU]
  Filled 2017-05-13: qty 5

## 2017-05-13 NOTE — Telephone Encounter (Signed)
[name] was referred for genetic counseling by Dr. Rogue Bussing due to a personal and family history of cancer. He has opted to schedule this telegenetics visit to be done by phone on Friday 05/20/17 at [facility]. He has an appointment for Labs on 12/26 and I will arrange for a blood draw for Genetics at that time.   Nathaniel Saunders, [place], [place] Genetic Counselor Phone: [phone]

## 2017-05-18 ENCOUNTER — Inpatient Hospital Stay: Payer: Medicaid Other

## 2017-05-18 DIAGNOSIS — C251 Malignant neoplasm of body of pancreas: Secondary | ICD-10-CM

## 2017-05-18 DIAGNOSIS — Z5111 Encounter for antineoplastic chemotherapy: Secondary | ICD-10-CM | POA: Diagnosis not present

## 2017-05-18 LAB — CBC WITH DIFFERENTIAL/PLATELET
BASOS PCT: 0 %
Basophils Absolute: 0.1 10*3/uL (ref 0–0.1)
EOS ABS: 0.4 10*3/uL (ref 0–0.7)
Eosinophils Relative: 2 %
HCT: 34.1 % — ABNORMAL LOW (ref 40.0–52.0)
Hemoglobin: 11.6 g/dL — ABNORMAL LOW (ref 13.0–18.0)
LYMPHS ABS: 2.5 10*3/uL (ref 1.0–3.6)
Lymphocytes Relative: 11 %
MCH: 31 pg (ref 26.0–34.0)
MCHC: 33.9 g/dL (ref 32.0–36.0)
MCV: 91.4 fL (ref 80.0–100.0)
MONOS PCT: 7 %
Monocytes Absolute: 1.6 10*3/uL — ABNORMAL HIGH (ref 0.2–1.0)
NEUTROS PCT: 80 %
Neutro Abs: 17.5 10*3/uL — ABNORMAL HIGH (ref 1.4–6.5)
PLATELETS: 300 10*3/uL (ref 150–440)
RBC: 3.73 MIL/uL — AB (ref 4.40–5.90)
RDW: 16.5 % — AB (ref 11.5–14.5)
WBC: 22 10*3/uL — ABNORMAL HIGH (ref 3.8–10.6)

## 2017-05-18 LAB — COMPREHENSIVE METABOLIC PANEL
ALT: 67 U/L — AB (ref 17–63)
AST: 30 U/L (ref 15–41)
Albumin: 3.4 g/dL — ABNORMAL LOW (ref 3.5–5.0)
Alkaline Phosphatase: 290 U/L — ABNORMAL HIGH (ref 38–126)
Anion gap: 5 (ref 5–15)
BUN: 12 mg/dL (ref 6–20)
CHLORIDE: 102 mmol/L (ref 101–111)
CO2: 29 mmol/L (ref 22–32)
CREATININE: 0.65 mg/dL (ref 0.61–1.24)
Calcium: 9.2 mg/dL (ref 8.9–10.3)
GFR calc non Af Amer: >60 mL/min (ref >60)
Glucose, Bld: 122 mg/dL — ABNORMAL HIGH (ref 65–99)
Potassium: 4.3 mmol/L (ref 3.5–5.1)
SODIUM: 136 mmol/L (ref 135–145)
Total Bilirubin: 1.9 mg/dL — ABNORMAL HIGH (ref 0.3–1.2)
Total Protein: 7.2 g/dL (ref 6.5–8.1)

## 2017-05-18 MED ORDER — HEPARIN SOD (PORK) LOCK FLUSH 100 UNIT/ML IV SOLN
500.0000 [IU] | Freq: Once | INTRAVENOUS | Status: AC
  Administered 2017-05-18: 500 [IU] via INTRAVENOUS
  Filled 2017-05-18: qty 5

## 2017-05-19 ENCOUNTER — Inpatient Hospital Stay: Payer: Medicaid Other

## 2017-05-19 NOTE — Progress Notes (Signed)
Nutrition  Patient called to cancel nutrition appointment for today.  Devonne Lalani B. Zenia Resides, [place], [place] Registered Dietitian [phone] (pager)

## 2017-05-20 ENCOUNTER — Encounter: Payer: Self-pay | Admitting: Genetic Counselor

## 2017-05-20 ENCOUNTER — Telehealth: Payer: Self-pay | Admitting: Genetic Counselor

## 2017-05-20 DIAGNOSIS — Z1379 Encounter for other screening for genetic and chromosomal anomalies: Secondary | ICD-10-CM

## 2017-05-20 HISTORY — DX: Encounter for other screening for genetic and chromosomal anomalies: Z13.79

## 2017-05-20 NOTE — Telephone Encounter (Signed)
Cancer Genetics            Telegenetics Initial Visit    Patient Name: Nathaniel Saunders Patient DOB: [birth year] Patient Age: 46 y.o. Phone Call Date: 05/20/2017  Referring Provider: Charlaine Dalton, MD  Reason for Visit: Evaluate for hereditary susceptibility to cancer    Assessment and Plan:  . Mr. Ransier's history of pancreatic cancer in himself and his mother is not highly suggestive of a hereditary predisposition to cancer even with having two affected generations. A genetics evaluation is warranted to rule out a hereditary cause.   [facility] Kitchen Testing is recommended to determine whether he has a pathogenic mutation that will impact his screening and risk-reduction for cancer. A negative result will be reassuring for his family.  . Mr. Shiroma wished to pursue genetic testing. His blood was already drawn on 05/18/17. Analysis will include the 83 genes on [facility]'s Multi-Cancer panel (ALK, APC, ATM, AXIN2, BAP1, BARD1, BLM, BMPR1A, BRCA1, BRCA2, BRIP1, CASR, CDC73, CDH1, CDK4, CDKN1B, CDKN1C, CDKN2A, CEBPA, CHEK2, CTNNA1, DICER1, DIS3L2, EGFR, EPCAM, FH, FLCN, GATA2, GPC3, GREM1, HOXB13, HRAS, KIT, MAX, MEN1, MET, MITF, MLH1, MSH2, MSH3, MSH6, MUTYH, NBN, NF1, NF2, NTHL1, PALB2, PDGFRA, PHOX2B, PMS2, POLD1, POLE, POT1, PRKAR1A, PTCH1, PTEN, RAD50, RAD51C, RAD51D, RB1, RECQL4, RET, RUNX1, SDHA, SDHAF2, SDHB, SDHC, SDHD, SMAD4, SMARCA4, SMARCB1, SMARCE1, STK11, SUFU, TERC, TERT, TMEM127, TP53, TSC1, TSC2, VHL, WRN, WT1).   . Results should be available in approximately 2-3 weeks, at which point we will contact him and address implications for him as well as address genetic testing for at-risk family members, if needed.     Dr. Grayland Ormond was available for questions concerning this case. Total time spent by counseling by phone was approximately 25 minutes.   _____________________________________________________________________   History of Present Illness: Mr. Nathaniel Radney  Saunders, a 46 y.o. male, was referred for genetic counseling to discuss the possibility of a hereditary predisposition to cancer and discuss whether genetic testing is warranted. This was a telegenetics visit via phone.  [name] was diagnosed with pancreatic cancer at the age of 89. He is s/p neoadjuvant chemotherapy and will be having surgery on 05/26/17.   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; [name]; No mets  # Dec 4th 2018-FOLFIRINOX [neo-adj chemo]   # Nov 2018-biliary obstruction status post ERCP; [name]; DEC 14th- ERCP [Dr.wohl] s/p metal stent   # Family history of pancreatic cancer     Malignant tumor of body of pancreas ([place])   03/24/2017 Initial Diagnosis    Malignant tumor of body of pancreas ([place])       Past Medical History:  Diagnosis Date  . Asthma   . Pancreatic mass   . Pancreatic mass     Past Surgical History:  Procedure Laterality Date  . ERCP N/A 04/21/2017   Procedure: ENDOSCOPIC RETROGRADE CHOLANGIOPANCREATOGRAPHY (ERCP);  Surgeon: Lucilla Lame, MD;  Location: [facility] ENDOSCOPY;  Service: Endoscopy;  Laterality: N/A;  . ERCP N/A 05/06/2017   Procedure: ENDOSCOPIC RETROGRADE CHOLANGIOPANCREATOGRAPHY (ERCP);  Surgeon: Lucilla Lame, MD;  Location: [facility] ENDOSCOPY;  Service: Endoscopy;  Laterality: N/A;  . PORTA CATH INSERTION N/A 04/18/2017   Procedure: PORTA CATH INSERTION;  Surgeon: Algernon Huxley, MD;  Location: [facility] CV LAB;  Service: Cardiovascular;  Laterality: N/A;    Family History:  Significant diagnoses include the following:  Family History  Problem Relation Age of Onset  . Pancreatic cancer Mother 45       deceased 62  . Diabetes Father   . Colon cancer Maternal Grandmother 84       deceased 54    Additionally, Mr. Deery has 2 daughters (ages 18 and 18) and  a son (age 59). He has one sister (age 92). His mother (noted above) had a brother and 3 sisters. He clarified that his maternal grandmother had colon cancer and not pancreatic cancer. His father died at 44, unrelated to cancer. He had 2 brothers and a sister.  Mr. Malachi's ancestry is African American. There is no known Jewish ancestry and no consanguinity.  Discussion: We reviewed the characteristics, features and inheritance patterns of hereditary cancer syndromes. We discussed his risk of harboring a mutation in the context of his personal and family history. We discussed the process of genetic testing, insurance coverage and implications of results: positive, negative and variant of unknown significance (VUS).   Mr. Rickels's questions were answered to his satisfaction today and he is welcome to call with any additional questions or concerns. Thank you for the referral and allowing [place] to share in the care of your patient.    [name], MS, [place] Certified Genetic Counselor phone: [phone]

## 2017-05-25 ENCOUNTER — Inpatient Hospital Stay: Payer: Medicaid Other | Attending: Internal Medicine | Admitting: Internal Medicine

## 2017-05-25 ENCOUNTER — Inpatient Hospital Stay: Payer: Medicaid Other

## 2017-05-25 ENCOUNTER — Encounter: Payer: Self-pay | Admitting: Internal Medicine

## 2017-05-25 VITALS — BP 99/65 | HR 74 | Temp 98.0°F | Resp 16 | Wt 137.6 lb

## 2017-05-25 DIAGNOSIS — Z79899 Other long term (current) drug therapy: Secondary | ICD-10-CM | POA: Diagnosis not present

## 2017-05-25 DIAGNOSIS — Z87891 Personal history of nicotine dependence: Secondary | ICD-10-CM | POA: Diagnosis not present

## 2017-05-25 DIAGNOSIS — Z7689 Persons encountering health services in other specified circumstances: Secondary | ICD-10-CM | POA: Diagnosis not present

## 2017-05-25 DIAGNOSIS — Z5111 Encounter for antineoplastic chemotherapy: Secondary | ICD-10-CM | POA: Diagnosis present

## 2017-05-25 DIAGNOSIS — Z800 Family history of malignant neoplasm of digestive organs: Secondary | ICD-10-CM | POA: Insufficient documentation

## 2017-05-25 DIAGNOSIS — J45909 Unspecified asthma, uncomplicated: Secondary | ICD-10-CM | POA: Insufficient documentation

## 2017-05-25 DIAGNOSIS — J8410 Pulmonary fibrosis, unspecified: Secondary | ICD-10-CM | POA: Diagnosis not present

## 2017-05-25 DIAGNOSIS — C251 Malignant neoplasm of body of pancreas: Secondary | ICD-10-CM | POA: Insufficient documentation

## 2017-05-25 DIAGNOSIS — G893 Neoplasm related pain (acute) (chronic): Secondary | ICD-10-CM | POA: Insufficient documentation

## 2017-05-25 DIAGNOSIS — R948 Abnormal results of function studies of other organs and systems: Secondary | ICD-10-CM | POA: Diagnosis not present

## 2017-05-25 DIAGNOSIS — I700 Atherosclerosis of aorta: Secondary | ICD-10-CM | POA: Insufficient documentation

## 2017-05-25 LAB — COMPREHENSIVE METABOLIC PANEL
ALT: 48 U/L (ref 17–63)
ANION GAP: 6 (ref 5–15)
AST: 34 U/L (ref 15–41)
Albumin: 3.2 g/dL — ABNORMAL LOW (ref 3.5–5.0)
Alkaline Phosphatase: 216 U/L — ABNORMAL HIGH (ref 38–126)
BILIRUBIN TOTAL: 1.4 mg/dL — AB (ref 0.3–1.2)
BUN: 9 mg/dL (ref 6–20)
CO2: 27 mmol/L (ref 22–32)
Calcium: 8.8 mg/dL — ABNORMAL LOW (ref 8.9–10.3)
Chloride: 108 mmol/L (ref 101–111)
Creatinine, Ser: 0.78 mg/dL (ref 0.61–1.24)
Glucose, Bld: 126 mg/dL — ABNORMAL HIGH (ref 65–99)
POTASSIUM: 3.7 mmol/L (ref 3.5–5.1)
Sodium: 141 mmol/L (ref 135–145)
TOTAL PROTEIN: 6.8 g/dL (ref 6.5–8.1)

## 2017-05-25 LAB — CBC WITH DIFFERENTIAL/PLATELET
BASOS PCT: 0 %
Basophils Absolute: 0.1 10*3/uL (ref 0–0.1)
Eosinophils Absolute: 0.1 10*3/uL (ref 0–0.7)
Eosinophils Relative: 1 %
HEMATOCRIT: 33.8 % — AB (ref 40.0–52.0)
Hemoglobin: 11.6 g/dL — ABNORMAL LOW (ref 13.0–18.0)
Lymphocytes Relative: 14 %
Lymphs Abs: 2.1 10*3/uL (ref 1.0–3.6)
MCH: 31.7 pg (ref 26.0–34.0)
MCHC: 34.3 g/dL (ref 32.0–36.0)
MCV: 92.2 fL (ref 80.0–100.0)
MONO ABS: 0.9 10*3/uL (ref 0.2–1.0)
Monocytes Relative: 6 %
NEUTROS ABS: 12.1 10*3/uL — AB (ref 1.4–6.5)
Neutrophils Relative %: 79 %
Platelets: 263 10*3/uL (ref 150–440)
RBC: 3.66 MIL/uL — ABNORMAL LOW (ref 4.40–5.90)
RDW: 18.4 % — AB (ref 11.5–14.5)
WBC: 15.3 10*3/uL — ABNORMAL HIGH (ref 3.8–10.6)

## 2017-05-25 MED ORDER — PALONOSETRON HCL INJECTION 0.25 MG/5ML
0.2500 mg | Freq: Once | INTRAVENOUS | Status: AC
  Administered 2017-05-25: 0.25 mg via INTRAVENOUS
  Filled 2017-05-25: qty 5

## 2017-05-25 MED ORDER — LEUCOVORIN CALCIUM INJECTION 350 MG
700.0000 mg | Freq: Once | INTRAVENOUS | Status: AC
  Administered 2017-05-25: 700 mg via INTRAVENOUS
  Filled 2017-05-25: qty 35

## 2017-05-25 MED ORDER — OXALIPLATIN CHEMO INJECTION 100 MG/20ML
150.0000 mg | Freq: Once | INTRAVENOUS | Status: AC
  Administered 2017-05-25: 150 mg via INTRAVENOUS
  Filled 2017-05-25: qty 20

## 2017-05-25 MED ORDER — FLUOROURACIL CHEMO INJECTION 5 GM/100ML
2400.0000 mg/m2 | INTRAVENOUS | Status: DC
  Administered 2017-05-25: 4050 mg via INTRAVENOUS
  Filled 2017-05-25: qty 81

## 2017-05-25 MED ORDER — DEXAMETHASONE SODIUM PHOSPHATE 10 MG/ML IJ SOLN
INTRAMUSCULAR | Status: AC
  Filled 2017-05-25: qty 1

## 2017-05-25 MED ORDER — IRINOTECAN HCL CHEMO INJECTION 100 MG/5ML
120.0000 mg/m2 | Freq: Once | INTRAVENOUS | Status: DC
  Filled 2017-05-25: qty 10

## 2017-05-25 MED ORDER — DEXTROSE 5 % IV SOLN
Freq: Once | INTRAVENOUS | Status: AC
  Administered 2017-05-25: 10:00:00 via INTRAVENOUS
  Filled 2017-05-25: qty 1000

## 2017-05-25 MED ORDER — ATROPINE SULFATE 1 MG/ML IJ SOLN
0.5000 mg | Freq: Once | INTRAMUSCULAR | Status: AC | PRN
  Administered 2017-05-25: 0.5 mg via INTRAVENOUS
  Filled 2017-05-25: qty 1

## 2017-05-25 MED ORDER — DEXAMETHASONE SODIUM PHOSPHATE 10 MG/ML IJ SOLN
10.0000 mg | Freq: Once | INTRAMUSCULAR | Status: AC
  Administered 2017-05-25: 10 mg via INTRAVENOUS
  Filled 2017-05-25: qty 1

## 2017-05-25 NOTE — Assessment & Plan Note (Addendum)
#   Pancreatic adenocarcinoma-borderline resectable; status post 2 cycle of neoadjuvant FOLFIRINOX; tolerated better   # proceed with cycle #3 of FOLFIRINOX today- Labs today reviewed;  acceptable for treatment today except mild elevated LFTs. Will get CT scan after 4 cycles; will order CT next vist.  Awaiting surgical evaluation at [facility] tomorrow.  #Elevated LFTs -biliary/pancreatic ductal obstruction status post ERCP- bilirubin 2.5; improving- but- continue iri to 120 mg/m2.   # Genetic testing-  s/p genetic counselling; s/p blood draw.     # Pain control-better controlled; continue fentanyl patch 25 g and also Percocet.  Better controlled.  #Labs 1 week; possible fluids; follow-up in 2 weeks labs; FOLFIRINOX; onprod-day-2/pumpf off- [place].

## 2017-05-25 NOTE — Progress Notes (Signed)
[place] CONSULT NOTE  Patient Care Team: Maeola Sarah, [name], RN as Registered Nurse  CHIEF COMPLAINTS/PURPOSE OF CONSULTATION: Pancreatic mass  #  Oncology History   # November 2018- PANCREATIC ADENOCARCINOMA pancreatic neck mass [~2.5cm];  [name]; [name]; No mets  # Dec 4th 2018-FOLFIRINOX [neo-adj chemo]   # Nov 2018-biliary obstruction status post ERCP; [name]; DEC 14th- ERCP [Dr.wohl] s/p metal stent   # Family history of pancreatic cancer     Malignant tumor of body of pancreas ([place])   03/24/2017 Initial Diagnosis    Malignant tumor of body of pancreas ([facility])        HISTORY OF PRESENTING ILLNESS:  Nathaniel Saunders 47 y.o.  male with borderline resectable adenocarcinoma the pancreas-stage II is currently on neoadjuvant chemotherapy-FOLFIRINOX status post cycle #2 approximately 2 weeks ago.  Patient did not need to be admitted to the hospital after the cycle of chemotherapy.  In the interim patient had genetic counseling over the phone.    Denies any worsening abdominal pain.  His pain is stable on the fentanyl patch; breakthrough pain medication.  Now patient feels much better his appetite is improving.  Is gaining weight.No skin rash.  Denies any nausea vomiting diarrhea  ROS: A complete 10 point review of system is done which is negative except mentioned above in history of present illness  MEDICAL HISTORY:  Asthma [well controlled] Past Medical History:  Diagnosis Date  . Asthma   . Pancreatic mass   . Pancreatic mass     SURGICAL HISTORY: Past Surgical History:  Procedure Laterality Date  . ERCP N/A 04/21/2017   Procedure: ENDOSCOPIC RETROGRADE CHOLANGIOPANCREATOGRAPHY (ERCP);  Surgeon: Lucilla Lame, MD;  Location: [facility] ENDOSCOPY;  Service: Endoscopy;  Laterality: N/A;  .  ERCP N/A 05/06/2017   Procedure: ENDOSCOPIC RETROGRADE CHOLANGIOPANCREATOGRAPHY (ERCP);  Surgeon: Lucilla Lame, MD;  Location: [facility] ENDOSCOPY;  Service: Endoscopy;  Laterality: N/A;  . PORTA CATH INSERTION N/A 04/18/2017   Procedure: PORTA CATH INSERTION;  Surgeon: Algernon Huxley, MD;  Location: [place] CV LAB;  Service: Cardiovascular;  Laterality: N/A;    SOCIAL HISTORY:  he lives in [place]; sister/girl friend; he has children aged  14/18/ 6 years;  quit smkoing - 5 months; alcohol- none; truck driver. Social History   Socioeconomic History  . Marital status: Single    Spouse name: Not on file  . Number of children: Not on file  . Years of education: Not on file  . Highest education level: Not on file  Social Needs  . Financial resource strain: Not hard at all  . Food insecurity - worry: Never true  . Food insecurity - inability: Never true  . Transportation needs - medical: No  . Transportation needs - non-medical: No  Occupational History  . Not on file  Tobacco Use  . Smoking status: Former Smoker    Packs/day: 0.25    Years: 5.00    Pack years: 1.25    Types: Cigarettes    Last attempt to quit: 05/07/2015    Years since quitting: 2.0  . Smokeless tobacco: Never Used  Substance and Sexual Activity  . Alcohol use: No  . Drug use: No  . Sexual activity: Yes    Partners: Male  Other Topics Concern  . Not on file  Social History Narrative  . Not on  file    FAMILY HISTORY: 2 daughters [23 and 71]; boy- 82 years; one sister- 31 years. diagnosis of pancreatic cancer in mother; grandmother the patient's young age.  Family History  Problem Relation Age of Onset  . Pancreatic cancer Mother 29       deceased 83  . Diabetes Father   . Colon cancer Maternal Grandmother 66       deceased 53    ALLERGIES:  has No Known Allergies.  MEDICATIONS:  Current Outpatient Medications  Medication Sig Dispense Refill  . albuterol (PROVENTIL HFA;VENTOLIN HFA) 108 (90 Base)  MCG/ACT inhaler Inhale 1-2 puffs every 6 (six) hours as needed into the lungs for wheezing or shortness of breath.    . fentaNYL (DURAGESIC - DOSED MCG/HR) 25 MCG/HR patch Place 1 patch (25 mcg total) onto the skin every 3 (three) days. 3 patch 0  . lidocaine-prilocaine (EMLA) cream Apply 1 application topically as needed. Apply generously over the Mediport 45 minutes prior to chemotherapy. 30 g 0  . oxyCODONE-acetaminophen (PERCOCET/ROXICET) 5-325 MG tablet Take 1 tablet by mouth every 6 (six) hours as needed. 30 tablet 0  . prochlorperazine (COMPAZINE) 10 MG tablet Take 1 tablet (10 mg total) by mouth every 6 (six) hours as needed for nausea or vomiting. 30 tablet 0  . promethazine (PHENERGAN) 12.5 MG tablet Take 1 tablet (12.5 mg total) by mouth every 8 (eight) hours as needed for nausea or vomiting. 10 tablet 0  . promethazine (PHENERGAN) 25 MG suppository Place 1 suppository (25 mg total) rectally every 8 (eight) hours as needed for nausea or vomiting. 12 each 0  . loperamide (IMODIUM) 2 MG capsule Take 1 capsule (2 mg total) by mouth as needed for diarrhea or loose stools. (Patient not taking: Reported on 05/25/2017) 30 capsule 0  . ondansetron (ZOFRAN ODT) 4 MG disintegrating tablet Take 1 tablet (4 mg total) by mouth every 8 (eight) hours as needed for nausea or vomiting. (Patient not taking: Reported on 05/25/2017) 20 tablet 0  . ondansetron (ZOFRAN) 8 MG tablet One pill every 8 hours as needed for nausea/vomitting. (Patient not taking: Reported on 05/25/2017) 40 tablet 1  . potassium chloride 20 MEQ TBCR Take 10 mEq by mouth daily for 14 days. 14 tablet 0   No current facility-administered medications for this visit.    Facility-Administered Medications Ordered in Other Visits  Medication Dose Route Frequency Provider Last Rate Last Dose  . fluorouracil (ADRUCIL) 4,050 mg in sodium chloride 0.9 % 69 mL chemo infusion  2,400 mg/m2 (Treatment Plan Recorded) Intravenous 1 day or 1 dose Charlaine Dalton R, MD      . irinotecan (CAMPTOSAR) 200 mg in dextrose 5 % 500 mL chemo infusion  120 mg/m2 (Treatment Plan Recorded) Intravenous Once Cammie Sickle, MD   Stopped at 05/25/17 1343      .  PHYSICAL EXAMINATION: ECOG PERFORMANCE STATUS: 1 - Symptomatic but completely ambulatory  Vitals:   05/25/17 0937  BP: 99/65  Pulse: 74  Resp: 16  Temp: 98 F (36.7 C)   Filed Weights   05/25/17 0937  Weight: 137 lb 9.6 oz (62.4 kg)    GENERAL: Well-nourished well-developed; Alert, no distress and comfortable.   Alone. EYES: no pallor or icterus OROPHARYNX: no thrush or ulceration; poor dentition  NECK: supple, no masses felt LYMPH:  no palpable lymphadenopathy in the cervical, axillary or inguinal regions LUNGS: clear to auscultation and  No wheeze or crackles HEART/CVS: regular rate & rhythm and  no murmurs; No lower extremity edema ABDOMEN: abdomen soft, non-tender and normal bowel sounds Musculoskeletal:no cyanosis of digits and no clubbing  PSYCH: alert & oriented x 3 with fluent speech NEURO: no focal motor/sensory deficits SKIN:  no rashes or significant lesions  LABORATORY DATA:  I have reviewed the data as listed Lab Results  Component Value Date   WBC 15.3 (H) 05/25/2017   HGB 11.6 (L) 05/25/2017   HCT 33.8 (L) 05/25/2017   MCV 92.2 05/25/2017   PLT 263 05/25/2017   Recent Labs    03/24/17 1537  04/22/17 0443 04/26/17 0840  05/11/17 0808 05/18/17 0917 05/25/17 0842  NA  --    < > 137 136   < > 137 136 141  K  --    < > 3.8 3.3*   < > 3.6 4.3 3.7  CL  --    < > 109 103   < > 104 102 108  CO2  --    < > 22 27   < > 27 29 27   GLUCOSE  --    < > 94 158*   < > 124* 122* 126*  BUN  --    < > 8 10   < > 11 12 9   CREATININE  --    < > 0.64 0.74   < > 0.57* 0.65 0.78  CALCIUM  --    < > 8.7* 8.9   < > 8.7* 9.2 8.8*  GFRNONAA  --    < > >60 >60   < > >60 >60 >60  GFRAA  --    < > >60 >60   < > >60 >60 >60  PROT 8.0   < > 6.5 7.7   < > 6.2* 7.2 6.8   ALBUMIN 4.2   < > 2.9* 3.6   < > 3.0* 3.4* 3.2*  AST 21   < > 198* 64*   < > 44* 30 34  ALT 13*   < > 512* 237*   < > 172* 67* 48  ALKPHOS 54   < > 417* 315*   < > 331* 290* 216*  BILITOT 0.6   < > 6.5* 3.7*   < > 2.5* 1.9* 1.4*  BILIDIR 0.1  --  3.0* 1.8*  --   --   --   --   IBILI 0.5  --  3.5* 1.9*  --   --   --   --    < > = values in this interval not displayed.    RADIOGRAPHIC STUDIES: I have personally reviewed the radiological images as listed and agreed with the findings in the report. Ct Abdomen Pelvis W Contrast  Result Date: 05/06/2017 CLINICAL DATA:  Pancreatic cancer. Stent placement today. Lower abdominal pain, vomiting. EXAM: CT ABDOMEN AND PELVIS WITH CONTRAST TECHNIQUE: Multidetector CT imaging of the abdomen and pelvis was performed using the standard protocol following bolus administration of intravenous contrast. CONTRAST:  42mL ISOVUE-300 IOPAMIDOL (ISOVUE-300) INJECTION 61% COMPARISON:  CT 04/29/2017 FINDINGS: Lower chest: Scarring in the left base. No effusions. Heart is normal size. Hepatobiliary: Pneumobilia noted within the central biliary ducts and gallbladder. Metallic biliary stent noted in place. Decreasing biliary ductal dilatation. No focal hepatic abnormality. Pancreas: Pancreatic mass in the pancreatic neck region is again noted, unchanged. Pancreatic ductal dilatation. Spleen: No focal abnormality.  Normal size. Adrenals/Urinary Tract: Cyst in the midpole of the right kidney. No adrenal abnormality. No focal renal abnormality. No stones or  hydronephrosis. Urinary bladder is unremarkable. Stomach/Bowel: Stomach, large and small bowel grossly unremarkable. Vascular/Lymphatic: No evidence of aneurysm or adenopathy. Scattered aortic calcifications. Reproductive: No visible focal abnormality. Other: No free fluid or free air. Musculoskeletal: No acute bony abnormality. IMPRESSION: Pancreatic mass again noted compatible with patient's known history of pancreatic  malignancy. Interval exchange of plastic biliary stent for a metallic stent. Decreasing biliary ductal dilatation. Mild pneumobilia. Stable pancreatic ductal dilatation. Stable scarring in the left lung base. Electronically Signed   By: Rolm Baptise M.D.   On: 05/06/2017 20:58   Ct Abdomen Pelvis W Contrast  Result Date: 04/30/2017 CLINICAL DATA:  Recent diagnosis of pancreatic cancer. The patient received the first round of chemotherapy. On lab work, LFTs and bilirubin were elevated. EXAM: CT ABDOMEN AND PELVIS WITH CONTRAST TECHNIQUE: Multidetector CT imaging of the abdomen and pelvis was performed using the standard protocol following bolus administration of intravenous contrast. CONTRAST:  12mL ISOVUE-300 IOPAMIDOL (ISOVUE-300) INJECTION 61% COMPARISON:  April 01, 2017 and March 23, 2017 FINDINGS: Lower chest: Scarring is seen in the left base and anterior right base, unchanged. No metastatic lesions or evidence of pneumonia. The lung bases are otherwise unremarkable. Hepatobiliary: A biliary stent has been placed in the interval extending from central hepatic duct, through the common bile duct, into the region of the inferior pancreatic head near the junction with the duodenum. There is significant dilatation of the intra and extrahepatic bile ducts which is actually increased since March 23, 2017 despite the stent. The common bile duct on image 25 measures at least 14 mm. The gallbladder is mildly distended. Air in the intrahepatic bile ducts is consistent with a stent. The portal vein remains patent within the porta hepatis and liver. It is narrowed by the known malignancy as seen on series 2, image 24, similar in the interval. No liver masses are seen. Pancreas: The patient's known pancreatic malignancy is again identified in the neck measuring 3.1 by 2.2 cm on today's study. The main portal vein is narrowed as above. Neither the celiac or SMA trunks are encased. The SMV it appears remain patent  as well. There is pancreatic dilatation more distally in the pancreas. There is likely dilatation of a pancreatic side duct on image 19. Spleen: Normal in size without focal abnormality. Adrenals/Urinary Tract: The adrenal glands are normal. There is a probable right renal cyst too small to characterize. The kidneys are otherwise normal as is the bladder. No ureteral stones identified. Stomach/Bowel: The stomach and small bowel are unremarkable with no obstruction. The colon and visualized appendix are normal. Vascular/Lymphatic: Mild atherosclerotic changes in the abdominal aorta. No discrete adenopathy identified. Reproductive: Prostate is unremarkable. Other: No abdominal wall hernia or abnormality. No abdominopelvic ascites. Musculoskeletal: No acute or significant osseous findings. IMPRESSION: 1. The biliary stent extends from a hepatic branch of the biliary tree, through the common bile duct, into the pancreatic head. It does not appear that the stent actually exits the pancreatic head on today's study but it is close to the junction of the pancreatic head and duodenum. Despite the stent, there is increasing intra and extrahepatic biliary duct dilatation which explains the patient's symptoms. 2. Changes of pancreatic carcinoma as described above. The carcinoma narrows the main portal vein. 3. Mild atherosclerotic change in the abdominal aorta. Electronically Signed   By: Dorise Bullion III M.D   On: 04/30/2017 01:33   Dg C-arm 1-60 Min-no Report  Result Date: 05/06/2017 Fluoroscopy was utilized by the requesting physician.  No radiographic interpretation.    ASSESSMENT & PLAN:   Malignant tumor of body of pancreas ([place]) # Pancreatic adenocarcinoma-borderline resectable; status post 2 cycle of neoadjuvant FOLFIRINOX; tolerated better   # proceed with cycle #3 of FOLFIRINOX today- Labs today reviewed;  acceptable for treatment today except mild elevated LFTs. Will get CT scan after 4 cycles; will  order CT next vist.  Awaiting surgical evaluation at [facility] tomorrow.  #Elevated LFTs -biliary/pancreatic ductal obstruction status post ERCP- bilirubin 2.5; improving- but- continue iri to 120 mg/m2.   # Genetic testing-  s/p genetic counselling; s/p blood draw.     # Pain control-better controlled; continue fentanyl patch 25 g and also Percocet.  Better controlled.  #Labs 1 week; possible fluids; follow-up in 2 weeks labs; FOLFIRINOX; onprod-day-2/pumpf off- [place].   All questions were answered. The patient knows to call the clinic with any problems, questions or concerns.    Cammie Sickle, MD 05/25/2017 3:50 PM

## 2017-05-26 ENCOUNTER — Encounter: Payer: Self-pay | Admitting: Genetic Counselor

## 2017-05-26 ENCOUNTER — Ambulatory Visit: Payer: No Typology Code available for payment source | Admitting: Internal Medicine

## 2017-05-26 ENCOUNTER — Ambulatory Visit: Payer: No Typology Code available for payment source

## 2017-05-26 ENCOUNTER — Other Ambulatory Visit: Payer: No Typology Code available for payment source

## 2017-05-26 ENCOUNTER — Telehealth: Payer: Self-pay | Admitting: Genetic Counselor

## 2017-05-26 NOTE — Telephone Encounter (Signed)
Cancer Genetics             Telegenetics Results Disclosure   Patient Name: Nathaniel Saunders Patient DOB: 1970-12-03 Patient Age: 47 y.o. Phone Call Date: 05/26/2017  Referring Provider: Charlaine Dalton, MD    Mr. Skoda was called today to discuss genetic test results. Please see the Genetics telephone note from 05/20/17 for a detailed discussion of his personal and family histories and the recommendations provided.  Genetic Testing: At the time of Mr. Corkum' telegenetics visit, he decided to pursue genetic testing of multiple genes associated with hereditary susceptibility to cancer. Testing included sequencing and deletion/duplication analysis. Testing did not reveal any pathogenic mutation in any of these genes.  A copy of the genetic test report will be scanned into Epic under the media tab.  The genes analyzed were the 83 genes on [facility]'s Multi-Cancer panel (ALK, APC, ATM, AXIN2, BAP1, BARD1, BLM, BMPR1A, BRCA1, BRCA2, BRIP1, CASR, CDC73, CDH1, CDK4, CDKN1B, CDKN1C, CDKN2A, CEBPA, CHEK2, CTNNA1, DICER1, DIS3L2, EGFR, EPCAM, FH, FLCN, GATA2, GPC3, GREM1, HOXB13, HRAS, KIT, MAX, MEN1, MET, MITF, MLH1, MSH2, MSH3, MSH6, MUTYH, NBN, NF1, NF2, NTHL1, PALB2, PDGFRA, PHOX2B, PMS2, POLD1, POLE, POT1, PRKAR1A, PTCH1, PTEN, RAD50, RAD51C, RAD51D, RB1, RECQL4, RET, RUNX1, SDHA, SDHAF2, SDHB, SDHC, SDHD, SMAD4, SMARCA4, SMARCB1, SMARCE1, STK11, SUFU, TERC, TERT, TMEM127, TP53, TSC1, TSC2, VHL, WRN, WT1).  Since the current test is not perfect, it is possible that there may be a gene mutation that current testing cannot detect, but that chance is small. It is possible that a different genetic factor, which has not yet been discovered or is not on this panel, is responsible for the cancer diagnoses in the family. Again, the likelihood of this is low. No additional testing is recommended at this time for Mr. Dolinsky.  Four Variants of Uncertain Significance were  detected: BARD1 c.1009A>G (p.Arg337Gly), [name], [name], and PALB2 c.1655A>G (p.Gln552Arg. This is still considered a normal result. While at this time, it is unknown if this finding is associated with increased cancer risk, the majority of these variants get reclassified to be inconsequential. We emphasized that medical management should not be based on this finding. With time, we suspect the lab will determine the significance, if any. If we do learn more about it, we will try to contact Mr. Wymore to discuss it further. It is important to stay in touch with [place] periodically and keep the address and phone number up to date.  Cancer Screening: Cancer screening guidelines will be deferred to his physician.   Family Members: Family members are at some increased risk of developing cancer, over the general population risk, simply due to the family history. Family members are recommended to speak with their own providers about appropriate cancer screenings.  Any relative who had cancer at a young age or had a particularly rare cancer may also wish to pursue genetic testing. Genetic counselors can be located in other cities, by visiting the website of the [employer] ([employer]) and Field seismologist for a Dietitian by zip code.   Family members are not recommended to get tested for the above VUSs outside of a research protocol as this finding has no implications for their medical management.    Lastly, cancer genetics is a rapidly advancing field and it is possible that new genetic tests will be appropriate for  Mr. Maisano in the future. We encourage Mr. Gullikson to remain in contact with Genetics on an annual basis so his personal and family histories can be updated.    [name], MS, [place] Certified Genetic Counselor phone: [phone]

## 2017-05-27 ENCOUNTER — Inpatient Hospital Stay: Payer: Medicaid Other

## 2017-05-27 VITALS — BP 113/73 | HR 68 | Temp 97.0°F | Resp 20

## 2017-05-27 DIAGNOSIS — Z5111 Encounter for antineoplastic chemotherapy: Secondary | ICD-10-CM | POA: Diagnosis not present

## 2017-05-27 DIAGNOSIS — C251 Malignant neoplasm of body of pancreas: Secondary | ICD-10-CM

## 2017-05-27 MED ORDER — SODIUM CHLORIDE 0.9% FLUSH
10.0000 mL | INTRAVENOUS | Status: DC | PRN
  Administered 2017-05-27: 10 mL
  Filled 2017-05-27: qty 10

## 2017-05-27 MED ORDER — PEGFILGRASTIM 6 MG/0.6ML ~~LOC~~ PSKT
6.0000 mg | PREFILLED_SYRINGE | Freq: Once | SUBCUTANEOUS | Status: AC
  Administered 2017-05-27: 6 mg via SUBCUTANEOUS

## 2017-05-27 MED ORDER — HEPARIN SOD (PORK) LOCK FLUSH 100 UNIT/ML IV SOLN
500.0000 [IU] | Freq: Once | INTRAVENOUS | Status: AC | PRN
  Administered 2017-05-27: 500 [IU]

## 2017-06-01 ENCOUNTER — Inpatient Hospital Stay: Payer: Medicaid Other

## 2017-06-01 VITALS — BP 115/70 | HR 81 | Temp 98.1°F | Resp 19

## 2017-06-01 DIAGNOSIS — C251 Malignant neoplasm of body of pancreas: Secondary | ICD-10-CM

## 2017-06-01 DIAGNOSIS — Z5111 Encounter for antineoplastic chemotherapy: Secondary | ICD-10-CM | POA: Diagnosis not present

## 2017-06-01 LAB — CBC WITH DIFFERENTIAL/PLATELET
Basophils Absolute: 0.1 10*3/uL (ref 0–0.1)
Basophils Relative: 0 %
Eosinophils Absolute: 0.1 10*3/uL (ref 0–0.7)
Eosinophils Relative: 0 %
HEMATOCRIT: 34.3 % — AB (ref 40.0–52.0)
Hemoglobin: 11.6 g/dL — ABNORMAL LOW (ref 13.0–18.0)
LYMPHS ABS: 2.1 10*3/uL (ref 1.0–3.6)
LYMPHS PCT: 8 %
MCH: 31.1 pg (ref 26.0–34.0)
MCHC: 33.8 g/dL (ref 32.0–36.0)
MCV: 91.9 fL (ref 80.0–100.0)
MONO ABS: 1.8 10*3/uL — AB (ref 0.2–1.0)
MONOS PCT: 7 %
NEUTROS ABS: 21.8 10*3/uL — AB (ref 1.4–6.5)
Neutrophils Relative %: 85 %
PLATELETS: 249 10*3/uL (ref 150–440)
RBC: 3.73 MIL/uL — ABNORMAL LOW (ref 4.40–5.90)
RDW: 17.3 % — ABNORMAL HIGH (ref 11.5–14.5)
WBC: 25.9 10*3/uL — ABNORMAL HIGH (ref 3.8–10.6)

## 2017-06-01 LAB — COMPREHENSIVE METABOLIC PANEL
ALBUMIN: 3.7 g/dL (ref 3.5–5.0)
ALT: 30 U/L (ref 17–63)
AST: 23 U/L (ref 15–41)
Alkaline Phosphatase: 207 U/L — ABNORMAL HIGH (ref 38–126)
Anion gap: 9 (ref 5–15)
BILIRUBIN TOTAL: 1.5 mg/dL — AB (ref 0.3–1.2)
BUN: 9 mg/dL (ref 6–20)
CHLORIDE: 103 mmol/L (ref 101–111)
CO2: 23 mmol/L (ref 22–32)
Calcium: 9 mg/dL (ref 8.9–10.3)
Creatinine, Ser: 0.71 mg/dL (ref 0.61–1.24)
GFR calc Af Amer: >60 mL/min (ref >60)
GFR calc non Af Amer: >60 mL/min (ref >60)
GLUCOSE: 109 mg/dL — AB (ref 65–99)
POTASSIUM: 3.2 mmol/L — AB (ref 3.5–5.1)
SODIUM: 135 mmol/L (ref 135–145)
Total Protein: 7.4 g/dL (ref 6.5–8.1)

## 2017-06-01 MED ORDER — HEPARIN SOD (PORK) LOCK FLUSH 100 UNIT/ML IV SOLN
500.0000 [IU] | Freq: Once | INTRAVENOUS | Status: AC
  Administered 2017-06-01: 500 [IU] via INTRAVENOUS
  Filled 2017-06-01: qty 5

## 2017-06-01 NOTE — Progress Notes (Signed)
Per Dr. Rogue Bussing no IVFs needed today, pt agrees with plan. Pt stable at discharge.

## 2017-06-06 ENCOUNTER — Encounter: Payer: Self-pay | Admitting: Internal Medicine

## 2017-06-08 ENCOUNTER — Ambulatory Visit: Payer: No Typology Code available for payment source

## 2017-06-08 ENCOUNTER — Ambulatory Visit: Payer: No Typology Code available for payment source | Admitting: Internal Medicine

## 2017-06-08 ENCOUNTER — Other Ambulatory Visit: Payer: No Typology Code available for payment source

## 2017-06-15 ENCOUNTER — Inpatient Hospital Stay (HOSPITAL_BASED_OUTPATIENT_CLINIC_OR_DEPARTMENT_OTHER): Payer: Medicaid Other | Admitting: Internal Medicine

## 2017-06-15 ENCOUNTER — Inpatient Hospital Stay: Payer: Medicaid Other

## 2017-06-15 ENCOUNTER — Other Ambulatory Visit: Payer: Self-pay

## 2017-06-15 ENCOUNTER — Encounter: Payer: Self-pay | Admitting: Internal Medicine

## 2017-06-15 VITALS — BP 107/72 | HR 76 | Temp 97.0°F | Resp 20 | Wt 137.8 lb

## 2017-06-15 DIAGNOSIS — R948 Abnormal results of function studies of other organs and systems: Secondary | ICD-10-CM

## 2017-06-15 DIAGNOSIS — C251 Malignant neoplasm of body of pancreas: Secondary | ICD-10-CM

## 2017-06-15 DIAGNOSIS — I700 Atherosclerosis of aorta: Secondary | ICD-10-CM

## 2017-06-15 DIAGNOSIS — Z5111 Encounter for antineoplastic chemotherapy: Secondary | ICD-10-CM

## 2017-06-15 DIAGNOSIS — J8410 Pulmonary fibrosis, unspecified: Secondary | ICD-10-CM

## 2017-06-15 DIAGNOSIS — G893 Neoplasm related pain (acute) (chronic): Secondary | ICD-10-CM

## 2017-06-15 DIAGNOSIS — Z79899 Other long term (current) drug therapy: Secondary | ICD-10-CM

## 2017-06-15 DIAGNOSIS — Z87891 Personal history of nicotine dependence: Secondary | ICD-10-CM

## 2017-06-15 DIAGNOSIS — J45909 Unspecified asthma, uncomplicated: Secondary | ICD-10-CM

## 2017-06-15 DIAGNOSIS — Z7689 Persons encountering health services in other specified circumstances: Secondary | ICD-10-CM

## 2017-06-15 DIAGNOSIS — Z800 Family history of malignant neoplasm of digestive organs: Secondary | ICD-10-CM

## 2017-06-15 LAB — COMPREHENSIVE METABOLIC PANEL
ALT: 20 U/L (ref 17–63)
ANION GAP: 3 — AB (ref 5–15)
AST: 24 U/L (ref 15–41)
Albumin: 3.7 g/dL (ref 3.5–5.0)
Alkaline Phosphatase: 90 U/L (ref 38–126)
BILIRUBIN TOTAL: 1.5 mg/dL — AB (ref 0.3–1.2)
BUN: 8 mg/dL (ref 6–20)
CHLORIDE: 107 mmol/L (ref 101–111)
CO2: 26 mmol/L (ref 22–32)
Calcium: 8.8 mg/dL — ABNORMAL LOW (ref 8.9–10.3)
Creatinine, Ser: 0.78 mg/dL (ref 0.61–1.24)
GFR calc Af Amer: >60 mL/min (ref >60)
Glucose, Bld: 101 mg/dL — ABNORMAL HIGH (ref 65–99)
POTASSIUM: 3.9 mmol/L (ref 3.5–5.1)
Sodium: 136 mmol/L (ref 135–145)
Total Protein: 7.2 g/dL (ref 6.5–8.1)

## 2017-06-15 LAB — CBC WITH DIFFERENTIAL/PLATELET
BASOS ABS: 0 10*3/uL (ref 0–0.1)
Basophils Relative: 1 %
Eosinophils Absolute: 0.2 10*3/uL (ref 0–0.7)
Eosinophils Relative: 2 %
HEMATOCRIT: 38.5 % — AB (ref 40.0–52.0)
HEMOGLOBIN: 13.2 g/dL (ref 13.0–18.0)
LYMPHS PCT: 24 %
Lymphs Abs: 1.6 10*3/uL (ref 1.0–3.6)
MCH: 32.2 pg (ref 26.0–34.0)
MCHC: 34.2 g/dL (ref 32.0–36.0)
MCV: 94.2 fL (ref 80.0–100.0)
Monocytes Absolute: 1 10*3/uL (ref 0.2–1.0)
Monocytes Relative: 14 %
NEUTROS ABS: 4.1 10*3/uL (ref 1.4–6.5)
NEUTROS PCT: 59 %
PLATELETS: 325 10*3/uL (ref 150–440)
RBC: 4.09 MIL/uL — AB (ref 4.40–5.90)
RDW: 17.9 % — ABNORMAL HIGH (ref 11.5–14.5)
WBC: 6.9 10*3/uL (ref 3.8–10.6)

## 2017-06-15 MED ORDER — ATROPINE SULFATE 1 MG/ML IJ SOLN
0.5000 mg | Freq: Once | INTRAMUSCULAR | Status: AC | PRN
  Administered 2017-06-15: 0.5 mg via INTRAVENOUS
  Filled 2017-06-15: qty 1

## 2017-06-15 MED ORDER — SODIUM CHLORIDE 0.9 % IV SOLN
2400.0000 mg/m2 | INTRAVENOUS | Status: DC
  Administered 2017-06-15: 4050 mg via INTRAVENOUS
  Filled 2017-06-15: qty 81

## 2017-06-15 MED ORDER — OXALIPLATIN CHEMO INJECTION 100 MG/20ML
150.0000 mg | Freq: Once | INTRAVENOUS | Status: AC
  Administered 2017-06-15: 150 mg via INTRAVENOUS
  Filled 2017-06-15: qty 20

## 2017-06-15 MED ORDER — LEUCOVORIN CALCIUM INJECTION 350 MG
700.0000 mg | Freq: Once | INTRAVENOUS | Status: AC
  Administered 2017-06-15: 700 mg via INTRAVENOUS
  Filled 2017-06-15: qty 35

## 2017-06-15 MED ORDER — IRINOTECAN HCL CHEMO INJECTION 100 MG/5ML
120.0000 mg/m2 | Freq: Once | INTRAVENOUS | Status: AC
  Administered 2017-06-15: 200 mg via INTRAVENOUS
  Filled 2017-06-15: qty 10

## 2017-06-15 MED ORDER — DEXAMETHASONE SODIUM PHOSPHATE 10 MG/ML IJ SOLN
10.0000 mg | Freq: Once | INTRAMUSCULAR | Status: AC
  Administered 2017-06-15: 10 mg via INTRAVENOUS
  Filled 2017-06-15 (×2): qty 1

## 2017-06-15 MED ORDER — PALONOSETRON HCL INJECTION 0.25 MG/5ML
0.2500 mg | Freq: Once | INTRAVENOUS | Status: AC
  Administered 2017-06-15: 0.25 mg via INTRAVENOUS
  Filled 2017-06-15: qty 5

## 2017-06-15 MED ORDER — DEXTROSE 5 % IV SOLN
Freq: Once | INTRAVENOUS | Status: AC
  Administered 2017-06-15: 11:00:00 via INTRAVENOUS
  Filled 2017-06-15: qty 1000

## 2017-06-15 MED ORDER — HEPARIN SOD (PORK) LOCK FLUSH 100 UNIT/ML IV SOLN
500.0000 [IU] | Freq: Once | INTRAVENOUS | Status: AC
  Administered 2017-06-15: 500 [IU] via INTRAVENOUS

## 2017-06-15 MED ORDER — SODIUM CHLORIDE 0.9% FLUSH
10.0000 mL | INTRAVENOUS | Status: AC | PRN
  Administered 2017-06-15: 10 mL via INTRAVENOUS
  Filled 2017-06-15: qty 10

## 2017-06-15 NOTE — Progress Notes (Signed)
Pt here for f/u for pancreatic cancer and chemotherapy. Patient states that he has no medical concerns. He denies any NVD or constipation. He denies any abdominal pain today.

## 2017-06-15 NOTE — Progress Notes (Signed)
[place] CONSULT NOTE  Patient Care Team: Maeola Sarah, [name], RN as Registered Nurse  CHIEF COMPLAINTS/PURPOSE OF CONSULTATION: Pancreatic mass  #  Oncology History   # November 2018- PANCREATIC ADENOCARCINOMA pancreatic neck mass [~2.5cm];  [name]; [name]; No mets  # Dec 4th 2018-FOLFIRINOX [neo-adj chemo]   # Nov 2018-biliary obstruction status post ERCP; [name]; [name]; [name]; [name], [name], [name], and PALB2 c.1655A>G (p.Gln552Arg. This is still considered a normal result.         Malignant tumor of body of pancreas (Nathaniel Saunders)   03/24/2017 Initial Diagnosis    Malignant tumor of body of pancreas (HCC)        HISTORY OF PRESENTING ILLNESS:  Nathaniel Saunders 47 y.o.  male with borderline resectable adenocarcinoma the pancreas-stage II is currently on neoadjuvant chemotherapy-FOLFIRINOX status post cycle #3 approximately 2 weeks ago.   Denies any worsening abdominal pain.  His pain is stable on the fentanyl patch; breakthrough pain medication.  Now patient feels much better his appetite is improving.  He is gaining weight. No skin rash.  Denies any nausea vomiting diarrhea.  Denies any oral ulcers.  ROS: A complete 10 point review of system is done which is negative except mentioned above in history of present illness  MEDICAL HISTORY:  Asthma [well controlled] Past Medical History:  Diagnosis Date  . Asthma   . Genetic testing 05/20/2017   Multi-Cancer panel (83 genes) @ [facility] - No pathogenic mutations detected  . Pancreatic mass   .  Pancreatic mass     SURGICAL HISTORY: Past Surgical History:  Procedure Laterality Date  . ERCP N/A 04/21/2017   Procedure: ENDOSCOPIC RETROGRADE CHOLANGIOPANCREATOGRAPHY (ERCP);  Surgeon: Lucilla Lame, MD;  Location: [facility] ENDOSCOPY;  Service: Endoscopy;  Laterality: N/A;  . ERCP N/A 05/06/2017   Procedure: ENDOSCOPIC RETROGRADE CHOLANGIOPANCREATOGRAPHY (ERCP);  Surgeon: Lucilla Lame, MD;  Location: [facility] ENDOSCOPY;  Service: Endoscopy;  Laterality: N/A;  . PORTA CATH INSERTION N/A 04/18/2017   Procedure: PORTA CATH INSERTION;  Surgeon: Algernon Huxley, MD;  Location: [facility];  Service: Cardiovascular;  Laterality: N/A;    SOCIAL HISTORY:  he lives in [place]; sister/girl friend; he has children aged  3/18/ 6 years;  quit smkoing - 5 months; alcohol- none; truck driver. Social History   Socioeconomic History  . Marital status: Single    Spouse name: Not on file  . Number of children: Not on file  . Years of education: Not on file  . Highest education level: Not on file  Social Needs  . Financial resource strain: Not hard at all  . Food insecurity - worry: Never true  . Food insecurity - inability: Never true  . Transportation needs - medical: No  . Transportation needs - non-medical: No  Occupational History  . Not on file  Tobacco Use  . Smoking status: Former Smoker    Packs/day: 0.25    Years: 5.00    Pack years: 1.25    Types: Cigarettes    Last attempt to quit: 05/07/2015    Years since quitting: 2.1  . Smokeless tobacco: Never Used  Substance and Sexual Activity  .  Alcohol use: No  . Drug use: No  . Sexual activity: Yes    Partners: Male  Other Topics Concern  . Not on file  Social History Narrative  . Not on file    FAMILY HISTORY: 2 daughters [23 and 60]; boy- 41 years; one sister- 38 years. diagnosis of pancreatic cancer in mother; grandmother the patient's young age.  Family History  Problem Relation Age of Onset  . Pancreatic cancer Mother  76       deceased 11  . Diabetes Father   . Colon cancer Maternal Grandmother 39       deceased 69    ALLERGIES:  has No Known Allergies.  MEDICATIONS:  Current Outpatient Medications  Medication Sig Dispense Refill  . albuterol (PROVENTIL HFA;VENTOLIN HFA) 108 (90 Base) MCG/ACT inhaler Inhale 1-2 puffs every 6 (six) hours as needed into the lungs for wheezing or shortness of breath.    . lidocaine-prilocaine (EMLA) cream Apply 1 application topically as needed. Apply generously over the Mediport 45 minutes prior to chemotherapy. 30 g 0  . oxyCODONE-acetaminophen (PERCOCET/ROXICET) 5-325 MG tablet Take 1 tablet by mouth every 6 (six) hours as needed. 30 tablet 0  . potassium chloride 20 MEQ TBCR Take 10 mEq by mouth daily for 14 days. 14 tablet 0  . loperamide (IMODIUM) 2 MG capsule Take 1 capsule (2 mg total) by mouth as needed for diarrhea or loose stools. (Patient not taking: Reported on 05/25/2017) 30 capsule 0  . ondansetron (ZOFRAN ODT) 4 MG disintegrating tablet Take 1 tablet (4 mg total) by mouth every 8 (eight) hours as needed for nausea or vomiting. (Patient not taking: Reported on 05/25/2017) 20 tablet 0  . ondansetron (ZOFRAN) 8 MG tablet One pill every 8 hours as needed for nausea/vomitting. (Patient not taking: Reported on 05/25/2017) 40 tablet 1  . prochlorperazine (COMPAZINE) 10 MG tablet Take 1 tablet (10 mg total) by mouth every 6 (six) hours as needed for nausea or vomiting. (Patient not taking: Reported on 06/15/2017) 30 tablet 0  . promethazine (PHENERGAN) 12.5 MG tablet Take 1 tablet (12.5 mg total) by mouth every 8 (eight) hours as needed for nausea or vomiting. (Patient not taking: Reported on 06/15/2017) 10 tablet 0  . promethazine (PHENERGAN) 25 MG suppository Place 1 suppository (25 mg total) rectally every 8 (eight) hours as needed for nausea or vomiting. (Patient not taking: Reported on 06/15/2017) 12 each 0   No current facility-administered medications for this visit.     Facility-Administered Medications Ordered in Other Visits  Medication Dose Route Frequency Provider Last Rate Last Dose  . sodium chloride flush (NS) 0.9 % injection 10 mL  10 mL Intravenous PRN Cammie Sickle, MD   10 mL at 06/15/17 0941      .  PHYSICAL EXAMINATION: ECOG PERFORMANCE STATUS: 1 - Symptomatic but completely ambulatory  Vitals:   06/15/17 1015  BP: 107/72  Pulse: 76  Resp: 20  Temp: (!) 97 F (36.1 C)   Filed Weights   06/15/17 1020  Weight: 137 lb 12.8 oz (62.5 kg)    GENERAL: Well-nourished well-developed; Alert, no distress and comfortable.   Alone. EYES: no pallor or icterus OROPHARYNX: no thrush or ulceration; poor dentition  NECK: supple, no masses felt LYMPH:  no palpable lymphadenopathy in the cervical, axillary or inguinal regions LUNGS: clear to auscultation and  No wheeze or crackles HEART/CVS: regular rate & rhythm and no murmurs; No lower extremity edema ABDOMEN: abdomen soft, non-tender and normal  bowel sounds Musculoskeletal:no cyanosis of digits and no clubbing  PSYCH: alert & oriented x 3 with fluent speech NEURO: no focal motor/sensory deficits SKIN:  no rashes or significant lesions  LABORATORY DATA:  I have reviewed the data as listed Lab Results  Component Value Date   WBC 6.9 06/15/2017   HGB 13.2 06/15/2017   HCT 38.5 (L) 06/15/2017   MCV 94.2 06/15/2017   PLT 325 06/15/2017   Recent Labs    03/24/17 1537  04/22/17 0443 04/26/17 0840  05/25/17 0842 06/01/17 1311 06/15/17 0948  NA  --    < > 137 136   < > 141 135 136  K  --    < > 3.8 3.3*   < > 3.7 3.2* 3.9  CL  --    < > 109 103   < > 108 103 107  CO2  --    < > 22 27   < > 27 23 26   GLUCOSE  --    < > 94 158*   < > 126* 109* 101*  BUN  --    < > 8 10   < > 9 9 8   CREATININE  --    < > 0.64 0.74   < > 0.78 0.71 0.78  CALCIUM  --    < > 8.7* 8.9   < > 8.8* 9.0 8.8*  GFRNONAA  --    < > >60 >60   < > >60 >60 >60  GFRAA  --    < > >60 >60   < > >60 >60 >60   PROT 8.0   < > 6.5 7.7   < > 6.8 7.4 7.2  ALBUMIN 4.2   < > 2.9* 3.6   < > 3.2* 3.7 3.7  AST 21   < > 198* 64*   < > 34 23 24  ALT 13*   < > 512* 237*   < > 48 30 20  ALKPHOS 54   < > 417* 315*   < > 216* 207* 90  BILITOT 0.6   < > 6.5* 3.7*   < > 1.4* 1.5* 1.5*  BILIDIR 0.1  --  3.0* 1.8*  --   --   --   --   IBILI 0.5  --  3.5* 1.9*  --   --   --   --    < > = values in this interval not displayed.    RADIOGRAPHIC STUDIES: I have personally reviewed the radiological images as listed and agreed with the findings in the report. No results found.  ASSESSMENT & PLAN:   Malignant tumor of body of pancreas (Nathaniel Saunders) # Pancreatic adenocarcinoma-borderline resectable; status post 3 cycle of neoadjuvant FOLFIRINOX; tolerated better   # proceed with cycle #4 of FOLFIRINOX today- Labs today reviewed;  acceptable for treatment today except mild elevated LFTs. Will proceed with CT prior to next treatment.   #Elevated LFTs -biliary/pancreatic ductal obstruction status post ERCP- bilirubin 1.5 improving- but- continue iri to 120 mg/m2.    # Pain control-better controlled; continue fentanyl patch 25 g and also Percocet.  Better controlled.  # follow up in 2 weeks; labs- CT prior A/P; FOLFIRINOX; pump off 2 days.   All questions were answered. The patient knows to call the clinic with any problems, questions or concerns.    Cammie Sickle, MD 06/21/2017 6:49 PM

## 2017-06-15 NOTE — Assessment & Plan Note (Addendum)
#   Pancreatic adenocarcinoma-borderline resectable; status post 3 cycle of neoadjuvant FOLFIRINOX; tolerated better   # proceed with cycle #4 of FOLFIRINOX today- Labs today reviewed;  acceptable for treatment today except mild elevated LFTs. Will proceed with CT prior to next treatment.   #Elevated LFTs -biliary/pancreatic ductal obstruction status post ERCP- bilirubin 1.5 improving- but- continue iri to 120 mg/m2.    # Pain control-better controlled; continue fentanyl patch 25 g and also Percocet.  Better controlled.  # follow up in 2 weeks; labs- CT prior A/P; FOLFIRINOX; pump off 2 days.

## 2017-06-17 ENCOUNTER — Ambulatory Visit: Payer: No Typology Code available for payment source | Admitting: Internal Medicine

## 2017-06-17 ENCOUNTER — Inpatient Hospital Stay: Payer: Medicaid Other

## 2017-06-17 ENCOUNTER — Other Ambulatory Visit: Payer: No Typology Code available for payment source

## 2017-06-17 ENCOUNTER — Ambulatory Visit: Payer: No Typology Code available for payment source

## 2017-06-17 VITALS — BP 121/79 | HR 94 | Temp 97.0°F | Resp 20

## 2017-06-17 DIAGNOSIS — Z5111 Encounter for antineoplastic chemotherapy: Secondary | ICD-10-CM | POA: Diagnosis not present

## 2017-06-17 DIAGNOSIS — C251 Malignant neoplasm of body of pancreas: Secondary | ICD-10-CM

## 2017-06-17 MED ORDER — SODIUM CHLORIDE 0.9% FLUSH
10.0000 mL | INTRAVENOUS | Status: DC | PRN
  Administered 2017-06-17: 10 mL
  Filled 2017-06-17: qty 10

## 2017-06-17 MED ORDER — PEGFILGRASTIM 6 MG/0.6ML ~~LOC~~ PSKT
6.0000 mg | PREFILLED_SYRINGE | Freq: Once | SUBCUTANEOUS | Status: AC
  Administered 2017-06-17: 6 mg via SUBCUTANEOUS

## 2017-06-17 MED ORDER — HEPARIN SOD (PORK) LOCK FLUSH 100 UNIT/ML IV SOLN
500.0000 [IU] | Freq: Once | INTRAVENOUS | Status: AC | PRN
  Administered 2017-06-17: 500 [IU]

## 2017-06-27 ENCOUNTER — Other Ambulatory Visit: Payer: Self-pay | Admitting: Internal Medicine

## 2017-06-27 ENCOUNTER — Ambulatory Visit: Admission: RE | Admit: 2017-06-27 | Payer: No Typology Code available for payment source | Source: Ambulatory Visit

## 2017-06-27 ENCOUNTER — Ambulatory Visit
Admission: RE | Admit: 2017-06-27 | Discharge: 2017-06-27 | Disposition: A | Discharge status: Home / Self Care | Payer: Medicaid Other | Source: Ambulatory Visit | Attending: Internal Medicine | Admitting: Internal Medicine

## 2017-06-27 DIAGNOSIS — K862 Cyst of pancreas: Secondary | ICD-10-CM | POA: Insufficient documentation

## 2017-06-27 DIAGNOSIS — I700 Atherosclerosis of aorta: Secondary | ICD-10-CM | POA: Diagnosis not present

## 2017-06-27 DIAGNOSIS — C251 Malignant neoplasm of body of pancreas: Secondary | ICD-10-CM | POA: Diagnosis present

## 2017-06-27 DIAGNOSIS — Z9689 Presence of other specified functional implants: Secondary | ICD-10-CM | POA: Insufficient documentation

## 2017-06-27 MED ORDER — IOPAMIDOL (ISOVUE-370) INJECTION 76%
100.0000 mL | Freq: Once | INTRAVENOUS | Status: AC | PRN
  Administered 2017-06-27: 100 mL via INTRAVENOUS

## 2017-06-29 ENCOUNTER — Inpatient Hospital Stay: Payer: Medicaid Other | Attending: Internal Medicine

## 2017-06-29 ENCOUNTER — Inpatient Hospital Stay (HOSPITAL_BASED_OUTPATIENT_CLINIC_OR_DEPARTMENT_OTHER): Payer: Medicaid Other | Admitting: Internal Medicine

## 2017-06-29 ENCOUNTER — Inpatient Hospital Stay: Payer: Medicaid Other

## 2017-06-29 ENCOUNTER — Telehealth: Payer: Self-pay | Admitting: Internal Medicine

## 2017-06-29 ENCOUNTER — Encounter: Payer: Self-pay | Admitting: Internal Medicine

## 2017-06-29 VITALS — BP 123/86 | HR 79 | Temp 97.8°F | Wt 149.8 lb

## 2017-06-29 DIAGNOSIS — Z5111 Encounter for antineoplastic chemotherapy: Secondary | ICD-10-CM

## 2017-06-29 DIAGNOSIS — Z79899 Other long term (current) drug therapy: Secondary | ICD-10-CM | POA: Diagnosis not present

## 2017-06-29 DIAGNOSIS — K8681 Exocrine pancreatic insufficiency: Secondary | ICD-10-CM | POA: Insufficient documentation

## 2017-06-29 DIAGNOSIS — C251 Malignant neoplasm of body of pancreas: Secondary | ICD-10-CM

## 2017-06-29 DIAGNOSIS — N281 Cyst of kidney, acquired: Secondary | ICD-10-CM | POA: Insufficient documentation

## 2017-06-29 DIAGNOSIS — Z87891 Personal history of nicotine dependence: Secondary | ICD-10-CM | POA: Diagnosis not present

## 2017-06-29 DIAGNOSIS — I700 Atherosclerosis of aorta: Secondary | ICD-10-CM | POA: Insufficient documentation

## 2017-06-29 DIAGNOSIS — Z7689 Persons encountering health services in other specified circumstances: Secondary | ICD-10-CM | POA: Diagnosis not present

## 2017-06-29 LAB — CBC WITH DIFFERENTIAL/PLATELET
Basophils Absolute: 0.1 10*3/uL (ref 0–0.1)
Basophils Relative: 0 %
EOS ABS: 0.2 10*3/uL (ref 0–0.7)
Eosinophils Relative: 1 %
HCT: 36.5 % — ABNORMAL LOW (ref 40.0–52.0)
Hemoglobin: 12.2 g/dL — ABNORMAL LOW (ref 13.0–18.0)
LYMPHS ABS: 3.4 10*3/uL (ref 1.0–3.6)
Lymphocytes Relative: 22 %
MCH: 31.9 pg (ref 26.0–34.0)
MCHC: 33.5 g/dL (ref 32.0–36.0)
MCV: 95.1 fL (ref 80.0–100.0)
MONOS PCT: 5 %
Monocytes Absolute: 0.8 10*3/uL (ref 0.2–1.0)
Neutro Abs: 11.2 10*3/uL — ABNORMAL HIGH (ref 1.4–6.5)
Neutrophils Relative %: 72 %
PLATELETS: 271 10*3/uL (ref 150–440)
RBC: 3.84 MIL/uL — ABNORMAL LOW (ref 4.40–5.90)
RDW: 17.2 % — ABNORMAL HIGH (ref 11.5–14.5)
WBC: 15.5 10*3/uL — AB (ref 3.8–10.6)

## 2017-06-29 LAB — COMPREHENSIVE METABOLIC PANEL
ALK PHOS: 113 U/L (ref 38–126)
ALT: 15 U/L — AB (ref 17–63)
AST: 21 U/L (ref 15–41)
Albumin: 3.4 g/dL — ABNORMAL LOW (ref 3.5–5.0)
Anion gap: 7 (ref 5–15)
BUN: 9 mg/dL (ref 6–20)
CHLORIDE: 105 mmol/L (ref 101–111)
CO2: 27 mmol/L (ref 22–32)
CREATININE: 0.69 mg/dL (ref 0.61–1.24)
Calcium: 8.7 mg/dL — ABNORMAL LOW (ref 8.9–10.3)
GFR calc Af Amer: >60 mL/min (ref >60)
Glucose, Bld: 150 mg/dL — ABNORMAL HIGH (ref 65–99)
Potassium: 3.7 mmol/L (ref 3.5–5.1)
Sodium: 139 mmol/L (ref 135–145)
Total Bilirubin: 0.7 mg/dL (ref 0.3–1.2)
Total Protein: 6.7 g/dL (ref 6.5–8.1)

## 2017-06-29 MED ORDER — IRINOTECAN HCL CHEMO INJECTION 100 MG/5ML
150.0000 mg/m2 | Freq: Once | INTRAVENOUS | Status: AC
  Administered 2017-06-29: 260 mg via INTRAVENOUS
  Filled 2017-06-29: qty 13

## 2017-06-29 MED ORDER — SODIUM CHLORIDE 0.9 % IV SOLN
2400.0000 mg/m2 | INTRAVENOUS | Status: DC
  Administered 2017-06-29: 4050 mg via INTRAVENOUS
  Filled 2017-06-29: qty 81

## 2017-06-29 MED ORDER — ATROPINE SULFATE 1 MG/ML IJ SOLN
0.5000 mg | Freq: Once | INTRAMUSCULAR | Status: AC | PRN
  Administered 2017-06-29: 0.5 mg via INTRAVENOUS
  Filled 2017-06-29: qty 1

## 2017-06-29 MED ORDER — DEXAMETHASONE SODIUM PHOSPHATE 10 MG/ML IJ SOLN
10.0000 mg | Freq: Once | INTRAMUSCULAR | Status: AC
  Administered 2017-06-29: 10 mg via INTRAVENOUS
  Filled 2017-06-29: qty 1

## 2017-06-29 MED ORDER — DEXTROSE 5 % IV SOLN
150.0000 mg | Freq: Once | INTRAVENOUS | Status: AC
  Administered 2017-06-29: 150 mg via INTRAVENOUS
  Filled 2017-06-29: qty 20

## 2017-06-29 MED ORDER — PALONOSETRON HCL INJECTION 0.25 MG/5ML
0.2500 mg | Freq: Once | INTRAVENOUS | Status: AC
  Administered 2017-06-29: 0.25 mg via INTRAVENOUS
  Filled 2017-06-29: qty 5

## 2017-06-29 MED ORDER — DEXTROSE 5 % IV SOLN
Freq: Once | INTRAVENOUS | Status: AC
  Administered 2017-06-29: 10:00:00 via INTRAVENOUS
  Filled 2017-06-29: qty 1000

## 2017-06-29 MED ORDER — LEUCOVORIN CALCIUM INJECTION 350 MG
700.0000 mg | Freq: Once | INTRAMUSCULAR | Status: AC
  Administered 2017-06-29: 700 mg via INTRAVENOUS
  Filled 2017-06-29: qty 35

## 2017-06-29 NOTE — Progress Notes (Signed)
Patient c/o having pain after chemo.

## 2017-06-29 NOTE — Progress Notes (Signed)
[place] CONSULT NOTE  Patient Care Team: Maeola Sarah, [name], RN as Registered Nurse  CHIEF COMPLAINTS/PURPOSE OF CONSULTATION: Pancreatic cancer  #  Oncology History   # November 2018- PANCREATIC ADENOCARCINOMA pancreatic neck mass [~2.5cm];  [name]; [name]; No mets  # Dec 4th 2018-FOLFIRINOX [neo-adj chemo]   # Nov 2018-biliary obstruction status post ERCP; [name]; [name]; [name]; [name], [name], [name], and PALB2 c.1655A>G (p.Gln552Arg. This is still considered a normal result.  # Molecular testing Not done         Malignant tumor of body of pancreas ([place])   03/24/2017 Initial Diagnosis    Malignant tumor of body of pancreas (HCC)        HISTORY OF PRESENTING ILLNESS:  Nathaniel Saunders 47 y.o.  male with borderline resectable adenocarcinoma the pancreas-stage II is currently on neoadjuvant chemotherapy-FOLFIRINOX status post cycle #4 approximately 2 weeks ago is here to review the results of the CT scan.    Denies any worsening abdominal pain.  His pain is stable on the fentanyl patch; breakthrough pain medication.  Denies any nausea vomiting.  Denies any oral sores.  Denies any fevers or chills.  Weight is stable.  ROS: A complete 10 point review of system is done which is negative except mentioned above in history of present illness  MEDICAL HISTORY:  Asthma [well controlled] Past Medical History:  Diagnosis Date  . Asthma   . Genetic testing 05/20/2017   Multi-Cancer panel (83 genes) @ [facility] - No pathogenic mutations detected  .  Pancreatic mass   . Pancreatic mass     SURGICAL HISTORY: Past Surgical History:  Procedure Laterality Date  . ERCP N/A 04/21/2017   Procedure: ENDOSCOPIC RETROGRADE CHOLANGIOPANCREATOGRAPHY (ERCP);  Surgeon: Lucilla Lame, MD;  Location: [facility] ENDOSCOPY;  Service: Endoscopy;  Laterality: N/A;  . ERCP N/A 05/06/2017   Procedure: ENDOSCOPIC RETROGRADE CHOLANGIOPANCREATOGRAPHY (ERCP);  Surgeon: Lucilla Lame, MD;  Location: [facility] ENDOSCOPY;  Service: Endoscopy;  Laterality: N/A;  . PORTA CATH INSERTION N/A 04/18/2017   Procedure: PORTA CATH INSERTION;  Surgeon: Algernon Huxley, MD;  Location: [place] [facility];  Service: Cardiovascular;  Laterality: N/A;    SOCIAL HISTORY:  he lives in [place]; sister/girl friend; he has children aged  63/18/ 6 years;  quit smkoing - 5 months; alcohol- none; truck driver. Social History   Socioeconomic History  . Marital status: Single    Spouse name: Not on file  . Number of children: Not on file  . Years of education: Not on file  . Highest education level: Not on file  Social Needs  . Financial resource strain: Not hard at all  . Food insecurity - worry: Never true  . Food insecurity - inability: Never true  . Transportation needs - medical: No  . Transportation needs - non-medical: No  Occupational History  . Not on file  Tobacco Use  . Smoking status: Former Smoker    Packs/day: 0.25    Years: 5.00    Pack years: 1.25    Types: Cigarettes    Last attempt to quit: 05/07/2015    Years since quitting: 2.1  . Smokeless  tobacco: Never Used  Substance and Sexual Activity  . Alcohol use: No  . Drug use: No  . Sexual activity: Yes    Partners: Male  Other Topics Concern  . Not on file  Social History Narrative  . Not on file    FAMILY HISTORY: 2 daughters [23 and 36]; boy- 60 years; one sister- 86 years. diagnosis of pancreatic cancer in mother; grandmother the patient's young age.  Family History  Problem Relation Age of Onset  .  Pancreatic cancer Mother 66       deceased 2  . Diabetes Father   . Colon cancer Maternal Grandmother 21       deceased 48    ALLERGIES:  has No Known Allergies.  MEDICATIONS:  Current Outpatient Medications  Medication Sig Dispense Refill  . albuterol (PROVENTIL HFA;VENTOLIN HFA) 108 (90 Base) MCG/ACT inhaler Inhale 1-2 puffs every 6 (six) hours as needed into the lungs for wheezing or shortness of breath.    . lidocaine-prilocaine (EMLA) cream Apply 1 application topically as needed. Apply generously over the Mediport 45 minutes prior to chemotherapy. 30 g 0  . potassium chloride 20 MEQ TBCR Take 10 mEq by mouth daily for 14 days. 14 tablet 0  . loperamide (IMODIUM) 2 MG capsule Take 1 capsule (2 mg total) by mouth as needed for diarrhea or loose stools. (Patient not taking: Reported on 05/25/2017) 30 capsule 0  . ondansetron (ZOFRAN ODT) 4 MG disintegrating tablet Take 1 tablet (4 mg total) by mouth every 8 (eight) hours as needed for nausea or vomiting. (Patient not taking: Reported on 05/25/2017) 20 tablet 0  . ondansetron (ZOFRAN) 8 MG tablet One pill every 8 hours as needed for nausea/vomitting. (Patient not taking: Reported on 05/25/2017) 40 tablet 1  . oxyCODONE-acetaminophen (PERCOCET/ROXICET) 5-325 MG tablet Take 1 tablet by mouth every 6 (six) hours as needed. (Patient not taking: Reported on 06/29/2017) 30 tablet 0  . prochlorperazine (COMPAZINE) 10 MG tablet Take 1 tablet (10 mg total) by mouth every 6 (six) hours as needed for nausea or vomiting. (Patient not taking: Reported on 06/15/2017) 30 tablet 0  . promethazine (PHENERGAN) 12.5 MG tablet Take 1 tablet (12.5 mg total) by mouth every 8 (eight) hours as needed for nausea or vomiting. (Patient not taking: Reported on 06/15/2017) 10 tablet 0  . promethazine (PHENERGAN) 25 MG suppository Place 1 suppository (25 mg total) rectally every 8 (eight) hours as needed for nausea or vomiting. (Patient not taking: Reported on 06/15/2017) 12 each 0    No current facility-administered medications for this visit.    Facility-Administered Medications Ordered in Other Visits  Medication Dose Route Frequency Provider Last Rate Last Dose  . fluorouracil (ADRUCIL) 4,050 mg in sodium chloride 0.9 % 69 mL chemo infusion  2,400 mg/m2 (Treatment Plan Recorded) Intravenous 1 day or 1 dose Charlaine Dalton R, MD      . irinotecan (CAMPTOSAR) 260 mg in dextrose 5 % 500 mL chemo infusion  150 mg/m2 (Treatment Plan Recorded) Intravenous Once Cammie Sickle, MD 342 mL/hr at 06/29/17 1339 260 mg at 06/29/17 1339  . leucovorin 700 mg in dextrose 5 % 250 mL infusion  700 mg Intravenous Once Cammie Sickle, MD 190 mL/hr at 06/29/17 1339 700 mg at 06/29/17 1339  . sodium chloride flush (NS) 0.9 % injection 10 mL  10 mL Intravenous PRN Cammie Sickle, MD   10 mL at 06/15/17 0941      .  PHYSICAL EXAMINATION: ECOG  PERFORMANCE STATUS: 1 - Symptomatic but completely ambulatory  Vitals:   06/29/17 0926  BP: 123/86  Pulse: 79  Temp: 97.8 F (36.6 C)   Filed Weights   06/29/17 0926  Weight: 149 lb 12.8 oz (67.9 kg)    GENERAL: Well-nourished well-developed; Alert, no distress and comfortable.   Alone. EYES: no pallor or icterus OROPHARYNX: no thrush or ulceration; poor dentition  NECK: supple, no masses felt LYMPH:  no palpable lymphadenopathy in the cervical, axillary or inguinal regions LUNGS: clear to auscultation and  No wheeze or crackles HEART/CVS: regular rate & rhythm and no murmurs; No lower extremity edema ABDOMEN: abdomen soft, non-tender and normal bowel sounds Musculoskeletal:no cyanosis of digits and no clubbing  PSYCH: alert & oriented x 3 with fluent speech NEURO: no focal motor/sensory deficits SKIN:  no rashes or significant lesions  LABORATORY DATA:  I have reviewed the data as listed Lab Results  Component Value Date   WBC 15.5 (H) 06/29/2017   HGB 12.2 (L) 06/29/2017   HCT 36.5 (L) 06/29/2017    MCV 95.1 06/29/2017   PLT 271 06/29/2017   Recent Labs    03/24/17 1537  04/22/17 0443 04/26/17 0840  06/01/17 1311 06/15/17 0948 06/29/17 0911  NA  --    < > 137 136   < > 135 136 139  K  --    < > 3.8 3.3*   < > 3.2* 3.9 3.7  CL  --    < > 109 103   < > 103 107 105  CO2  --    < > 22 27   < > 23 26 27   GLUCOSE  --    < > 94 158*   < > 109* 101* 150*  BUN  --    < > 8 10   < > 9 8 9   CREATININE  --    < > 0.64 0.74   < > 0.71 0.78 0.69  CALCIUM  --    < > 8.7* 8.9   < > 9.0 8.8* 8.7*  GFRNONAA  --    < > >60 >60   < > >60 >60 >60  GFRAA  --    < > >60 >60   < > >60 >60 >60  PROT 8.0   < > 6.5 7.7   < > 7.4 7.2 6.7  ALBUMIN 4.2   < > 2.9* 3.6   < > 3.7 3.7 3.4*  AST 21   < > 198* 64*   < > 23 24 21   ALT 13*   < > 512* 237*   < > 30 20 15*  ALKPHOS 54   < > 417* 315*   < > 207* 90 113  BILITOT 0.6   < > 6.5* 3.7*   < > 1.5* 1.5* 0.7  BILIDIR 0.1  --  3.0* 1.8*  --   --   --   --   IBILI 0.5  --  3.5* 1.9*  --   --   --   --    < > = values in this interval not displayed.    RADIOGRAPHIC STUDIES: I have personally reviewed the radiological images as listed and agreed with the findings in the report. Ct Pancreas Abd W/wo  Result Date: 06/27/2017 CLINICAL DATA:  Restaging pancreatic cancer. Fibroids of chemotherapy. Weight loss and early satiety. EXAM: CT ABDOMEN WITHOUT AND WITH CONTRAST TECHNIQUE: Multidetector CT imaging of the abdomen was performed following  the standard protocol before and following the bolus administration of intravenous contrast. CONTRAST:  165m ISOVUE-370 IOPAMIDOL (ISOVUE-370) INJECTION 76% COMPARISON:  05/06/2017, 04/29/2017, 04/01/2017 and 03/23/2017. FINDINGS: Lower chest: Scarring and volume loss in the right middle lobe and both lower lobes, as on prior exams. Heart size normal. No pericardial effusion. Trace left pleural fluid or pleural thickening. Hepatobiliary: Liver is grossly unremarkable. Mild left intrahepatic biliary ductal dilatation.  Pneumobilia with a common bile duct wall stent in place. The stent extends into the duodenum, as before. Gallbladder is decompressed. Pancreas: Heterogeneous hypoattenuating pancreatic neck mass measures approximately 2.0 x 3.3 cm (series 13, image 52), likely stable from 04/01/2017 when remeasured on that exam. Associated pancreatic ductal dilatation, measuring up to 9 mm, with poor enhancement of the pancreatic body and tail. A separate nonenhancing cystic structure in the pancreatic tail measures 1.2 cm (series 13, image 36), unchanged from 03/23/2017. Spleen: Negative. Adrenals/Urinary Tract: Adrenal glands are unremarkable. Nonenhancing 1.3 cm cyst in the right kidney. Kidneys are otherwise unremarkable. Stomach/Bowel: Stomach is unremarkable. Common bile duct stent terminates in the duodenum, as before. Visualized portions of the small bowel and colon are otherwise grossly unremarkable. Vascular/Lymphatic: Mild atherosclerotic calcification of the arterial vasculature. Mild attenuation of the main portal vein near the portal splenic confluence, as before. Vascular structures are patent. No definite pathologically enlarged lymph nodes. Other: None. Musculoskeletal: No worrisome lytic or sclerotic lesions. IMPRESSION: 1. Pancreatic neck mass is grossly stable in size with associated ductal dilatation, poor enhancement of the body and tail and separate pancreatic tail cyst or side branch ectasia. No evidence of metastatic disease. 2. Wall stent in the common bile duct, grossly stable in position. 3. Mild attenuation of the portal vein, near the portal splenic confluence, stable. 4.  Aortic atherosclerosis (ICD10-170.0). Electronically Signed   By: MLorin PicketM.D.   On: 06/27/2017 10:02   IMPRESSION: Pancreatic mass again noted compatible with patient's known history of pancreatic malignancy. Interval exchange of plastic biliary stent for a metallic stent. Decreasing biliary ductal dilatation.  Mild pneumobilia.  Stable pancreatic ductal dilatation.  Stable scarring in the left lung base.   Electronically Signed   By: KRolm BaptiseM.D.   On: 05/06/2017 20:58 ASSESSMENT & PLAN:   Malignant [place] body of [place] # Pancreatic adenocarcinoma-borderline resectable; status post 4 cycle of neoadjuvant FOLFIRINOX; tolerated better. CT scan pancreas protocol shows stable pancreatic mass [no significant shrinkage as hoped for]; will check with radiation oncology re: role of RT. Will also speak to [facility].  Also present the tumor conference tomorrow.  # proceed with cycle # 5 of FOLFIRINOX today- Labs today reviewed;  acceptable for treatment today.   # Elevated LFTs -biliary/pancreatic ductal obstruction status post ERCP- resolved; increase iri to 150 mg/m2.   # Pain control-better controlled; continue fentanyl patch 25 g and also Percocet.  Better controlled.  # follow up in 2 weeks; labs FOLFIRINOX- onpro; pump off 2 days.  Discussed with CRoderic Ovens Discussed with Dr.Crystal- agrees with plan. Will make a referral.  # I reviewed the blood work- with the patient in detail; also reviewed the imaging independently [as summarized above]; and with the patient in detail.   All questions were answered. The patient knows to call the clinic with any problems, questions or concerns.    GCammie Sickle MD 06/29/2017 1:43 PM

## 2017-06-29 NOTE — Assessment & Plan Note (Addendum)
#   Pancreatic adenocarcinoma-borderline resectable; status post 4 cycle of neoadjuvant FOLFIRINOX; tolerated better. CT scan pancreas protocol shows stable pancreatic mass [no significant shrinkage as hoped for]; will check with radiation oncology re: role of RT. Will also speak to [facility].  Also present the tumor conference tomorrow.  # proceed with cycle # 5 of FOLFIRINOX today- Labs today reviewed;  acceptable for treatment today.   # Elevated LFTs -biliary/pancreatic ductal obstruction status post ERCP- resolved; increase iri to 150 mg/m2.   # Pain control-better controlled; continue fentanyl patch 25 g and also Percocet.  Better controlled.  # follow up in 2 weeks; labs FOLFIRINOX- onpro; pump off 2 days.  Discussed with Roderic Ovens. Discussed with Dr.Crystal- agrees with plan. Will make a referral.  # I reviewed the blood work- with the patient in detail; also reviewed the imaging independently [as summarized above]; and with the patient in detail.

## 2017-06-29 NOTE — Telephone Encounter (Signed)
Spoke to [facility]; [facility]- re: CT scan results. Not in favor of radiation for now; he wants to review images by himself.  Drue Dun- can you have the images from CT scan loaded in powershare? And let Dr.Allen know. Thx

## 2017-06-30 NOTE — Progress Notes (Signed)
Images sent via Powershare to [facility] at request of Dr. Rogue Bussing. Dr. Zenia Resides notified that they were sent so he can accept them. Oncology Nurse Navigator Documentation  Navigator Location: [facility] (06/30/17 1500)   )Navigator Encounter Type: Other (06/30/17 1500)                                                    Time Spent with Patient: 15 (06/30/17 1500)

## 2017-07-01 ENCOUNTER — Inpatient Hospital Stay: Payer: Medicaid Other

## 2017-07-01 VITALS — BP 128/79 | HR 83 | Temp 97.9°F | Resp 17

## 2017-07-01 DIAGNOSIS — Z5111 Encounter for antineoplastic chemotherapy: Secondary | ICD-10-CM | POA: Diagnosis not present

## 2017-07-01 DIAGNOSIS — C251 Malignant neoplasm of body of pancreas: Secondary | ICD-10-CM

## 2017-07-01 MED ORDER — SODIUM CHLORIDE 0.9% FLUSH
10.0000 mL | INTRAVENOUS | Status: DC | PRN
  Administered 2017-07-01: 10 mL
  Filled 2017-07-01: qty 10

## 2017-07-01 MED ORDER — HEPARIN SOD (PORK) LOCK FLUSH 100 UNIT/ML IV SOLN
500.0000 [IU] | Freq: Once | INTRAVENOUS | Status: AC | PRN
  Administered 2017-07-01: 500 [IU]

## 2017-07-01 MED ORDER — PEGFILGRASTIM 6 MG/0.6ML ~~LOC~~ PSKT
6.0000 mg | PREFILLED_SYRINGE | Freq: Once | SUBCUTANEOUS | Status: AC
  Administered 2017-07-01: 6 mg via SUBCUTANEOUS

## 2017-07-13 ENCOUNTER — Inpatient Hospital Stay: Payer: Medicaid Other

## 2017-07-13 ENCOUNTER — Encounter: Payer: Self-pay | Admitting: Internal Medicine

## 2017-07-13 ENCOUNTER — Other Ambulatory Visit: Payer: Self-pay

## 2017-07-13 ENCOUNTER — Inpatient Hospital Stay (HOSPITAL_BASED_OUTPATIENT_CLINIC_OR_DEPARTMENT_OTHER): Payer: Medicaid Other | Admitting: Internal Medicine

## 2017-07-13 VITALS — BP 108/71 | HR 72 | Temp 97.9°F | Resp 20 | Ht 63.0 in | Wt 143.4 lb

## 2017-07-13 VITALS — BP 128/83 | HR 61 | Resp 20

## 2017-07-13 DIAGNOSIS — N281 Cyst of kidney, acquired: Secondary | ICD-10-CM

## 2017-07-13 DIAGNOSIS — Z7689 Persons encountering health services in other specified circumstances: Secondary | ICD-10-CM | POA: Diagnosis not present

## 2017-07-13 DIAGNOSIS — I700 Atherosclerosis of aorta: Secondary | ICD-10-CM

## 2017-07-13 DIAGNOSIS — Z87891 Personal history of nicotine dependence: Secondary | ICD-10-CM

## 2017-07-13 DIAGNOSIS — K8681 Exocrine pancreatic insufficiency: Secondary | ICD-10-CM

## 2017-07-13 DIAGNOSIS — C251 Malignant neoplasm of body of pancreas: Secondary | ICD-10-CM | POA: Diagnosis not present

## 2017-07-13 DIAGNOSIS — Z79899 Other long term (current) drug therapy: Secondary | ICD-10-CM | POA: Diagnosis not present

## 2017-07-13 DIAGNOSIS — Z5111 Encounter for antineoplastic chemotherapy: Secondary | ICD-10-CM

## 2017-07-13 LAB — COMPREHENSIVE METABOLIC PANEL
ALT: 15 U/L — ABNORMAL LOW (ref 17–63)
AST: 16 U/L (ref 15–41)
Albumin: 3.6 g/dL (ref 3.5–5.0)
Alkaline Phosphatase: 117 U/L (ref 38–126)
Anion gap: 4 — ABNORMAL LOW (ref 5–15)
BUN: 11 mg/dL (ref 6–20)
CHLORIDE: 107 mmol/L (ref 101–111)
CO2: 27 mmol/L (ref 22–32)
Calcium: 8.8 mg/dL — ABNORMAL LOW (ref 8.9–10.3)
Creatinine, Ser: 0.8 mg/dL (ref 0.61–1.24)
Glucose, Bld: 85 mg/dL (ref 65–99)
POTASSIUM: 4 mmol/L (ref 3.5–5.1)
Sodium: 138 mmol/L (ref 135–145)
Total Bilirubin: 0.6 mg/dL (ref 0.3–1.2)
Total Protein: 6.9 g/dL (ref 6.5–8.1)

## 2017-07-13 LAB — CBC WITH DIFFERENTIAL/PLATELET
Basophils Absolute: 0 10*3/uL (ref 0–0.1)
Basophils Relative: 0 %
EOS ABS: 0.2 10*3/uL (ref 0–0.7)
Eosinophils Relative: 2 %
HCT: 38.3 % — ABNORMAL LOW (ref 40.0–52.0)
HEMOGLOBIN: 13.1 g/dL (ref 13.0–18.0)
LYMPHS ABS: 2.9 10*3/uL (ref 1.0–3.6)
LYMPHS PCT: 22 %
MCH: 33.2 pg (ref 26.0–34.0)
MCHC: 34.1 g/dL (ref 32.0–36.0)
MCV: 97.4 fL (ref 80.0–100.0)
Monocytes Absolute: 1 10*3/uL (ref 0.2–1.0)
Monocytes Relative: 8 %
NEUTROS PCT: 68 %
Neutro Abs: 9.2 10*3/uL — ABNORMAL HIGH (ref 1.4–6.5)
Platelets: 257 10*3/uL (ref 150–440)
RBC: 3.94 MIL/uL — AB (ref 4.40–5.90)
RDW: 16.4 % — ABNORMAL HIGH (ref 11.5–14.5)
WBC: 13.4 10*3/uL — AB (ref 3.8–10.6)

## 2017-07-13 MED ORDER — METHYLPREDNISOLONE SODIUM SUCC 125 MG IJ SOLR
125.0000 mg | Freq: Once | INTRAMUSCULAR | Status: AC
  Administered 2017-07-13: 125 mg via INTRAVENOUS

## 2017-07-13 MED ORDER — DIPHENHYDRAMINE HCL 50 MG/ML IJ SOLN
25.0000 mg | Freq: Once | INTRAMUSCULAR | Status: AC
  Administered 2017-07-13: 25 mg via INTRAVENOUS

## 2017-07-13 MED ORDER — ATROPINE SULFATE 1 MG/ML IJ SOLN
INTRAMUSCULAR | Status: AC
  Filled 2017-07-13: qty 1

## 2017-07-13 MED ORDER — SODIUM CHLORIDE 0.9 % IV SOLN
2400.0000 mg/m2 | INTRAVENOUS | Status: DC
  Administered 2017-07-13: 4050 mg via INTRAVENOUS
  Filled 2017-07-13: qty 81

## 2017-07-13 MED ORDER — IRINOTECAN HCL CHEMO INJECTION 100 MG/5ML
150.0000 mg/m2 | Freq: Once | INTRAVENOUS | Status: AC
  Administered 2017-07-13: 260 mg via INTRAVENOUS
  Filled 2017-07-13: qty 3

## 2017-07-13 MED ORDER — PALONOSETRON HCL INJECTION 0.25 MG/5ML
0.2500 mg | Freq: Once | INTRAVENOUS | Status: AC
  Administered 2017-07-13: 0.25 mg via INTRAVENOUS
  Filled 2017-07-13: qty 5

## 2017-07-13 MED ORDER — DEXTROSE 5 % IV SOLN
Freq: Once | INTRAVENOUS | Status: AC
  Administered 2017-07-13: 10:00:00 via INTRAVENOUS
  Filled 2017-07-13: qty 1000

## 2017-07-13 MED ORDER — SODIUM CHLORIDE 0.9 % IV SOLN
INTRAVENOUS | Status: DC
  Administered 2017-07-13: 13:00:00 via INTRAVENOUS

## 2017-07-13 MED ORDER — DEXAMETHASONE SODIUM PHOSPHATE 10 MG/ML IJ SOLN
10.0000 mg | Freq: Once | INTRAMUSCULAR | Status: AC
  Administered 2017-07-13: 10 mg via INTRAVENOUS
  Filled 2017-07-13: qty 1

## 2017-07-13 MED ORDER — OXALIPLATIN CHEMO INJECTION 100 MG/20ML
150.0000 mg | Freq: Once | INTRAVENOUS | Status: AC
  Administered 2017-07-13: 150 mg via INTRAVENOUS
  Filled 2017-07-13: qty 30

## 2017-07-13 MED ORDER — HEPARIN SOD (PORK) LOCK FLUSH 100 UNIT/ML IV SOLN
500.0000 [IU] | Freq: Once | INTRAVENOUS | Status: DC
  Filled 2017-07-13: qty 5

## 2017-07-13 MED ORDER — ATROPINE SULFATE 1 MG/ML IJ SOLN
0.5000 mg | Freq: Once | INTRAMUSCULAR | Status: AC | PRN
  Administered 2017-07-13: 0.5 mg via INTRAVENOUS
  Filled 2017-07-13: qty 1

## 2017-07-13 MED ORDER — LEUCOVORIN CALCIUM INJECTION 350 MG
700.0000 mg | Freq: Once | INTRAVENOUS | Status: AC
  Administered 2017-07-13: 700 mg via INTRAVENOUS
  Filled 2017-07-13: qty 35

## 2017-07-13 MED ORDER — ATROPINE SULFATE 1 MG/ML IJ SOLN
0.5000 mg | Freq: Once | INTRAMUSCULAR | Status: AC
  Administered 2017-07-13: 0.5 mg via INTRAVENOUS

## 2017-07-13 MED ORDER — SODIUM CHLORIDE 0.9% FLUSH
10.0000 mL | INTRAVENOUS | Status: DC | PRN
  Administered 2017-07-13: 10 mL via INTRAVENOUS
  Filled 2017-07-13: qty 10

## 2017-07-13 NOTE — Assessment & Plan Note (Addendum)
#   Pancreatic adenocarcinoma-borderline resectable; status post 4 cycle of neoadjuvant FOLFIRINOX; tolerated better. CT scan pancreas protocol shows slight shrinkage of the pancreatic mass. Discussed with Dr.Allen; at [place].  Will again plan imaging CT pancreatic protocol after 8 cycles.  # proceed with cycle # 6 of FOLFIRINOX today- Labs today reviewed;  acceptable for treatment today. Iri at 150mg /m2.    # Pain control-better controlled; continue fentanyl patch 25 g and also Percocet.  Better controlled.  # follow up in 2 weeks; labs FOLFIRINOX- onpro; pump off 2 days.

## 2017-07-13 NOTE — Progress Notes (Signed)
[facility] CONSULT NOTE  Patient Care Team: Maeola Sarah, [name], RN as Registered Nurse  CHIEF COMPLAINTS/PURPOSE OF CONSULTATION: Pancreatic cancer  #  Oncology History   # November 2018- PANCREATIC ADENOCARCINOMA pancreatic neck mass [~2.5cm];  [name]; [name]; No mets  # Dec 4th 2018-FOLFIRINOX [neo-adj chemo]   # Nov 2018-biliary obstruction status post ERCP; [name]; [name]; [name]; [name], [name], [name], and PALB2 c.1655A>G (p.Gln552Arg. This is still considered a normal result.  # Molecular testing Not done         Malignant tumor of body of pancreas ([place])   03/24/2017 Initial Diagnosis    Malignant tumor of body of pancreas ([facility])        HISTORY OF PRESENTING ILLNESS:  Nathaniel Saunders 47 y.o.  male with borderline resectable adenocarcinoma the pancreas-stage II is currently on neoadjuvant chemotherapy-FOLFIRINOX status post cycle #5 approximately 2 weeks ago is here for follow-up.  Patient admits to intermittent abdominal pain fairly soon after chemotherapy.  However pain improves after a few days.  His pain is overall currently stable on fentanyl patch.  He is not needing to take significant breakthrough pain medication.  Denies any nausea vomiting.  Denies any oral sores.  Denies any fevers or chills.  Mild weight loss.  ROS: A complete 10 point review of system is done which is negative except mentioned above in history of present illness  MEDICAL HISTORY:  Asthma [well controlled] Past Medical History:  Diagnosis Date  . Asthma   .  Genetic testing 05/20/2017   Multi-Cancer panel (83 genes) @ [facility] - No pathogenic mutations detected  . Pancreatic mass   . Pancreatic mass     SURGICAL HISTORY: Past Surgical History:  Procedure Laterality Date  . ERCP N/A 04/21/2017   Procedure: ENDOSCOPIC RETROGRADE CHOLANGIOPANCREATOGRAPHY (ERCP);  Surgeon: Lucilla Lame, MD;  Location: [facility] ENDOSCOPY;  Service: Endoscopy;  Laterality: N/A;  . ERCP N/A 05/06/2017   Procedure: ENDOSCOPIC RETROGRADE CHOLANGIOPANCREATOGRAPHY (ERCP);  Surgeon: Lucilla Lame, MD;  Location: [facility] ENDOSCOPY;  Service: Endoscopy;  Laterality: N/A;  . PORTA CATH INSERTION N/A 04/18/2017   Procedure: PORTA CATH INSERTION;  Surgeon: Algernon Huxley, MD;  Location: [facility] LAB;  Service: Cardiovascular;  Laterality: N/A;    SOCIAL HISTORY:  he lives in [place]; sister/girl friend; he has children aged  80/18/ 6 years;  quit smkoing - 5 months; alcohol- none; truck driver. Social History   Socioeconomic History  . Marital status: Single    Spouse name: Not on file  . Number of children: Not on file  . Years of education: Not on file  . Highest education level: Not on file  Social Needs  . Financial resource strain: Not hard at all  . Food insecurity - worry: Never true  . Food insecurity - inability: Never true  . Transportation needs - medical: No  . Transportation needs - non-medical: No  Occupational History  . Not on file  Tobacco Use  . Smoking status: Former Smoker    Packs/day: 0.25    Years: 5.00    Pack years: 1.25    Types: Cigarettes    Last  attempt to quit: 05/07/2015    Years since quitting: 2.1  . Smokeless tobacco: Never Used  Substance and Sexual Activity  . Alcohol use: No  . Drug use: No  . Sexual activity: Yes    Partners: Male  Other Topics Concern  . Not on file  Social History Narrative  . Not on file    FAMILY HISTORY: 2 daughters [23 and 30]; boy- 22 years; one sister- 52 years. diagnosis of pancreatic  cancer in mother; grandmother the patient's young age.  Family History  Problem Relation Age of Onset  . Pancreatic cancer Mother 35       deceased 31  . Diabetes Father   . Colon cancer Maternal Grandmother 43       deceased 76    ALLERGIES:  has No Known Allergies.  MEDICATIONS:  Current Outpatient Medications  Medication Sig Dispense Refill  . lidocaine-prilocaine (EMLA) cream Apply 1 application topically as needed. Apply generously over the Mediport 45 minutes prior to chemotherapy. 30 g 0  . potassium chloride 20 MEQ TBCR Take 10 mEq by mouth daily for 14 days. 14 tablet 0  . albuterol (PROVENTIL HFA;VENTOLIN HFA) 108 (90 Base) MCG/ACT inhaler Inhale 1-2 puffs every 6 (six) hours as needed into the lungs for wheezing or shortness of breath.    . loperamide (IMODIUM) 2 MG capsule Take 1 capsule (2 mg total) by mouth as needed for diarrhea or loose stools. (Patient not taking: Reported on 05/25/2017) 30 capsule 0  . ondansetron (ZOFRAN ODT) 4 MG disintegrating tablet Take 1 tablet (4 mg total) by mouth every 8 (eight) hours as needed for nausea or vomiting. (Patient not taking: Reported on 05/25/2017) 20 tablet 0  . ondansetron (ZOFRAN) 8 MG tablet One pill every 8 hours as needed for nausea/vomitting. (Patient not taking: Reported on 05/25/2017) 40 tablet 1  . oxyCODONE-acetaminophen (PERCOCET/ROXICET) 5-325 MG tablet Take 1 tablet by mouth every 6 (six) hours as needed. (Patient not taking: Reported on 06/29/2017) 30 tablet 0  . prochlorperazine (COMPAZINE) 10 MG tablet Take 1 tablet (10 mg total) by mouth every 6 (six) hours as needed for nausea or vomiting. (Patient not taking: Reported on 06/15/2017) 30 tablet 0  . promethazine (PHENERGAN) 12.5 MG tablet Take 1 tablet (12.5 mg total) by mouth every 8 (eight) hours as needed for nausea or vomiting. (Patient not taking: Reported on 06/15/2017) 10 tablet 0  . promethazine (PHENERGAN) 25 MG suppository Place 1 suppository (25 mg total) rectally  every 8 (eight) hours as needed for nausea or vomiting. (Patient not taking: Reported on 06/15/2017) 12 each 0   No current facility-administered medications for this visit.    Facility-Administered Medications Ordered in Other Visits  Medication Dose Route Frequency Provider Last Rate Last Dose  . fluorouracil (ADRUCIL) 4,050 mg in sodium chloride 0.9 % 69 mL chemo infusion  2,400 mg/m2 (Treatment Plan Recorded) Intravenous 1 day or 1 dose Charlaine Dalton R, MD      . heparin lock flush 100 unit/mL  500 Units Intravenous Once Cammie Sickle, MD      . irinotecan (CAMPTOSAR) 260 mg in dextrose 5 % 500 mL chemo infusion  150 mg/m2 (Treatment Plan Recorded) Intravenous Once Charlaine Dalton R, MD      . leucovorin 700 mg in dextrose 5 % 250 mL infusion  700 mg Intravenous Once Charlaine Dalton R, MD      . oxaliplatin (ELOXATIN) 150 mg in dextrose 5 % 500 mL chemo infusion  150 mg Intravenous Once Cammie Sickle, MD 265 mL/hr at 07/13/17 1012 150 mg at 07/13/17 1012  . sodium chloride flush (NS) 0.9 % injection 10 mL  10 mL Intravenous PRN Cammie Sickle, MD   10 mL at 06/15/17 0941  . sodium chloride flush (NS) 0.9 % injection 10 mL  10 mL Intravenous PRN Cammie Sickle, MD   10 mL at 07/13/17 0813      .  PHYSICAL EXAMINATION: ECOG PERFORMANCE STATUS: 1 - Symptomatic but completely ambulatory  Vitals:   07/13/17 0854  BP: 108/71  Pulse: 72  Resp: 20  Temp: 97.9 F (36.6 C)   Filed Weights   07/13/17 0854  Weight: 143 lb 6.4 oz (65 kg)    GENERAL: Well-nourished well-developed; Alert, no distress and comfortable.   Alone. EYES: no pallor or icterus OROPHARYNX: no thrush or ulceration; poor dentition  NECK: supple, no masses felt LYMPH:  no palpable lymphadenopathy in the cervical, axillary or inguinal regions LUNGS: clear to auscultation and  No wheeze or crackles HEART/CVS: regular rate & rhythm and no murmurs; No lower extremity  edema ABDOMEN: abdomen soft, non-tender and normal bowel sounds Musculoskeletal:no cyanosis of digits and no clubbing  PSYCH: alert & oriented x 3 with fluent speech NEURO: no focal motor/sensory deficits SKIN:  no rashes or significant lesions  LABORATORY DATA:  I have reviewed the data as listed Lab Results  Component Value Date   WBC 13.4 (H) 07/13/2017   HGB 13.1 07/13/2017   HCT 38.3 (L) 07/13/2017   MCV 97.4 07/13/2017   PLT 257 07/13/2017   Recent Labs    03/24/17 1537  04/22/17 0443 04/26/17 0840  06/15/17 0948 06/29/17 0911 07/13/17 0840  NA  --    < > 137 136   < > 136 139 138  K  --    < > 3.8 3.3*   < > 3.9 3.7 4.0  CL  --    < > 109 103   < > 107 105 107  CO2  --    < > 22 27   < > 26 27 27   GLUCOSE  --    < > 94 158*   < > 101* 150* 85  BUN  --    < > 8 10   < > 8 9 11   CREATININE  --    < > 0.64 0.74   < > 0.78 0.69 0.80  CALCIUM  --    < > 8.7* 8.9   < > 8.8* 8.7* 8.8*  GFRNONAA  --    < > >60 >60   < > >60 >60 >60  GFRAA  --    < > >60 >60   < > >60 >60 >60  PROT 8.0   < > 6.5 7.7   < > 7.2 6.7 6.9  ALBUMIN 4.2   < > 2.9* 3.6   < > 3.7 3.4* 3.6  AST 21   < > 198* 64*   < > 24 21 16   ALT 13*   < > 512* 237*   < > 20 15* 15*  ALKPHOS 54   < > 417* 315*   < > 90 113 117  BILITOT 0.6   < > 6.5* 3.7*   < > 1.5* 0.7 0.6  BILIDIR 0.1  --  3.0* 1.8*  --   --   --   --   IBILI 0.5  --  3.5* 1.9*  --   --   --   --    < > =  values in this interval not displayed.    RADIOGRAPHIC STUDIES: I have personally reviewed the radiological images as listed and agreed with the findings in the report. Ct Pancreas Abd W/wo  Result Date: 06/27/2017 CLINICAL DATA:  Restaging pancreatic cancer. Fibroids of chemotherapy. Weight loss and early satiety. EXAM: CT ABDOMEN WITHOUT AND WITH CONTRAST TECHNIQUE: Multidetector CT imaging of the abdomen was performed following the standard protocol before and following the bolus administration of intravenous contrast. CONTRAST:  150m  ISOVUE-370 IOPAMIDOL (ISOVUE-370) INJECTION 76% COMPARISON:  05/06/2017, 04/29/2017, 04/01/2017 and 03/23/2017. FINDINGS: Lower chest: Scarring and volume loss in the right middle lobe and both lower lobes, as on prior exams. Heart size normal. No pericardial effusion. Trace left pleural fluid or pleural thickening. Hepatobiliary: Liver is grossly unremarkable. Mild left intrahepatic biliary ductal dilatation. Pneumobilia with a common bile duct wall stent in place. The stent extends into the duodenum, as before. Gallbladder is decompressed. Pancreas: Heterogeneous hypoattenuating pancreatic neck mass measures approximately 2.0 x 3.3 cm (series 13, image 52), likely stable from 04/01/2017 when remeasured on that exam. Associated pancreatic ductal dilatation, measuring up to 9 mm, with poor enhancement of the pancreatic body and tail. A separate nonenhancing cystic structure in the pancreatic tail measures 1.2 cm (series 13, image 36), unchanged from 03/23/2017. Spleen: Negative. Adrenals/Urinary Tract: Adrenal glands are unremarkable. Nonenhancing 1.3 cm cyst in the right kidney. Kidneys are otherwise unremarkable. Stomach/Bowel: Stomach is unremarkable. Common bile duct stent terminates in the duodenum, as before. Visualized portions of the small bowel and colon are otherwise grossly unremarkable. Vascular/Lymphatic: Mild atherosclerotic calcification of the arterial vasculature. Mild attenuation of the main portal vein near the portal splenic confluence, as before. Vascular structures are patent. No definite pathologically enlarged lymph nodes. Other: None. Musculoskeletal: No worrisome lytic or sclerotic lesions. IMPRESSION: 1. Pancreatic neck mass is grossly stable in size with associated ductal dilatation, poor enhancement of the body and tail and separate pancreatic tail cyst or side branch ectasia. No evidence of metastatic disease. 2. Wall stent in the common bile duct, grossly stable in position. 3. Mild  attenuation of the portal vein, near the portal splenic confluence, stable. 4.  Aortic atherosclerosis (ICD10-170.0). Electronically Signed   By: MLorin PicketM.D.   On: 06/27/2017 10:02   IMPRESSION: Pancreatic mass again noted compatible with patient's known history of pancreatic malignancy. Interval exchange of plastic biliary stent for a metallic stent. Decreasing biliary ductal dilatation. Mild pneumobilia.  Stable pancreatic ductal dilatation.  Stable scarring in the left lung base.   Electronically Signed   By: KRolm BaptiseM.D.   On: 05/06/2017 20:58 ASSESSMENT & PLAN:   [place] # Pancreatic adenocarcinoma-borderline resectable; status post 4 cycle of neoadjuvant FOLFIRINOX; tolerated better. CT scan pancreas protocol shows slight shrinkage of the pancreatic mass. [place]; [place]   # proceed with cycle # 6 of FOLFIRINOX today- Labs today reviewed;  acceptable for treatment today. Iri at 1569mm2.    # Pain control-better controlled; continue fentanyl patch 25 g and also Percocet.  Better controlled.  # follow up in 2 weeks; labs FOLFIRINOX- onpro; pump off 2 days.    All questions were answered. The patient knows to call the clinic with any problems, questions or concerns.    GoCammie SickleMD 07/13/2017 10:48 AM

## 2017-07-13 NOTE — Progress Notes (Signed)
1324: Patient approximately one hour into receiving Irinotecan/Leucovorin, reports feeling hot and nauseated, head sweating, sharp pain in left side.  RN stopped infusion, started NS, checked vitals (see flowsheet), and called Dr. Rogue Bussing, concurrently.  Patient received Benadryl 25 mg IV, Solumedrol 125 mg IV, and 0.5 Atropine, per Dr. Rogue Bussing.  Vital signs stable.  13:30: Patient reports feeling much better, no continued sweating or nausea.  Dr. Rogue Bussing wants to wait thirty minutes, continue to monitor patient, and restart Irinotecan/Leucovoran if patient returns to baseline.    14:15: Per Dr. Rogue Bussing, restarted Irinotecan and Leucovoran.  Patient tolerated well with no further complaints.

## 2017-07-15 ENCOUNTER — Inpatient Hospital Stay: Payer: Medicaid Other

## 2017-07-15 VITALS — BP 111/73 | HR 73 | Temp 93.3°F | Resp 20

## 2017-07-15 DIAGNOSIS — C251 Malignant neoplasm of body of pancreas: Secondary | ICD-10-CM

## 2017-07-15 DIAGNOSIS — Z95828 Presence of other vascular implants and grafts: Secondary | ICD-10-CM

## 2017-07-15 DIAGNOSIS — Z5111 Encounter for antineoplastic chemotherapy: Secondary | ICD-10-CM | POA: Diagnosis not present

## 2017-07-15 MED ORDER — PEGFILGRASTIM 6 MG/0.6ML ~~LOC~~ PSKT
6.0000 mg | PREFILLED_SYRINGE | Freq: Once | SUBCUTANEOUS | Status: AC
  Administered 2017-07-15: 6 mg via SUBCUTANEOUS

## 2017-07-15 MED ORDER — SODIUM CHLORIDE 0.9% FLUSH
10.0000 mL | INTRAVENOUS | Status: DC | PRN
  Administered 2017-07-15: 10 mL via INTRAVENOUS
  Filled 2017-07-15: qty 10

## 2017-07-15 MED ORDER — HEPARIN SOD (PORK) LOCK FLUSH 100 UNIT/ML IV SOLN
500.0000 [IU] | Freq: Once | INTRAVENOUS | Status: AC
  Administered 2017-07-15: 500 [IU] via INTRAVENOUS

## 2017-07-27 ENCOUNTER — Inpatient Hospital Stay: Payer: Medicaid Other | Attending: Internal Medicine

## 2017-07-27 ENCOUNTER — Inpatient Hospital Stay: Payer: Medicaid Other

## 2017-07-27 ENCOUNTER — Other Ambulatory Visit: Payer: Self-pay

## 2017-07-27 ENCOUNTER — Inpatient Hospital Stay (HOSPITAL_BASED_OUTPATIENT_CLINIC_OR_DEPARTMENT_OTHER): Payer: Medicaid Other | Admitting: Internal Medicine

## 2017-07-27 VITALS — BP 113/76 | HR 69 | Temp 97.9°F | Resp 20 | Ht 63.0 in | Wt 151.1 lb

## 2017-07-27 VITALS — BP 106/68 | HR 80 | Temp 97.0°F | Resp 18

## 2017-07-27 DIAGNOSIS — Z800 Family history of malignant neoplasm of digestive organs: Secondary | ICD-10-CM | POA: Diagnosis not present

## 2017-07-27 DIAGNOSIS — J45909 Unspecified asthma, uncomplicated: Secondary | ICD-10-CM | POA: Diagnosis not present

## 2017-07-27 DIAGNOSIS — C251 Malignant neoplasm of body of pancreas: Secondary | ICD-10-CM

## 2017-07-27 DIAGNOSIS — Z7689 Persons encountering health services in other specified circumstances: Secondary | ICD-10-CM

## 2017-07-27 DIAGNOSIS — Z5111 Encounter for antineoplastic chemotherapy: Secondary | ICD-10-CM | POA: Insufficient documentation

## 2017-07-27 DIAGNOSIS — Z87891 Personal history of nicotine dependence: Secondary | ICD-10-CM | POA: Diagnosis not present

## 2017-07-27 DIAGNOSIS — J1008 Influenza due to other identified influenza virus with other specified pneumonia: Secondary | ICD-10-CM | POA: Insufficient documentation

## 2017-07-27 DIAGNOSIS — Z79899 Other long term (current) drug therapy: Secondary | ICD-10-CM

## 2017-07-27 LAB — CBC WITH DIFFERENTIAL/PLATELET
BASOS PCT: 0 %
Basophils Absolute: 0.1 10*3/uL (ref 0–0.1)
Eosinophils Absolute: 0.2 10*3/uL (ref 0–0.7)
Eosinophils Relative: 2 %
HCT: 38.7 % — ABNORMAL LOW (ref 40.0–52.0)
HEMOGLOBIN: 13.3 g/dL (ref 13.0–18.0)
Lymphocytes Relative: 19 %
Lymphs Abs: 2.4 10*3/uL (ref 1.0–3.6)
MCH: 33.5 pg (ref 26.0–34.0)
MCHC: 34.3 g/dL (ref 32.0–36.0)
MCV: 97.7 fL (ref 80.0–100.0)
MONOS PCT: 8 %
Monocytes Absolute: 1 10*3/uL (ref 0.2–1.0)
NEUTROS ABS: 9.3 10*3/uL — AB (ref 1.4–6.5)
NEUTROS PCT: 71 %
Platelets: 256 10*3/uL (ref 150–440)
RBC: 3.96 MIL/uL — AB (ref 4.40–5.90)
RDW: 15.3 % — ABNORMAL HIGH (ref 11.5–14.5)
WBC: 13 10*3/uL — ABNORMAL HIGH (ref 3.8–10.6)

## 2017-07-27 LAB — COMPREHENSIVE METABOLIC PANEL
ALT: 12 U/L — ABNORMAL LOW (ref 17–63)
AST: 17 U/L (ref 15–41)
Albumin: 3.6 g/dL (ref 3.5–5.0)
Alkaline Phosphatase: 109 U/L (ref 38–126)
Anion gap: 5 (ref 5–15)
BUN: 11 mg/dL (ref 6–20)
CALCIUM: 8.8 mg/dL — AB (ref 8.9–10.3)
CO2: 24 mmol/L (ref 22–32)
Chloride: 108 mmol/L (ref 101–111)
Creatinine, Ser: 0.65 mg/dL (ref 0.61–1.24)
GFR calc non Af Amer: >60 mL/min (ref >60)
Glucose, Bld: 94 mg/dL (ref 65–99)
Potassium: 3.7 mmol/L (ref 3.5–5.1)
SODIUM: 137 mmol/L (ref 135–145)
Total Bilirubin: 0.6 mg/dL (ref 0.3–1.2)
Total Protein: 6.8 g/dL (ref 6.5–8.1)

## 2017-07-27 MED ORDER — HEPARIN SOD (PORK) LOCK FLUSH 100 UNIT/ML IV SOLN: 500.0000 [IU] | Freq: Once | INTRAVENOUS | Status: DC

## 2017-07-27 MED ORDER — IRINOTECAN HCL CHEMO INJECTION 100 MG/5ML
150.0000 mg/m2 | Freq: Once | INTRAVENOUS | Status: AC
  Administered 2017-07-27: 260 mg via INTRAVENOUS
  Filled 2017-07-27: qty 10

## 2017-07-27 MED ORDER — SODIUM CHLORIDE 0.9 % IV SOLN: 20.0000 mg | Freq: Once | INTRAVENOUS | Status: DC

## 2017-07-27 MED ORDER — SODIUM CHLORIDE 0.9 % IV SOLN
20.0000 mg | Freq: Once | INTRAVENOUS | Status: AC
  Administered 2017-07-27: 20 mg via INTRAVENOUS
  Filled 2017-07-27: qty 100

## 2017-07-27 MED ORDER — ATROPINE SULFATE 1 MG/ML IJ SOLN
0.5000 mg | Freq: Once | INTRAMUSCULAR | Status: AC
  Administered 2017-07-27: 0.5 mg via INTRAVENOUS
  Filled 2017-07-27: qty 1

## 2017-07-27 MED ORDER — ATROPINE SULFATE 1 MG/ML IJ SOLN
0.5000 mg | Freq: Once | INTRAMUSCULAR | Status: AC | PRN
  Administered 2017-07-27: 0.5 mg via INTRAVENOUS
  Filled 2017-07-27: qty 1

## 2017-07-27 MED ORDER — FAMOTIDINE IN NACL 20-0.9 MG/50ML-% IV SOLN: 20.0000 mg | Freq: Once | INTRAVENOUS | Status: DC

## 2017-07-27 MED ORDER — PALONOSETRON HCL INJECTION 0.25 MG/5ML
0.2500 mg | Freq: Once | INTRAVENOUS | Status: AC
  Administered 2017-07-27: 0.25 mg via INTRAVENOUS
  Filled 2017-07-27: qty 5

## 2017-07-27 MED ORDER — DEXAMETHASONE SODIUM PHOSPHATE 10 MG/ML IJ SOLN
10.0000 mg | Freq: Once | INTRAMUSCULAR | Status: AC
  Administered 2017-07-27: 10 mg via INTRAVENOUS
  Filled 2017-07-27: qty 1

## 2017-07-27 MED ORDER — SODIUM CHLORIDE 0.9% FLUSH
10.0000 mL | INTRAVENOUS | Status: DC | PRN
  Administered 2017-07-27: 10 mL via INTRAVENOUS
  Filled 2017-07-27: qty 10

## 2017-07-27 MED ORDER — METHYLPREDNISOLONE SODIUM SUCC 125 MG IJ SOLR
125.0000 mg | Freq: Once | INTRAMUSCULAR | Status: AC
  Administered 2017-07-27: 125 mg via INTRAVENOUS

## 2017-07-27 MED ORDER — SODIUM CHLORIDE 0.9 % IV SOLN
2400.0000 mg/m2 | INTRAVENOUS | Status: DC
  Administered 2017-07-27: 4050 mg via INTRAVENOUS
  Filled 2017-07-27: qty 81

## 2017-07-27 MED ORDER — OXALIPLATIN CHEMO INJECTION 100 MG/20ML
150.0000 mg | Freq: Once | INTRAVENOUS | Status: AC
  Administered 2017-07-27: 150 mg via INTRAVENOUS
  Filled 2017-07-27: qty 20

## 2017-07-27 MED ORDER — LEUCOVORIN CALCIUM INJECTION 350 MG
700.0000 mg | Freq: Once | INTRAMUSCULAR | Status: AC
  Administered 2017-07-27: 700 mg via INTRAVENOUS
  Filled 2017-07-27: qty 25

## 2017-07-27 MED ORDER — DEXTROSE 5 % IV SOLN
Freq: Once | INTRAVENOUS | Status: AC
  Administered 2017-07-27: 09:00:00 via INTRAVENOUS
  Filled 2017-07-27: qty 1000

## 2017-07-27 NOTE — Assessment & Plan Note (Addendum)
#   Pancreatic adenocarcinoma-borderline resectable; status post 4 cycle of neoadjuvant FOLFIRINOX; tolerated better. CT scan pancreas protocol shows slight shrinkage of the pancreatic mass. Discussed with Dr.Allen; at [place].  Awaiting re-val at [facility] this week with imaging.  # proceed with cycle # 7 of FOLFIRINOX today- Labs today reviewed;  acceptable for treatment today. Iri at 150mg /m2.    # ? Infusion reaction to iri at last cycle- add atropine/ pepcid to pre-meds.   # Pain control-better controlled; off  fentanyl patch 25 g and also Percocet.    # follow up in 2 weeks; labs FOLFIRINOX- onpro; pump off 2 days.

## 2017-07-27 NOTE — Progress Notes (Signed)
Patient states, "I am really hot again and just started sweating like last time." Irinotecan and Leucovorin stopped. Vital signs obtained and stable. MD, Dr. Rogue Bussing, notified via telephone. Per MD order: place order for Solu-medrol 125mg  IV once and Atropine 0.5mg  IV once to be administered now, wait 30 minutes and if all symptoms resolved, restart Irinotecan and Leucovorin at that time. See MAR.  1427- All symptoms resolved. Patient states, "I am not hot or sweating anymore. I feel much better." Vital signs stable.   1430- Irinotecan and Leucovorin restarted. Continuing to monitor patient for any further symptoms.  1509- Irinotecan and Leucovorin infusions completed. No further symptoms. Patient states, "I feel fine." MD, Dr. Rogue Bussing, notified via telephone. Per MD order: Proceed with connecting Fluorouracil Pump and discharge patient to home.

## 2017-07-27 NOTE — Progress Notes (Signed)
[facility] CONSULT NOTE  Patient Care Team: Maeola Sarah, [name], RN as Registered Nurse  CHIEF COMPLAINTS/PURPOSE OF CONSULTATION: Pancreatic cancer  #  Oncology History   # November 2018- PANCREATIC ADENOCARCINOMA pancreatic neck mass [~2.5cm];  [name]; [name]; No mets  # Dec 4th 2018-FOLFIRINOX [neo-adj chemo]   # Nov 2018-biliary obstruction status post ERCP; [name]; [name]; [name]; [name], [name], [name], and PALB2 c.1655A>G (p.Gln552Arg. This is still considered a normal result.  # Molecular testing Not done         Malignant tumor of body of pancreas ([place])   03/24/2017 Initial Diagnosis    Malignant tumor of body of pancreas ([facility])        HISTORY OF PRESENTING ILLNESS:  Nathaniel Saunders 47 y.o.  male with borderline resectable adenocarcinoma the pancreas-stage II is currently on neoadjuvant chemotherapy-FOLFIRINOX status post cycle #6 approximately 2 weeks ago is here for follow-up.  Last cycle of chemotherapy patient had a possible infusion reaction-2 irinotecan [had flushing/sweats]-which improved after atropine/steroids.  Infusion was completed without any other problems.  Pain improved; not on fentanyl patch.  He is not needing to take significant breakthrough pain medication.  Denies any nausea vomiting.  Denies any oral sores.  Denies any fevers or chills.  Mild weight loss.  ROS: A complete 10 point review of system is done which is negative except mentioned above in history of present illness  MEDICAL HISTORY:  Asthma [well  controlled] Past Medical History:  Diagnosis Date  . Asthma   . Genetic testing 05/20/2017   Multi-Cancer panel (83 genes) @ [facility] - No pathogenic mutations detected  . Pancreatic mass   . Pancreatic mass     SURGICAL HISTORY: Past Surgical History:  Procedure Laterality Date  . ERCP N/A 04/21/2017   Procedure: ENDOSCOPIC RETROGRADE CHOLANGIOPANCREATOGRAPHY (ERCP);  Surgeon: Lucilla Lame, MD;  Location: [facility] ENDOSCOPY;  Service: Endoscopy;  Laterality: N/A;  . ERCP N/A 05/06/2017   Procedure: ENDOSCOPIC RETROGRADE CHOLANGIOPANCREATOGRAPHY (ERCP);  Surgeon: Lucilla Lame, MD;  Location: [facility] ENDOSCOPY;  Service: Endoscopy;  Laterality: N/A;  . PORTA CATH INSERTION N/A 04/18/2017   Procedure: PORTA CATH INSERTION;  Surgeon: Algernon Huxley, MD;  Location: [place] [facility] LAB;  Service: Cardiovascular;  Laterality: N/A;    SOCIAL HISTORY:  he lives in [place]; sister/girl friend; he has children aged  69/18/ 6 years;  quit smkoing - 5 months; alcohol- none; truck driver. Social History   Socioeconomic History  . Marital status: Single    Spouse name: Not on file  . Number of children: Not on file  . Years of education: Not on file  . Highest education level: Not on file  Social Needs  . Financial resource strain: Not hard at all  . Food insecurity - worry: Never true  . Food insecurity - inability: Never true  . Transportation needs - medical: No  . Transportation needs - non-medical: No  Occupational History  . Not on file  Tobacco Use  . Smoking status: Former Smoker    Packs/day: 0.25    Years: 5.00    Pack years: 1.25    Types: Cigarettes  Last attempt to quit: 05/07/2015    Years since quitting: 2.2  . Smokeless tobacco: Never Used  Substance and Sexual Activity  . Alcohol use: No  . Drug use: No  . Sexual activity: Yes    Partners: Male  Other Topics Concern  . Not on file  Social History Narrative  . Not on file    FAMILY HISTORY: 2 daughters [23 and  37]; boy- 74 years; one sister- 8 years. diagnosis of pancreatic cancer in mother; grandmother the patient's young age.  Family History  Problem Relation Age of Onset  . Pancreatic cancer Mother 46       deceased 28  . Diabetes Father   . Colon cancer Maternal Grandmother 20       deceased 40    ALLERGIES:  has No Known Allergies.  MEDICATIONS:  Current Outpatient Medications  Medication Sig Dispense Refill  . albuterol (PROVENTIL HFA;VENTOLIN HFA) 108 (90 Base) MCG/ACT inhaler Inhale 1-2 puffs every 6 (six) hours as needed into the lungs for wheezing or shortness of breath.    . lidocaine-prilocaine (EMLA) cream Apply 1 application topically as needed. Apply generously over the Mediport 45 minutes prior to chemotherapy. 30 g 0  . loperamide (IMODIUM) 2 MG capsule Take 1 capsule (2 mg total) by mouth as needed for diarrhea or loose stools. (Patient not taking: Reported on 05/25/2017) 30 capsule 0  . ondansetron (ZOFRAN ODT) 4 MG disintegrating tablet Take 1 tablet (4 mg total) by mouth every 8 (eight) hours as needed for nausea or vomiting. (Patient not taking: Reported on 05/25/2017) 20 tablet 0  . ondansetron (ZOFRAN) 8 MG tablet One pill every 8 hours as needed for nausea/vomitting. (Patient not taking: Reported on 05/25/2017) 40 tablet 1  . oxyCODONE-acetaminophen (PERCOCET/ROXICET) 5-325 MG tablet Take 1 tablet by mouth every 6 (six) hours as needed. (Patient not taking: Reported on 06/29/2017) 30 tablet 0  . potassium chloride 20 MEQ TBCR Take 10 mEq by mouth daily for 14 days. (Patient not taking: Reported on 07/27/2017) 14 tablet 0  . prochlorperazine (COMPAZINE) 10 MG tablet Take 1 tablet (10 mg total) by mouth every 6 (six) hours as needed for nausea or vomiting. (Patient not taking: Reported on 06/15/2017) 30 tablet 0  . promethazine (PHENERGAN) 12.5 MG tablet Take 1 tablet (12.5 mg total) by mouth every 8 (eight) hours as needed for nausea or vomiting. (Patient not taking: Reported on  06/15/2017) 10 tablet 0  . promethazine (PHENERGAN) 25 MG suppository Place 1 suppository (25 mg total) rectally every 8 (eight) hours as needed for nausea or vomiting. (Patient not taking: Reported on 06/15/2017) 12 each 0   No current facility-administered medications for this visit.    Facility-Administered Medications Ordered in Other Visits  Medication Dose Route Frequency Provider Last Rate Last Dose  . fluorouracil (ADRUCIL) 4,050 mg in sodium chloride 0.9 % 69 mL chemo infusion  2,400 mg/m2 (Treatment Plan Recorded) Intravenous 1 day or 1 dose Charlaine Dalton R, MD      . irinotecan (CAMPTOSAR) 260 mg in dextrose 5 % 500 mL chemo infusion  150 mg/m2 (Treatment Plan Recorded) Intravenous Once Cammie Sickle, MD 342 mL/hr at 07/27/17 1250 260 mg at 07/27/17 1250  . leucovorin 700 mg in dextrose 5 % 250 mL infusion  700 mg Intravenous Once Charlaine Dalton R, MD 190 mL/hr at 07/27/17 1250 700 mg at 07/27/17 1250  . sodium chloride flush (NS) 0.9 % injection 10 mL  10 mL Intravenous PRN  Cammie Sickle, MD   10 mL at 06/15/17 0941  . sodium chloride flush (NS) 0.9 % injection 10 mL  10 mL Intravenous PRN Cammie Sickle, MD   10 mL at 07/27/17 0826      .  PHYSICAL EXAMINATION: ECOG PERFORMANCE STATUS: 1 - Symptomatic but completely ambulatory  Vitals:   07/27/17 0842  BP: 113/76  Pulse: 69  Resp: 20  Temp: 97.9 F (36.6 C)   Filed Weights   07/27/17 0842  Weight: 151 lb 1.6 oz (68.5 kg)    GENERAL: Well-nourished well-developed; Alert, no distress and comfortable.   Alone. EYES: no pallor or icterus OROPHARYNX: no thrush or ulceration; poor dentition  NECK: supple, no masses felt LYMPH:  no palpable lymphadenopathy in the cervical, axillary or inguinal regions LUNGS: clear to auscultation and  No wheeze or crackles HEART/CVS: regular rate & rhythm and no murmurs; No lower extremity edema ABDOMEN: abdomen soft, non-tender and normal bowel  sounds Musculoskeletal:no cyanosis of digits and no clubbing  PSYCH: alert & oriented x 3 with fluent speech NEURO: no focal motor/sensory deficits SKIN:  no rashes or significant lesions  LABORATORY DATA:  I have reviewed the data as listed Lab Results  Component Value Date   WBC 13.0 (H) 07/27/2017   HGB 13.3 07/27/2017   HCT 38.7 (L) 07/27/2017   MCV 97.7 07/27/2017   PLT 256 07/27/2017   Recent Labs    03/24/17 1537  04/22/17 0443 04/26/17 0840  06/29/17 0911 07/13/17 0840 07/27/17 0830  NA  --    < > 137 136   < > 139 138 137  K  --    < > 3.8 3.3*   < > 3.7 4.0 3.7  CL  --    < > 109 103   < > 105 107 108  CO2  --    < > 22 27   < > _0 GLUCOSE  --    < > 94 158*   < > 150* 85 94  BUN  --    < > 8 10   < > _1 CREATININE  --    < > 0.64 0.74   < > 0.69 0.80 0.65  CALCIUM  --    < > 8.7* 8.9   < > 8.7* 8.8* 8.8*  GFRNONAA  --    < > >60 >60   < > >60 >60 >60  GFRAA  --    < > >60 >60   < > >60 >60 >60  PROT 8.0   < > 6.5 7.7   < > 6.7 6.9 6.8  ALBUMIN 4.2   < > 2.9* 3.6   < > 3.4* 3.6 3.6  AST 21   < > 198* 64*   < > _2 ALT 13*   < > 512* 237*   < > 15* 15* 12*  ALKPHOS 54   < > 417* 315*   < > 113 117 109  BILITOT 0.6   < > 6.5* 3.7*   < > 0.7 0.6 0.6  BILIDIR 0.1  --  3.0* 1.8*  --   --   --   --   IBILI 0.5  --  3.5* 1.9*  --   --   --   --    < > = values in this interval not displayed.    RADIOGRAPHIC STUDIES: I have personally reviewed the radiological  images as listed and agreed with the findings in the report. No results found. IMPRESSION: Pancreatic mass again noted compatible with patient's known history of pancreatic malignancy. Interval exchange of plastic biliary stent for a metallic stent. Decreasing biliary ductal dilatation. Mild pneumobilia.  Stable pancreatic ductal dilatation.  Stable scarring in the left lung base.   Electronically Signed   By: Rolm Baptise M.D.   On: 05/06/2017 20:58 ASSESSMENT & PLAN:    Malignant tumor of body of pancreas ([place]) # Pancreatic adenocarcinoma-borderline resectable; status post 4 cycle of neoadjuvant FOLFIRINOX; tolerated better. CT scan pancreas protocol shows slight shrinkage of the pancreatic mass. Discussed with Dr.Allen; at [place].  Awaiting re-val at [facility] this week with imaging.  # proceed with cycle # 7 of FOLFIRINOX today- Labs today reviewed;  acceptable for treatment today. Iri at 1103m/m2.    # ? Infusion reaction to iri at last cycle- add atropine/ pepcid to pre-meds.   # Pain control-better controlled; off  fentanyl patch 25 g and also Percocet.    # follow up in 2 weeks; labs FOLFIRINOX- onpro; pump off 2 days.    All questions were answered. The patient knows to call the clinic with any problems, questions or concerns.    GCammie Sickle MD 07/27/2017 1:10 PM

## 2017-07-29 ENCOUNTER — Inpatient Hospital Stay: Payer: Medicaid Other

## 2017-07-29 VITALS — BP 127/82 | HR 72 | Temp 97.2°F | Resp 18

## 2017-07-29 DIAGNOSIS — C251 Malignant neoplasm of body of pancreas: Secondary | ICD-10-CM

## 2017-07-29 DIAGNOSIS — Z5111 Encounter for antineoplastic chemotherapy: Secondary | ICD-10-CM | POA: Diagnosis not present

## 2017-07-29 MED ORDER — SODIUM CHLORIDE 0.9% FLUSH
10.0000 mL | INTRAVENOUS | Status: DC | PRN
  Administered 2017-07-29: 10 mL
  Filled 2017-07-29: qty 10

## 2017-07-29 MED ORDER — HEPARIN SOD (PORK) LOCK FLUSH 100 UNIT/ML IV SOLN
500.0000 [IU] | Freq: Once | INTRAVENOUS | Status: AC | PRN
  Administered 2017-07-29: 500 [IU]

## 2017-07-29 MED ORDER — PEGFILGRASTIM 6 MG/0.6ML ~~LOC~~ PSKT
6.0000 mg | PREFILLED_SYRINGE | Freq: Once | SUBCUTANEOUS | Status: AC
  Administered 2017-07-29: 6 mg via SUBCUTANEOUS

## 2017-07-31 ENCOUNTER — Emergency Department: Payer: Medicaid Other

## 2017-07-31 ENCOUNTER — Other Ambulatory Visit: Payer: Self-pay

## 2017-07-31 ENCOUNTER — Observation Stay
Admission: EM | Admit: 2017-07-31 | Discharge: 2017-08-01 | Disposition: A | Discharge status: Home / Self Care | Payer: Medicaid Other | Attending: Specialist | Admitting: Specialist

## 2017-07-31 DIAGNOSIS — A419 Sepsis, unspecified organism: Secondary | ICD-10-CM

## 2017-07-31 DIAGNOSIS — K831 Obstruction of bile duct: Secondary | ICD-10-CM | POA: Diagnosis not present

## 2017-07-31 DIAGNOSIS — J449 Chronic obstructive pulmonary disease, unspecified: Secondary | ICD-10-CM | POA: Insufficient documentation

## 2017-07-31 DIAGNOSIS — D72829 Elevated white blood cell count, unspecified: Secondary | ICD-10-CM | POA: Insufficient documentation

## 2017-07-31 DIAGNOSIS — J111 Influenza due to unidentified influenza virus with other respiratory manifestations: Secondary | ICD-10-CM | POA: Diagnosis present

## 2017-07-31 DIAGNOSIS — Z79899 Other long term (current) drug therapy: Secondary | ICD-10-CM | POA: Insufficient documentation

## 2017-07-31 DIAGNOSIS — Z87891 Personal history of nicotine dependence: Secondary | ICD-10-CM | POA: Diagnosis not present

## 2017-07-31 DIAGNOSIS — Z8507 Personal history of malignant neoplasm of pancreas: Secondary | ICD-10-CM | POA: Insufficient documentation

## 2017-07-31 DIAGNOSIS — B370 Candidal stomatitis: Secondary | ICD-10-CM | POA: Insufficient documentation

## 2017-07-31 DIAGNOSIS — J40 Bronchitis, not specified as acute or chronic: Secondary | ICD-10-CM

## 2017-07-31 DIAGNOSIS — J101 Influenza due to other identified influenza virus with other respiratory manifestations: Secondary | ICD-10-CM | POA: Diagnosis not present

## 2017-07-31 LAB — CBC
HCT: 43.1 % (ref 40.0–52.0)
HEMOGLOBIN: 14.4 g/dL (ref 13.0–18.0)
MCH: 31.9 pg (ref 26.0–34.0)
MCHC: 33.4 g/dL (ref 32.0–36.0)
MCV: 95.5 fL (ref 80.0–100.0)
Platelets: 237 10*3/uL (ref 150–440)
RBC: 4.51 MIL/uL (ref 4.40–5.90)
RDW: 14.2 % (ref 11.5–14.5)
WBC: 37.4 10*3/uL — ABNORMAL HIGH (ref 3.8–10.6)

## 2017-07-31 LAB — COMPREHENSIVE METABOLIC PANEL
ALK PHOS: 138 U/L — AB (ref 38–126)
ALT: 31 U/L (ref 17–63)
ANION GAP: 9 (ref 5–15)
AST: 31 U/L (ref 15–41)
Albumin: 4.1 g/dL (ref 3.5–5.0)
BUN: 10 mg/dL (ref 6–20)
CALCIUM: 9 mg/dL (ref 8.9–10.3)
CO2: 23 mmol/L (ref 22–32)
Chloride: 102 mmol/L (ref 101–111)
Creatinine, Ser: 0.79 mg/dL (ref 0.61–1.24)
GFR calc Af Amer: >60 mL/min (ref >60)
GFR calc non Af Amer: >60 mL/min (ref >60)
Glucose, Bld: 103 mg/dL — ABNORMAL HIGH (ref 65–99)
Potassium: 3.8 mmol/L (ref 3.5–5.1)
SODIUM: 134 mmol/L — AB (ref 135–145)
TOTAL PROTEIN: 7.8 g/dL (ref 6.5–8.1)
Total Bilirubin: 1 mg/dL (ref 0.3–1.2)

## 2017-07-31 LAB — URINALYSIS, COMPLETE (UACMP) WITH MICROSCOPIC
BACTERIA UA: NONE SEEN
Bilirubin Urine: NEGATIVE
Glucose, UA: NEGATIVE mg/dL
Ketones, ur: NEGATIVE mg/dL
Leukocytes, UA: NEGATIVE
NITRITE: NEGATIVE
PH: 6 (ref 5.0–8.0)
Protein, ur: 30 mg/dL — AB
SPECIFIC GRAVITY, URINE: 1.028 (ref 1.005–1.030)
SQUAMOUS EPITHELIAL / LPF: NONE SEEN

## 2017-07-31 LAB — DIFFERENTIAL
BASOS ABS: 0 10*3/uL (ref 0–0.1)
BASOS PCT: 0 %
EOS PCT: 0 %
Eosinophils Absolute: 0 10*3/uL (ref 0–0.7)
Lymphocytes Relative: 3 %
Lymphs Abs: 1.1 10*3/uL (ref 1.0–3.6)
Monocytes Absolute: 0.8 10*3/uL (ref 0.2–1.0)
Monocytes Relative: 2 %
NEUTROS PCT: 95 %
Neutro Abs: 34.4 10*3/uL — ABNORMAL HIGH (ref 1.4–6.5)

## 2017-07-31 LAB — INFLUENZA PANEL BY PCR (TYPE A & B)
INFLAPCR: POSITIVE — AB
INFLBPCR: NEGATIVE

## 2017-07-31 LAB — LACTIC ACID, PLASMA
LACTIC ACID, VENOUS: 1.8 mmol/L (ref 0.5–1.9)
LACTIC ACID, VENOUS: 3.4 mmol/L — AB (ref 0.5–1.9)

## 2017-07-31 LAB — LIPASE, BLOOD: Lipase: 23 U/L (ref 11–51)

## 2017-07-31 LAB — TROPONIN I
TROPONIN I: 0.03 ng/mL — AB (ref <0.03)
TROPONIN I: 0.05 ng/mL — AB (ref <0.03)

## 2017-07-31 MED ORDER — ALBUTEROL SULFATE HFA 108 (90 BASE) MCG/ACT IN AERS: 1.0000 | INHALATION_SPRAY | Freq: Four times a day (QID) | RESPIRATORY_TRACT | Status: DC | PRN

## 2017-07-31 MED ORDER — METHYLPREDNISOLONE SODIUM SUCC 125 MG IJ SOLR
125.0000 mg | Freq: Once | INTRAMUSCULAR | Status: AC
  Administered 2017-07-31: 125 mg via INTRAVENOUS
  Filled 2017-07-31: qty 2

## 2017-07-31 MED ORDER — CEFEPIME HCL 1 G IJ SOLR
1.0000 g | Freq: Once | INTRAMUSCULAR | Status: AC
  Administered 2017-07-31: 1 g via INTRAVENOUS
  Filled 2017-07-31: qty 1

## 2017-07-31 MED ORDER — LOPERAMIDE HCL 2 MG PO CAPS: 2.0000 mg | ORAL_CAPSULE | ORAL | Status: DC | PRN

## 2017-07-31 MED ORDER — SODIUM CHLORIDE 0.9 % IV SOLN
INTRAVENOUS | Status: DC
  Administered 2017-07-31 – 2017-08-01 (×2): via INTRAVENOUS

## 2017-07-31 MED ORDER — IPRATROPIUM-ALBUTEROL 0.5-2.5 (3) MG/3ML IN SOLN
3.0000 mL | Freq: Once | RESPIRATORY_TRACT | Status: AC
  Administered 2017-07-31: 3 mL via RESPIRATORY_TRACT

## 2017-07-31 MED ORDER — IPRATROPIUM-ALBUTEROL 0.5-2.5 (3) MG/3ML IN SOLN
3.0000 mL | Freq: Once | RESPIRATORY_TRACT | Status: AC
  Administered 2017-07-31: 3 mL via RESPIRATORY_TRACT
  Filled 2017-07-31: qty 6

## 2017-07-31 MED ORDER — PROMETHAZINE HCL 25 MG PO TABS
12.5000 mg | ORAL_TABLET | Freq: Three times a day (TID) | ORAL | Status: DC | PRN
  Administered 2017-07-31: 19:00:00 12.5 mg via ORAL
  Filled 2017-07-31 (×2): qty 1

## 2017-07-31 MED ORDER — OSELTAMIVIR PHOSPHATE 75 MG PO CAPS
75.0000 mg | ORAL_CAPSULE | Freq: Two times a day (BID) | ORAL | Status: DC
  Administered 2017-07-31 – 2017-08-01 (×2): 75 mg via ORAL
  Filled 2017-07-31 (×3): qty 1

## 2017-07-31 MED ORDER — ONDANSETRON 4 MG PO TBDP
4.0000 mg | ORAL_TABLET | Freq: Three times a day (TID) | ORAL | Status: DC | PRN
  Filled 2017-07-31: qty 1

## 2017-07-31 MED ORDER — SODIUM CHLORIDE 0.9 % IV SOLN: INTRAVENOUS | Status: DC

## 2017-07-31 MED ORDER — IPRATROPIUM-ALBUTEROL 0.5-2.5 (3) MG/3ML IN SOLN: 3.0000 mL | Freq: Four times a day (QID) | RESPIRATORY_TRACT | Status: DC | PRN

## 2017-07-31 MED ORDER — ACETAMINOPHEN 500 MG PO TABS
1000.0000 mg | ORAL_TABLET | Freq: Once | ORAL | Status: AC
  Administered 2017-07-31: 1000 mg via ORAL
  Filled 2017-07-31: qty 2

## 2017-07-31 MED ORDER — OXYCODONE-ACETAMINOPHEN 5-325 MG PO TABS
1.0000 | ORAL_TABLET | Freq: Four times a day (QID) | ORAL | Status: DC | PRN
  Administered 2017-07-31: 22:00:00 1 via ORAL
  Filled 2017-07-31: qty 1

## 2017-07-31 MED ORDER — SODIUM CHLORIDE 0.9 % IV BOLUS (SEPSIS)
1000.0000 mL | Freq: Once | INTRAVENOUS | Status: AC
  Administered 2017-07-31: 1000 mL via INTRAVENOUS

## 2017-07-31 MED ORDER — VANCOMYCIN HCL IN DEXTROSE 1-5 GM/200ML-% IV SOLN
1000.0000 mg | Freq: Once | INTRAVENOUS | Status: AC
  Administered 2017-07-31: 1000 mg via INTRAVENOUS
  Filled 2017-07-31: qty 200

## 2017-07-31 MED ORDER — ENOXAPARIN SODIUM 40 MG/0.4ML ~~LOC~~ SOLN
40.0000 mg | SUBCUTANEOUS | Status: DC
  Administered 2017-07-31: 40 mg via SUBCUTANEOUS
  Filled 2017-07-31: qty 0.4

## 2017-07-31 MED ORDER — OSELTAMIVIR PHOSPHATE 75 MG PO CAPS
75.0000 mg | ORAL_CAPSULE | Freq: Once | ORAL | Status: AC
  Administered 2017-07-31: 75 mg via ORAL
  Filled 2017-07-31: qty 1

## 2017-07-31 NOTE — ED Provider Notes (Signed)
[facility] Emergency Department Provider Note  ____________________________________________  Time seen: Approximately 3:55 PM  I have reviewed the triage vital signs and the nursing notes.   HISTORY  Chief Complaint Abdominal Pain and Fever   HPI Nathaniel Saunders is a 47 y.o. male with a history of stage II pancreatic cancer currently on chemotherapy and COPD who presents for evaluation of fever. Patient reports that his symptoms started 2 days ago after coming to the hospital for a CT scan. He has had fever, moderate cough productive of yellow sputum, constant mild shortness of breath, several daily episodes of nonbloody nonbilious emesis. He is also complaining of pain in his epigastric region which has been constant, dull, and moderate.He reports that this pain is chronic for him. No CP, leg swelling, hemoptysis, dysuria, sore throat, diarrhea.  Past Medical History:  Diagnosis Date  . Asthma   . Genetic testing 05/20/2017   Multi-Cancer panel (83 genes) @ [facility] - No pathogenic mutations detected  . Pancreatic mass   . Pancreatic mass     Patient Active Problem List   Diagnosis Date Noted  . Genetic testing 05/20/2017  . Pancreatic mass   . Obstruction of bile duct   . Port-A-Cath in place 04/29/2017  . Biliary obstruction 04/29/2017  . Obstructive jaundice due to malignant neoplasm ([place]) 04/21/2017  . Malignant neoplasm of pancreas ([place])   . Extrahepatic obstructive biliary disease   . Malignant tumor of body of pancreas ([place]) 03/24/2017    Past Surgical History:  Procedure Laterality Date  . ERCP N/A 04/21/2017   Procedure: ENDOSCOPIC RETROGRADE CHOLANGIOPANCREATOGRAPHY (ERCP);  Surgeon: Lucilla Lame, MD;  Location: [facility] ENDOSCOPY;  Service: Endoscopy;  Laterality: N/A;  . ERCP N/A 05/06/2017   Procedure: ENDOSCOPIC RETROGRADE CHOLANGIOPANCREATOGRAPHY (ERCP);  Surgeon: Lucilla Lame, MD;  Location: [facility] ENDOSCOPY;  Service: Endoscopy;   Laterality: N/A;  . PORTA CATH INSERTION N/A 04/18/2017   Procedure: PORTA CATH INSERTION;  Surgeon: Algernon Huxley, MD;  Location: [place] [facility] LAB;  Service: Cardiovascular;  Laterality: N/A;    Prior to Admission medications   Medication Sig Start Date End Date Taking? Authorizing Provider  albuterol (PROVENTIL HFA;VENTOLIN HFA) 108 (90 Base) MCG/ACT inhaler Inhale 1-2 puffs every 6 (six) hours as needed into the lungs for wheezing or shortness of breath.    [provider]  lidocaine-prilocaine (EMLA) cream Apply 1 application topically as needed. Apply generously over the Mediport 45 minutes prior to chemotherapy. Patient not taking: Reported on 07/31/2017 04/12/17   Cammie Sickle, MD  loperamide (IMODIUM) 2 MG capsule Take 1 capsule (2 mg total) by mouth as needed for diarrhea or loose stools. Patient not taking: Reported on 05/25/2017 04/29/17   Jacquelin Hawking, NP  ondansetron (ZOFRAN ODT) 4 MG disintegrating tablet Take 1 tablet (4 mg total) by mouth every 8 (eight) hours as needed for nausea or vomiting. Patient not taking: Reported on 05/25/2017 05/06/17   Eula Listen, MD  ondansetron (ZOFRAN) 8 MG tablet One pill every 8 hours as needed for nausea/vomitting. Patient not taking: Reported on 05/25/2017 04/12/17   Cammie Sickle, MD  oxyCODONE-acetaminophen (PERCOCET/ROXICET) 5-325 MG tablet Take 1 tablet by mouth every 6 (six) hours as needed. Patient not taking: Reported on 06/29/2017 04/29/17   Jacquelin Hawking, NP  potassium chloride 20 MEQ TBCR Take 10 mEq by mouth daily for 14 days. Patient not taking: Reported on 07/27/2017 05/04/17 06/15/37  Jacquelin Hawking, NP  prochlorperazine (COMPAZINE) 10  MG tablet Take 1 tablet (10 mg total) by mouth every 6 (six) hours as needed for nausea or vomiting. Patient not taking: Reported on 06/15/2017 04/29/17   Jacquelin Hawking, NP  promethazine (PHENERGAN) 12.5 MG tablet Take 1 tablet (12.5 mg total) by mouth every 8  (eight) hours as needed for nausea or vomiting. Patient not taking: Reported on 06/15/2017 05/06/17   Eula Listen, MD  promethazine (PHENERGAN) 25 MG suppository Place 1 suppository (25 mg total) rectally every 8 (eight) hours as needed for nausea or vomiting. Patient not taking: Reported on 06/15/2017 05/06/17   Eula Listen, MD    Allergies Patient has no known allergies.  Family History  Problem Relation Age of Onset  . Pancreatic cancer Mother 39       deceased 47  . Diabetes Father   . Colon cancer Maternal Grandmother 48       deceased 21    Social History Social History   Tobacco Use  . Smoking status: Former Smoker    Packs/day: 0.25    Years: 5.00    Pack years: 1.25    Types: Cigarettes    Last attempt to quit: 05/07/2015    Years since quitting: 2.2  . Smokeless tobacco: Never Used  Substance Use Topics  . Alcohol use: No  . Drug use: No    Review of Systems  Constitutional: + fever. Eyes: Negative for visual changes. ENT: Negative for sore throat. Neck: No neck pain  Cardiovascular: Negative for chest pain. Respiratory: + shortness of breath and cough Gastrointestinal: + epigastric abdominal pain, nausea, and vomiting. No diarrhea. Genitourinary: Negative for dysuria. Musculoskeletal: Negative for back pain. Skin: Negative for rash. Neurological: Negative for headaches, weakness or numbness. Psych: No SI or HI  ____________________________________________   PHYSICAL EXAM:  VITAL SIGNS: ED Triage Vitals  Enc Vitals Group     BP 07/31/17 1530 114/70     Pulse Rate 07/31/17 1530 (!) 103     Resp 07/31/17 1530 (!) 22     Temp 07/31/17 1530 (!) 102.2 F (39 C)     Temp Source 07/31/17 1530 Oral     SpO2 07/31/17 1530 93 %     Weight --      Height --      Head Circumference --      Peak Flow --      Pain Score 07/31/17 1527 10     Pain Loc --      Pain Edu? --      Excl. in [place]? --     Constitutional: Alert and  oriented. Well appearing and in no apparent distress. HEENT:      Head: Normocephalic and atraumatic.         Eyes: Conjunctivae are normal. Sclera is non-icteric.       Mouth/Throat: Mucous membranes are moist.       Neck: Supple with no signs of meningismus. Cardiovascular: Tachycardic with regular rhythm. No murmurs, gallops, or rubs. 2+ symmetrical distal pulses are present in all extremities. No JVD. Respiratory: Slightly increased work of breathing with tachypnea, satting 93% on room air, decreased breath sounds bilaterally with no crackles or wheezes.  Gastrointestinal: Soft, non tender, and non distended with positive bowel sounds. No rebound or guarding. Musculoskeletal: Nontender with normal range of motion in all extremities. No edema, cyanosis, or erythema of extremities. Neurologic: Normal speech and language. Face is symmetric. Moving all extremities. No gross focal neurologic deficits are appreciated. Skin: Skin is  warm, dry and intact. No rash noted. Psychiatric: Mood and affect are normal. Speech and behavior are normal.  ____________________________________________   LABS (all labs ordered are listed, but only abnormal results are displayed)  Labs Reviewed  COMPREHENSIVE METABOLIC PANEL - Abnormal; Notable for the following components:      Result Value   Sodium 134 (*)    Glucose, Bld 103 (*)    Alkaline Phosphatase 138 (*)    All other components within normal limits  CBC - Abnormal; Notable for the following components:   WBC 37.4 (*)    All other components within normal limits  TROPONIN I - Abnormal; Notable for the following components:   Troponin I 0.03 (*)    All other components within normal limits  INFLUENZA PANEL BY PCR (TYPE A & B) - Abnormal; Notable for the following components:   Influenza A By PCR POSITIVE (*)    All other components within normal limits  DIFFERENTIAL - Abnormal; Notable for the following components:   Neutro Abs 34.4 (*)    All  other components within normal limits  CULTURE, BLOOD (ROUTINE X 2)  CULTURE, BLOOD (ROUTINE X 2)  URINE CULTURE  LIPASE, BLOOD  LACTIC ACID, PLASMA  URINALYSIS, COMPLETE (UACMP) WITH MICROSCOPIC  LACTIC ACID, PLASMA  CBC WITH DIFFERENTIAL/PLATELET   ____________________________________________  EKG  ED ECG REPORT I, Rudene Re, the attending physician, personally viewed and interpreted this ECG.  Normal sinus rhythm, rate of 93, normal intervals, normal axis, no ST elevations or depressions. ____________________________________________  RADIOLOGY  I have personally reviewed the images performed during this visit and I agree with the Radiologist's read.   Interpretation by Radiologist:  Dg Chest 2 View  Result Date: 07/31/2017 CLINICAL DATA:  Chemotherapy 2 days ago for pancreatic carcinoma. Fever, cough and shortness of breath since. EXAM: CHEST - 2 VIEW COMPARISON:  Chest CT, 04/15/2017 FINDINGS: Cardiac silhouette is normal in size. No mediastinal or hilar masses. No convincing adenopathy. Chronic areas of lung scarring. Lungs are hyperexpanded. No evidence of pneumonia or pulmonary edema. Right anterior chest wall Port-A-Cath has its tip in the lower superior vena cava. No pleural effusion or pneumothorax. Skeletal structures are intact. IMPRESSION: 1. No acute cardiopulmonary disease. 2. Areas of lung scarring stable when compared to the prior chest CT. Electronically Signed   By: Lajean Manes M.D.   On: 07/31/2017 16:17      ____________________________________________   PROCEDURES  Procedure(s) performed: None Procedures Critical Care performed: yes  CRITICAL CARE Performed by: Rudene Re  ?  Total critical care time: 35 min  Critical care time was exclusive of separately billable procedures and treating other patients.  Critical care was necessary to treat or prevent imminent or life-threatening deterioration.  Critical care was time spent  personally by me on the following activities: development of treatment plan with patient and/or surrogate as well as nursing, discussions with consultants, evaluation of patient's response to treatment, examination of patient, obtaining history from patient or surrogate, ordering and performing treatments and interventions, ordering and review of laboratory studies, ordering and review of radiographic studies, pulse oximetry and re-evaluation of patient's condition.  ____________________________________________   INITIAL IMPRESSION / ASSESSMENT AND PLAN / ED COURSE  47 y.o. male with a history of stage II pancreatic cancer currently on chemotherapy and COPD who presents for evaluation of fever, cough, sob, nausea, vomiting x 2 days. Patient is well-appearing, slightly increased work of breathing, fever of 102.2, tachycardic with a pulse of 103,  tachypnea to 22 satting 93% on room air, normotensive, decreased air movement bilaterally with no crackles or wheezes, no meningeal signs, abdomen is soft with no tenderness throughout. Presentation concerning for sepsis from possible pneumonia versus flu. We'll start patient on cefepime and vancomycin, IV fluids, DuoNeb labs, steroids, Tylenol. Anticipate admission. Labs and imaging pending.    _________________________ 4:44 PM on 07/31/2017 -----------------------------------------  CXR negative for pneumonia. Flu positive. Patient was started on Tamiflu. Since patient's white count is extremely elevated, mostly neutrophils and patient has a port I believe patient warrants admission for IV abx and monitoring. Will discuss with Hospitalist for admission.   As part of my medical decision making, I reviewed the following data within the [place] notes reviewed and incorporated, Labs reviewed , EKG interpreted , Radiograph reviewed , Discussed with admitting physician , Notes from prior ED visits and [place] Controlled Substance  Database    Pertinent labs & imaging results that were available during my care of the patient were reviewed by me and considered in my medical decision making (see chart for details).    ____________________________________________   FINAL CLINICAL IMPRESSION(S) / ED DIAGNOSES  Final diagnoses:  Sepsis, due to unspecified organism ([place])  Bronchitis  Influenza A      NEW MEDICATIONS STARTED DURING THIS VISIT:  ED Discharge Orders    None       Note:  This document was prepared using Dragon voice recognition software and may include unintentional dictation errors.    Rudene Re, MD 07/31/17 [id number]

## 2017-07-31 NOTE — Progress Notes (Signed)
CRITICAL VALUE STICKER  CRITICAL VALUE: Lactic 3.4  RECEIVER (on-site recipient of call):Kristel Durkee Arthur NOTIFIED: 3/10 8:35 PM  MESSENGER (representative from lab):  MD NOTIFIED: [place]: 8:35 PM  RESPONSE: pending

## 2017-07-31 NOTE — H&P (Signed)
[facility] [facility] [place] NAME: Nathaniel Saunders    MR#:  [id number]  DATE OF BIRTH:  1970/12/11  DATE OF ADMISSION:  07/31/2017  PRIMARY CARE PHYSICIAN: Maeola Sarah, MD   REQUESTING/REFERRING PHYSICIAN: Rudene Re, MD  CHIEF COMPLAINT:   Chief Complaint  Patient presents with  . Abdominal Pain  . Fever    HISTORY OF PRESENT ILLNESS: Nathaniel Saunders  is a 47 y.o. male with a known history of cancer who is concurrently undergoing chemo who had his chemo last Wednesday presenting to the emergency room with complaint of having fever nausea vomiting.  His wife actually had flu last week.  Patient since Friday has been having fevers and chills.  When he came to the emergency room he was noted to have a leukocytosis and flu positive.  He is tachycardic therefore were asked to admit the patient.  He does report chronic abdominal pain nausea vomiting which has been unchanged.  He also reports that he normally gets a medication to help his white blood cell after chemo which he did receive post this chemo.  His white blood cell count is noted to be 34,000 today.  PAST MEDICAL HISTORY:   Past Medical History:  Diagnosis Date  . Asthma   . Genetic testing 05/20/2017   Multi-Cancer panel (83 genes) @ [facility] - No pathogenic mutations detected  . Pancreatic mass   . Pancreatic mass     PAST SURGICAL HISTORY:  Past Surgical History:  Procedure Laterality Date  . ERCP N/A 04/21/2017   Procedure: ENDOSCOPIC RETROGRADE CHOLANGIOPANCREATOGRAPHY (ERCP);  Surgeon: Lucilla Lame, MD;  Location: [facility] ENDOSCOPY;  Service: Endoscopy;  Laterality: N/A;  . ERCP N/A 05/06/2017   Procedure: ENDOSCOPIC RETROGRADE CHOLANGIOPANCREATOGRAPHY (ERCP);  Surgeon: Lucilla Lame, MD;  Location: [facility] ENDOSCOPY;  Service: Endoscopy;  Laterality: N/A;  . PORTA CATH INSERTION N/A 04/18/2017   Procedure: PORTA CATH INSERTION;  Surgeon: Algernon Huxley, MD;  Location: [facility]  LAB;  Service: Cardiovascular;  Laterality: N/A;    SOCIAL HISTORY:  Social History   Tobacco Use  . Smoking status: Former Smoker    Packs/day: 0.25    Years: 5.00    Pack years: 1.25    Types: Cigarettes    Last attempt to quit: 05/07/2015    Years since quitting: 2.2  . Smokeless tobacco: Never Used  Substance Use Topics  . Alcohol use: No    FAMILY HISTORY:  Family History  Problem Relation Age of Onset  . Pancreatic cancer Mother 70       deceased 22  . Diabetes Father   . Colon cancer Maternal Grandmother 70       deceased 51    DRUG ALLERGIES: No Known Allergies  REVIEW OF SYSTEMS:   CONSTITUTIONAL: Positive fever, positive fatigue or positive weakness.  EYES: No blurred or double vision.  EARS, NOSE, AND THROAT: No tinnitus or ear pain.  RESPIRATORY: No cough, shortness of breath, wheezing or hemoptysis.  CARDIOVASCULAR: No chest pain, orthopnea, edema.  GASTROINTESTINAL: Chronic nausea, vomiting, and abdominal pain.  GENITOURINARY: No dysuria, hematuria.  ENDOCRINE: No polyuria, nocturia,  HEMATOLOGY: No anemia, easy bruising or bleeding SKIN: No rash or lesion. MUSCULOSKELETAL: No joint pain or arthritis.   NEUROLOGIC: No tingling, numbness, weakness.  PSYCHIATRY: No anxiety or depression.   MEDICATIONS AT HOME:  Prior to Admission medications   Medication Sig Start Date End Date Taking? Authorizing Provider  albuterol (PROVENTIL HFA;VENTOLIN HFA) 108 (90  Base) MCG/ACT inhaler Inhale 1-2 puffs every 6 (six) hours as needed into the lungs for wheezing or shortness of breath.    [provider]  lidocaine-prilocaine (EMLA) cream Apply 1 application topically as needed. Apply generously over the Mediport 45 minutes prior to chemotherapy. Patient not taking: Reported on 07/31/2017 04/12/17   Cammie Sickle, MD  loperamide (IMODIUM) 2 MG capsule Take 1 capsule (2 mg total) by mouth as needed for diarrhea or loose stools. Patient not taking:  Reported on 05/25/2017 04/29/17   Jacquelin Hawking, NP  ondansetron (ZOFRAN ODT) 4 MG disintegrating tablet Take 1 tablet (4 mg total) by mouth every 8 (eight) hours as needed for nausea or vomiting. Patient not taking: Reported on 05/25/2017 05/06/17   Eula Listen, MD  ondansetron (ZOFRAN) 8 MG tablet One pill every 8 hours as needed for nausea/vomitting. Patient not taking: Reported on 05/25/2017 04/12/17   Cammie Sickle, MD  oxyCODONE-acetaminophen (PERCOCET/ROXICET) 5-325 MG tablet Take 1 tablet by mouth every 6 (six) hours as needed. Patient not taking: Reported on 06/29/2017 04/29/17   Jacquelin Hawking, NP  potassium chloride 20 MEQ TBCR Take 10 mEq by mouth daily for 14 days. Patient not taking: Reported on 07/27/2017 05/04/17 06/15/37  Jacquelin Hawking, NP  prochlorperazine (COMPAZINE) 10 MG tablet Take 1 tablet (10 mg total) by mouth every 6 (six) hours as needed for nausea or vomiting. Patient not taking: Reported on 06/15/2017 04/29/17   Jacquelin Hawking, NP  promethazine (PHENERGAN) 12.5 MG tablet Take 1 tablet (12.5 mg total) by mouth every 8 (eight) hours as needed for nausea or vomiting. Patient not taking: Reported on 06/15/2017 05/06/17   Eula Listen, MD  promethazine (PHENERGAN) 25 MG suppository Place 1 suppository (25 mg total) rectally every 8 (eight) hours as needed for nausea or vomiting. Patient not taking: Reported on 06/15/2017 05/06/17   Eula Listen, MD      PHYSICAL EXAMINATION:   VITAL SIGNS: Blood pressure 121/72, pulse (!) 112, temperature (!) 101.3 F (38.5 C), temperature source Oral, resp. rate (!) 44, SpO2 98 %.  GENERAL:  47 y.o.-year-old patient lying in the bed with no acute distress.  EYES: Pupils equal, round, reactive to light and accommodation. No scleral icterus. Extraocular muscles intact.  HEENT: Head atraumatic, normocephalic. Oropharynx and nasopharynx clear.  NECK:  Supple, no jugular venous distention. No thyroid  enlargement, no tenderness.  LUNGS: Normal breath sounds bilaterally, no wheezing, rales,rhonchi or crepitation. No use of accessory muscles of respiration.  CARDIOVASCULAR: S1, S2 tachycardic. No murmurs, rubs, or gallops.  ABDOMEN: Soft, mild epigastric tenderness, nondistended. Bowel sounds present. No organomegaly or mass.  EXTREMITIES: No pedal edema, cyanosis, or clubbing.  NEUROLOGIC: Cranial nerves II through XII are intact. Muscle strength 5/5 in all extremities. Sensation intact. Gait not checked.  PSYCHIATRIC: The patient is alert and oriented x 3.  SKIN: No obvious rash, lesion, or ulcer.   LABORATORY PANEL:   CBC Recent Labs  Lab 07/27/17 0830 07/31/17 1534  WBC 13.0* 37.4*  HGB 13.3 14.4  HCT 38.7* 43.1  PLT 256 237  MCV 97.7 95.5  MCH 33.5 31.9  MCHC 34.3 33.4  RDW 15.3* 14.2  LYMPHSABS 2.4 1.1  MONOABS 1.0 0.8  EOSABS 0.2 0.0  BASOSABS 0.1 0.0   ------------------------------------------------------------------------------------------------------------------  Chemistries  Recent Labs  Lab 07/27/17 0830 07/31/17 1534  NA 137 134*  K 3.7 3.8  CL 108 102  CO2 24 23  GLUCOSE 94 103*  BUN  11 10  CREATININE 0.65 0.79  CALCIUM 8.8* 9.0  AST 17 31  ALT 12* 31  ALKPHOS 109 138*  BILITOT 0.6 1.0   ------------------------------------------------------------------------------------------------------------------ estimated creatinine clearance is 101.4 mL/min (by C-G formula based on SCr of 0.79 mg/dL). ------------------------------------------------------------------------------------------------------------------ No results for input(s): TSH, T4TOTAL, T3FREE, THYROIDAB in the last 72 hours.  Invalid input(s): FREET3   Coagulation profile No results for input(s): INR, PROTIME in the last 168 hours. ------------------------------------------------------------------------------------------------------------------- No results for input(s): DDIMER in the  last 72 hours. -------------------------------------------------------------------------------------------------------------------  Cardiac Enzymes Recent Labs  Lab 07/31/17 1534  TROPONINI 0.03*   ------------------------------------------------------------------------------------------------------------------ Invalid input(s): POCBNP  ---------------------------------------------------------------------------------------------------------------  Urinalysis    Component Value Date/Time   COLORURINE YELLOW (A) 07/31/2017 1546   APPEARANCEUR CLEAR (A) 07/31/2017 1546   LABSPEC 1.028 07/31/2017 1546   PHURINE 6.0 07/31/2017 1546   GLUCOSEU NEGATIVE 07/31/2017 1546   HGBUR SMALL (A) 07/31/2017 1546   BILIRUBINUR NEGATIVE 07/31/2017 1546   KETONESUR NEGATIVE 07/31/2017 1546   PROTEINUR 30 (A) 07/31/2017 1546   NITRITE NEGATIVE 07/31/2017 1546   LEUKOCYTESUR NEGATIVE 07/31/2017 1546     RADIOLOGY: Dg Chest 2 View  Result Date: 07/31/2017 CLINICAL DATA:  Chemotherapy 2 days ago for pancreatic carcinoma. Fever, cough and shortness of breath since. EXAM: CHEST - 2 VIEW COMPARISON:  Chest CT, 04/15/2017 FINDINGS: Cardiac silhouette is normal in size. No mediastinal or hilar masses. No convincing adenopathy. Chronic areas of lung scarring. Lungs are hyperexpanded. No evidence of pneumonia or pulmonary edema. Right anterior chest wall Port-A-Cath has its tip in the lower superior vena cava. No pleural effusion or pneumothorax. Skeletal structures are intact. IMPRESSION: 1. No acute cardiopulmonary disease. 2. Areas of lung scarring stable when compared to the prior chest CT. Electronically Signed   By: Lajean Manes M.D.   On: 07/31/2017 16:17    EKG: Orders placed or performed during the hospital encounter of 07/31/17  . ED EKG  . ED EKG    IMPRESSION AND PLAN: Patient is 47 year old African-American male with pancreatic cancer presenting with fever  1.  Influenza A Supportive  care IV fluids Tamiflu  2.  Elevated WBC count likely due to a combination of #1 as well as patient receiving medication to help his white blood cells post chemo If continues to be elevated will need oncology input  3.  Pancreatic cancer therapy per oncology outpatient follow-up  4.  History of asthma currently asymptomatic  5.  Miscellaneous Lovenox for DVT prophylaxis    All the records are reviewed and case discussed with ED provider. Management plans discussed with the patient, family and they are in agreement.  CODE STATUS: Code Status History    Date Active Date Inactive Code Status Order ID Comments User Context   04/29/2017 17:29 04/30/2017 19:59 Full Code [id number]  Vaughan Basta, MD Inpatient   04/21/2017 13:22 04/22/2017 15:47 Full Code [id number]  Epifanio Lesches, MD ED    Advance Directive Documentation     Most Recent Value  Type of Advance Directive  Healthcare Power of Attorney, Living will  Pre-existing out of facility DNR order (yellow form or pink MOST form)  No data  "MOST" Form in Place?  No data       TOTAL TIME TAKING CARE OF THIS PATIENT: 8minutes.    Dustin Flock M.D on 07/31/2017 at 5:13 PM  Between 7am to 6pm - Pager - [phone]  After 6pm go to [email or web address] - Proofreader  [employer] Office  [phone]  CC: Primary care physician; Maeola Sarah, MD

## 2017-07-31 NOTE — ED Triage Notes (Addendum)
Pt is pancreatic CA pt. Receiving chemo. Has R chest port. States fever and cough, SOB x 3 days. productive cough, clear and yellow. States SOB better when he lays down. Pt points to LUQ when asked where abd pain is but states "it's in my pancreas" states his girlfriend checked temp at home and states 105. Did NOT take tylenol or motrin.

## 2017-07-31 NOTE — ED Notes (Signed)
Admitting MD at bedside at this time.

## 2017-07-31 NOTE — ED Notes (Signed)
Code sepsis called to [place]

## 2017-07-31 NOTE — Progress Notes (Signed)
CODE SEPSIS - PHARMACY COMMUNICATION  **Broad Spectrum Antibiotics should be administered within 1 hour of Sepsis diagnosis**  Time Code Sepsis Called/Page Received: 1600  Antibiotics Ordered: Cefepime and Vancomycin   Time of 1st antibiotic administration: 1612 Cefepime  Additional action taken by pharmacy: N/a  If necessary, Name of Provider/Nurse Contacted: N/A    Lendon Ka, PharmD Pharmacy Resident  07/31/2017  4:07 PM

## 2017-08-01 LAB — URINE CULTURE: Culture: NO GROWTH

## 2017-08-01 LAB — BASIC METABOLIC PANEL
ANION GAP: 11 (ref 5–15)
BUN: 12 mg/dL (ref 6–20)
CALCIUM: 8.7 mg/dL — AB (ref 8.9–10.3)
CO2: 20 mmol/L — ABNORMAL LOW (ref 22–32)
Chloride: 105 mmol/L (ref 101–111)
Creatinine, Ser: 0.8 mg/dL (ref 0.61–1.24)
Glucose, Bld: 126 mg/dL — ABNORMAL HIGH (ref 65–99)
Potassium: 4.2 mmol/L (ref 3.5–5.1)
SODIUM: 136 mmol/L (ref 135–145)

## 2017-08-01 LAB — CBC
HCT: 37.8 % — ABNORMAL LOW (ref 40.0–52.0)
Hemoglobin: 13 g/dL (ref 13.0–18.0)
MCH: 33 pg (ref 26.0–34.0)
MCHC: 34.5 g/dL (ref 32.0–36.0)
MCV: 95.8 fL (ref 80.0–100.0)
PLATELETS: 214 10*3/uL (ref 150–440)
RBC: 3.95 MIL/uL — ABNORMAL LOW (ref 4.40–5.90)
RDW: 14.4 % (ref 11.5–14.5)
WBC: 34.2 10*3/uL — AB (ref 3.8–10.6)

## 2017-08-01 MED ORDER — NYSTATIN 100000 UNIT/ML MT SUSP: 5.0000 mL | Freq: Four times a day (QID) | OROMUCOSAL | 0 refills | Status: AC

## 2017-08-01 MED ORDER — ENSURE ENLIVE PO LIQD
237.0000 mL | Freq: Two times a day (BID) | ORAL | Status: DC
  Administered 2017-08-01: 10:00:00 237 mL via ORAL

## 2017-08-01 MED ORDER — NYSTATIN 100000 UNIT/ML MT SUSP
5.0000 mL | Freq: Four times a day (QID) | OROMUCOSAL | Status: DC
  Administered 2017-08-01: 500000 [IU] via ORAL
  Filled 2017-08-01: qty 5

## 2017-08-01 MED ORDER — ONDANSETRON HCL 8 MG PO TABS: ORAL_TABLET | ORAL | 0 refills | Status: DC

## 2017-08-01 MED ORDER — OSELTAMIVIR PHOSPHATE 75 MG PO CAPS: 75.0000 mg | ORAL_CAPSULE | Freq: Two times a day (BID) | ORAL | 0 refills | Status: AC

## 2017-08-01 NOTE — Plan of Care (Signed)
  Progressing Education: Knowledge of General Education information will improve 08/01/2017 0850 - Progressing by Rowe Robert, RN Health Behavior/Discharge Planning: Ability to manage health-related needs will improve 08/01/2017 0850 - Progressing by Rowe Robert, RN Clinical Measurements: Ability to maintain clinical measurements within normal limits will improve 08/01/2017 0850 - Progressing by Rowe Robert, RN Will remain free from infection 08/01/2017 0850 - Progressing by Rowe Robert, RN Diagnostic test results will improve 08/01/2017 0850 - Progressing by Rowe Robert, RN Respiratory complications will improve 08/01/2017 0850 - Progressing by Rowe Robert, RN Cardiovascular complication will be avoided 08/01/2017 0850 - Progressing by Rowe Robert, RN Activity: Risk for activity intolerance will decrease 08/01/2017 0850 - Progressing by Rowe Robert, RN Nutrition: Adequate nutrition will be maintained 08/01/2017 0850 - Progressing by Rowe Robert, RN Coping: Level of anxiety will decrease 08/01/2017 0850 - Progressing by Rowe Robert, RN Elimination: Will not experience complications related to bowel motility 08/01/2017 0850 - Progressing by Rowe Robert, RN Will not experience complications related to urinary retention 08/01/2017 0850 - Progressing by Rowe Robert, RN Pain Managment: General experience of comfort will improve 08/01/2017 0850 - Progressing by Rowe Robert, RN Safety: Ability to remain free from injury will improve 08/01/2017 0850 - Progressing by Rowe Robert, RN Skin Integrity: Risk for impaired skin integrity will decrease 08/01/2017 0850 - Progressing by Rowe Robert, RN Fluid Volume: Hemodynamic stability will improve 08/01/2017 0850 - Progressing by Rowe Robert, RN Clinical Measurements: Diagnostic test results will improve 08/01/2017 0850 - Progressing by Rowe Robert, RN Signs and symptoms of infection will  decrease 08/01/2017 0850 - Progressing by Rowe Robert, RN Respiratory: Ability to maintain adequate ventilation will improve 08/01/2017 0850 - Progressing by Rowe Robert, RN

## 2017-08-01 NOTE — Progress Notes (Signed)
Pt for discharge home / a/o. No resp distress. Sl d/cd x2.  Instructions discussed with pt. meds discussed.  Pt to pick meds up at [facility] management.  Diet / activity and f/u discussed. Verbalizes understanding. Waiting for  Friend to  Pick him up

## 2017-08-01 NOTE — Care Management (Addendum)
Patient to discharge today.  Patient is self pay.  Patient states that he has applied for Medicaid and it is pending.  PCP [place].  Patient received medication assistance through the Cancer.  Normally gets his meds at [facility].  Patient to discharge on tamiflu, zofran, and nystatin. RNCM confirmed that [facility]  Has these scripts in stock.  Patient to pick up medication at [facility]  At no cost. Patient provided with application to [facility]  And [place].   RNCM signing off.

## 2017-08-01 NOTE — Plan of Care (Signed)
  Progressing Education: Knowledge of General Education information will improve 08/01/2017 0317 - Progressing by Denice Bors, RN Health Behavior/Discharge Planning: Ability to manage health-related needs will improve 08/01/2017 0317 - Progressing by Denice Bors, RN Clinical Measurements: Ability to maintain clinical measurements within normal limits will improve 08/01/2017 0317 - Progressing by Denice Bors, RN Will remain free from infection 08/01/2017 0317 - Progressing by Denice Bors, RN Diagnostic test results will improve 08/01/2017 0317 - Progressing by Denice Bors, RN Respiratory complications will improve 08/01/2017 0317 - Progressing by Denice Bors, RN Cardiovascular complication will be avoided 08/01/2017 0317 - Progressing by Denice Bors, RN Activity: Risk for activity intolerance will decrease 08/01/2017 0317 - Progressing by Denice Bors, RN Nutrition: Adequate nutrition will be maintained 08/01/2017 0317 - Progressing by Denice Bors, RN Coping: Level of anxiety will decrease 08/01/2017 0317 - Progressing by Denice Bors, RN Elimination: Will not experience complications related to bowel motility 08/01/2017 0317 - Progressing by Denice Bors, RN Will not experience complications related to urinary retention 08/01/2017 0317 - Progressing by Denice Bors, RN Pain Managment: General experience of comfort will improve 08/01/2017 0317 - Progressing by Denice Bors, RN Safety: Ability to remain free from injury will improve 08/01/2017 0317 - Progressing by Denice Bors, RN Skin Integrity: Risk for impaired skin integrity will decrease 08/01/2017 0317 - Progressing by Denice Bors, RN

## 2017-08-01 NOTE — Discharge Summary (Signed)
[facility] [place] NAME: Nathaniel Saunders    MR#:  [id number]  DATE OF BIRTH:  Apr 07, 1971  DATE OF ADMISSION:  07/31/2017 ADMITTING PHYSICIAN: Dustin Flock, MD  DATE OF DISCHARGE: 08/01/2017  PRIMARY CARE PHYSICIAN: Maeola Sarah, MD    ADMISSION DIAGNOSIS:  Bronchitis [J40] Influenza A [J10.1] Sepsis, due to unspecified organism ([place]) [A41.9]  DISCHARGE DIAGNOSIS:  Active Problems:   Flu   SECONDARY DIAGNOSIS:   Past Medical History:  Diagnosis Date  . Asthma   . Genetic testing 05/20/2017   Multi-Cancer panel (83 genes) @ [facility] - No pathogenic mutations detected  . Pancreatic mass   . Pancreatic mass     HOSPITAL COURSE:   47 year old male with past medical history of pancreatic cancer currently undergoing chemotherapy, radiation, who presented to the hospital due to fever, cough, congestion and noted to be positive for the flu.  1. Flu-patient was positive for influenza A. -He was observed overnight, placed on Tamiflu, droplet precautions and given antitussives and supportive care. -His fever has resolved, he feels better. He's being discharged on oral Tamiflu.  2. Leukocytosis-this was secondary to the flu, combined with patient receiving recent Neupogen postchemotherapy. He shows no signs of sepsis. His white cell count to be followed as an outpatient.  3. Pancreatic cancer-patient is followed both by Dr. Rogue Bussing and he will continue follow-up with oncology as an outpatient. - he was given some Zofran for his nausea.  He has poor PO intake due to cancer/chemo.  He was told to take ensure supplements.   4. Oral Thrush - will place on Nystatin for 7 days.   DISCHARGE CONDITIONS:   Stable.   CONSULTS OBTAINED:    DRUG ALLERGIES:  No Known Allergies  DISCHARGE MEDICATIONS:   Allergies as of 08/01/2017   No Known Allergies     Medication List    STOP taking these medications   lidocaine-prilocaine  cream Commonly known as:  EMLA   loperamide 2 MG capsule Commonly known as:  IMODIUM   ondansetron 4 MG disintegrating tablet Commonly known as:  ZOFRAN ODT   oxyCODONE-acetaminophen 5-325 MG tablet Commonly known as:  PERCOCET/ROXICET   Potassium Chloride ER 20 MEQ Tbcr   prochlorperazine 10 MG tablet Commonly known as:  COMPAZINE   promethazine 12.5 MG tablet Commonly known as:  PHENERGAN   promethazine 25 MG suppository Commonly known as:  PHENERGAN     TAKE these medications   albuterol 108 (90 Base) MCG/ACT inhaler Commonly known as:  PROVENTIL HFA;VENTOLIN HFA Inhale 1-2 puffs every 6 (six) hours as needed into the lungs for wheezing or shortness of breath.   nystatin 100000 UNIT/ML suspension Commonly known as:  MYCOSTATIN Take 5 mLs (500,000 Units total) by mouth 4 (four) times daily for 7 days.   ondansetron 8 MG tablet Commonly known as:  ZOFRAN One pill every 8 hours as needed for nausea/vomitting.   oseltamivir 75 MG capsule Commonly known as:  TAMIFLU Take 1 capsule (75 mg total) by mouth 2 (two) times daily for 4 days.         DISCHARGE INSTRUCTIONS:   DIET:  Regular diet  DISCHARGE CONDITION:  Stable  ACTIVITY:  Activity as tolerated  OXYGEN:  Home Oxygen: No.   Oxygen Delivery: room air  DISCHARGE LOCATION:  home   If you experience worsening of your admission symptoms, develop shortness of breath, life threatening emergency, suicidal or homicidal thoughts you must seek medical attention immediately  by calling 911 or calling your MD immediately  if symptoms less severe.  You Must read complete instructions/literature along with all the possible adverse reactions/side effects for all the Medicines you take and that have been prescribed to you. Take any new Medicines after you have completely understood and accpet all the possible adverse reactions/side effects.   Please note  You were cared for by a hospitalist during your hospital  stay. If you have any questions about your discharge medications or the care you received while you were in the hospital after you are discharged, you can call the unit and asked to speak with the hospitalist on call if the hospitalist that took care of you is not available. Once you are discharged, your primary care physician will handle any further medical issues. Please note that NO REFILLS for any discharge medications will be authorized once you are discharged, as it is imperative that you return to your primary care physician (or establish a relationship with a primary care physician if you do not have one) for your aftercare needs so that they can reassess your need for medications and monitor your lab values.     Today   Cough, congestion has improved, no fever overnight. Patient still complaining that he is nauseated and has lost his appetite since being started on chemotherapy.  VITAL SIGNS:  Blood pressure 107/68, pulse 74, temperature 98.9 F (37.2 C), temperature source Oral, resp. rate 20, height 5\' 3"  (1.6 m), SpO2 100 %.  I/O:    Intake/Output Summary (Last 24 hours) at 08/01/2017 1309 Last data filed at 08/01/2017 1025 Gross per 24 hour  Intake 3049.92 ml  Output -  Net 3049.92 ml    PHYSICAL EXAMINATION:  GENERAL:  47 y.o.-year-old thin patient lying in the bed in no acute distress.  EYES: Pupils equal, round, reactive to light and accommodation. No scleral icterus. Extraocular muscles intact.  HEENT: Head atraumatic, normocephalic. Oropharynx and nasopharynx clear.  NECK:  Supple, no jugular venous distention. No thyroid enlargement, no tenderness.  LUNGS: Normal breath sounds bilaterally, no wheezing, rales,rhonchi. No use of accessory muscles of respiration.  CARDIOVASCULAR: S1, S2 normal. No murmurs, rubs, or gallops.  ABDOMEN: Soft, non-tender, non-distended. Bowel sounds present. No organomegaly or mass.  EXTREMITIES: No pedal edema, cyanosis, or clubbing.   NEUROLOGIC: Cranial nerves II through XII are intact. No focal motor or sensory defecits b/l.  PSYCHIATRIC: The patient is alert and oriented x 3.  SKIN: No obvious rash, lesion, or ulcer.   DATA REVIEW:   CBC Recent Labs  Lab 08/01/17 0355  WBC 34.2*  HGB 13.0  HCT 37.8*  PLT 214    Chemistries  Recent Labs  Lab 07/31/17 1534 08/01/17 0355  NA 134* 136  K 3.8 4.2  CL 102 105  CO2 23 20*  GLUCOSE 103* 126*  BUN 10 12  CREATININE 0.79 0.80  CALCIUM 9.0 8.7*  AST 31  --   ALT 31  --   ALKPHOS 138*  --   BILITOT 1.0  --     Cardiac Enzymes Recent Labs  Lab 07/31/17 1946  TROPONINI 0.05*    Microbiology Results  Results for orders placed or performed during the hospital encounter of 07/31/17  Blood Culture (routine x 2)     Status: None (Preliminary result)   Collection Time: 07/31/17  3:46 PM  Result Value Ref Range Status   Specimen Description BLOOD RT FOREARM  Final   Special Requests   Final  BOTTLES DRAWN AEROBIC AND ANAEROBIC Blood Culture adequate volume   Culture   Final    NO GROWTH < 24 HOURS Performed at [facility], [address] [place], [place] [address]    Report Status PENDING  Incomplete  Blood Culture (routine x 2)     Status: None (Preliminary result)   Collection Time: 07/31/17  3:46 PM  Result Value Ref Range Status   Specimen Description BLOOD LT [facility]  Final   Special Requests   Final    BOTTLES DRAWN AEROBIC AND ANAEROBIC Blood Culture results may not be optimal due to an excessive volume of blood received in culture bottles   Culture   Final    NO GROWTH < 24 HOURS Performed at [facility], [address] [place], [place] [address]    Report Status PENDING  Incomplete    RADIOLOGY:  Dg Chest 2 View  Result Date: 07/31/2017 CLINICAL DATA:  Chemotherapy 2 days ago for pancreatic carcinoma. Fever, cough and shortness of breath since. EXAM: CHEST - 2 VIEW COMPARISON:  Chest CT, 04/15/2017 FINDINGS:  Cardiac silhouette is normal in size. No mediastinal or hilar masses. No convincing adenopathy. Chronic areas of lung scarring. Lungs are hyperexpanded. No evidence of pneumonia or pulmonary edema. Right anterior chest wall Port-A-Cath has its tip in the lower superior vena cava. No pleural effusion or pneumothorax. Skeletal structures are intact. IMPRESSION: 1. No acute cardiopulmonary disease. 2. Areas of lung scarring stable when compared to the prior chest CT. Electronically Signed   By: Lajean Manes M.D.   On: 07/31/2017 16:17      Management plans discussed with the patient, family and they are in agreement.  CODE STATUS:     Code Status Orders  (From admission, onward)        Start     Ordered   07/31/17 1723  Full code  Continuous     07/31/17 1722  Advance Directive Documentation     Most Recent Value  Type of Advance Directive  Healthcare Power of Attorney, Living will  Pre-existing out of facility DNR order (yellow form or pink MOST form)  No data  "MOST" Form in Place?  No data      TOTAL TIME TAKING CARE OF THIS PATIENT: 40 minutes.    Henreitta Leber M.D on 08/01/2017 at 1:09 PM  Between 7am to 6pm - Pager - [phone]  After 6pm go to [email or web address] - Proofreader  [facility]  Office  [phone]  CC: Primary care physician; Maeola Sarah, MD

## 2017-08-01 NOTE — Plan of Care (Signed)
  Progressing Education: Knowledge of General Education information will improve 08/01/2017 0322 - Progressing by Denice Bors, RN 08/01/2017 [phone] - Progressing by Denice Bors, RN Health Behavior/Discharge Planning: Ability to manage health-related needs will improve 08/01/2017 0322 - Progressing by Denice Bors, RN 08/01/2017 [phone] - Progressing by Denice Bors, RN Clinical Measurements: Ability to maintain clinical measurements within normal limits will improve 08/01/2017 0322 - Progressing by Denice Bors, RN 08/01/2017 [phone] - Progressing by Denice Bors, RN Will remain free from infection 08/01/2017 0322 - Progressing by Denice Bors, RN 08/01/2017 [phone] - Progressing by Denice Bors, RN Diagnostic test results will improve 08/01/2017 0322 - Progressing by Denice Bors, RN 08/01/2017 [phone] - Progressing by Denice Bors, RN Respiratory complications will improve 08/01/2017 0322 - Progressing by Denice Bors, RN 08/01/2017 0317 - Progressing by Denice Bors, RN Cardiovascular complication will be avoided 08/01/2017 0322 - Progressing by Denice Bors, RN 08/01/2017 0317 - Progressing by Denice Bors, RN Activity: Risk for activity intolerance will decrease 08/01/2017 0322 - Progressing by Denice Bors, RN 08/01/2017 0317 - Progressing by Denice Bors, RN Nutrition: Adequate nutrition will be maintained 08/01/2017 0322 - Progressing by Denice Bors, RN 08/01/2017 0317 - Progressing by Denice Bors, RN Coping: Level of anxiety will decrease 08/01/2017 0322 - Progressing by Denice Bors, RN 08/01/2017 0317 - Progressing by Denice Bors, RN Elimination: Will not experience complications related to bowel motility 08/01/2017 0322 - Progressing by Denice Bors, RN 08/01/2017 0317 - Progressing by Denice Bors, RN Will not experience complications related to urinary retention 08/01/2017 0322 - Progressing by Denice Bors, RN 08/01/2017 0317 - Progressing by Denice Bors, RN Pain Managment: General experience of comfort will improve 08/01/2017 0322 - Progressing by Denice Bors, RN 08/01/2017 0317 - Progressing by Denice Bors, RN Safety: Ability to remain free from injury will improve 08/01/2017 0322 - Progressing by Denice Bors, RN 08/01/2017 0317 - Progressing by Denice Bors, RN Skin Integrity: Risk for impaired skin integrity will decrease 08/01/2017 0322 - Progressing by Denice Bors, RN 08/01/2017 0317 - Progressing by Denice Bors, RN Fluid Volume: Hemodynamic stability will improve 08/01/2017 0322 - Progressing by Denice Bors, RN Clinical Measurements: Diagnostic test results will improve 08/01/2017 0322 - Progressing by Denice Bors, RN Signs and symptoms of infection will decrease 08/01/2017 0322 - Progressing by Denice Bors, RN Respiratory: Ability to maintain adequate ventilation will improve 08/01/2017 0322 - Progressing by Denice Bors, RN

## 2017-08-05 ENCOUNTER — Telehealth: Payer: Self-pay | Admitting: Internal Medicine

## 2017-08-05 LAB — CULTURE, BLOOD (ROUTINE X 2)
Culture: NO GROWTH
Culture: NO GROWTH
Special Requests: ADEQUATE

## 2017-08-05 NOTE — Telephone Encounter (Signed)
Spoke to [facility];  plan to proceed with whipples soon; will cancel chemo for next week; pt to keep as planned/labs.

## 2017-08-06 ENCOUNTER — Inpatient Hospital Stay
Admission: EM | Admit: 2017-08-06 | Discharge: 2017-08-08 | DRG: 194 | Disposition: A | Discharge status: Home / Self Care | Payer: Medicaid Other | Attending: Internal Medicine | Admitting: Internal Medicine

## 2017-08-06 ENCOUNTER — Emergency Department: Payer: Medicaid Other

## 2017-08-06 ENCOUNTER — Encounter: Payer: Self-pay | Admitting: Emergency Medicine

## 2017-08-06 ENCOUNTER — Other Ambulatory Visit: Payer: Self-pay

## 2017-08-06 DIAGNOSIS — E876 Hypokalemia: Secondary | ICD-10-CM | POA: Diagnosis present

## 2017-08-06 DIAGNOSIS — J181 Lobar pneumonia, unspecified organism: Secondary | ICD-10-CM | POA: Diagnosis present

## 2017-08-06 DIAGNOSIS — J189 Pneumonia, unspecified organism: Secondary | ICD-10-CM | POA: Diagnosis present

## 2017-08-06 DIAGNOSIS — C259 Malignant neoplasm of pancreas, unspecified: Secondary | ICD-10-CM | POA: Diagnosis present

## 2017-08-06 DIAGNOSIS — Y95 Nosocomial condition: Secondary | ICD-10-CM | POA: Diagnosis present

## 2017-08-06 DIAGNOSIS — J45901 Unspecified asthma with (acute) exacerbation: Secondary | ICD-10-CM | POA: Diagnosis present

## 2017-08-06 DIAGNOSIS — Z87891 Personal history of nicotine dependence: Secondary | ICD-10-CM

## 2017-08-06 DIAGNOSIS — T458X5A Adverse effect of other primarily systemic and hematological agents, initial encounter: Secondary | ICD-10-CM | POA: Diagnosis present

## 2017-08-06 DIAGNOSIS — D72829 Elevated white blood cell count, unspecified: Secondary | ICD-10-CM | POA: Diagnosis present

## 2017-08-06 LAB — COMPREHENSIVE METABOLIC PANEL
ALBUMIN: 3.6 g/dL (ref 3.5–5.0)
ALT: 27 U/L (ref 17–63)
ANION GAP: 10 (ref 5–15)
AST: 29 U/L (ref 15–41)
Alkaline Phosphatase: 117 U/L (ref 38–126)
BILIRUBIN TOTAL: 1 mg/dL (ref 0.3–1.2)
BUN: 5 mg/dL — AB (ref 6–20)
CHLORIDE: 105 mmol/L (ref 101–111)
CO2: 24 mmol/L (ref 22–32)
Calcium: 8.8 mg/dL — ABNORMAL LOW (ref 8.9–10.3)
Creatinine, Ser: 0.69 mg/dL (ref 0.61–1.24)
GFR calc Af Amer: >60 mL/min (ref >60)
Glucose, Bld: 109 mg/dL — ABNORMAL HIGH (ref 65–99)
POTASSIUM: 3 mmol/L — AB (ref 3.5–5.1)
Sodium: 139 mmol/L (ref 135–145)
TOTAL PROTEIN: 7.5 g/dL (ref 6.5–8.1)

## 2017-08-06 LAB — MAGNESIUM: Magnesium: 1.4 mg/dL — ABNORMAL LOW (ref 1.7–2.4)

## 2017-08-06 LAB — CBC WITH DIFFERENTIAL/PLATELET
BASOS ABS: 0 10*3/uL (ref 0–0.1)
Basophils Relative: 0 %
Eosinophils Absolute: 0 10*3/uL (ref 0–0.7)
Eosinophils Relative: 0 %
HCT: 37.6 % — ABNORMAL LOW (ref 40.0–52.0)
Hemoglobin: 12.8 g/dL — ABNORMAL LOW (ref 13.0–18.0)
LYMPHS ABS: 2 10*3/uL (ref 1.0–3.6)
Lymphocytes Relative: 10 %
MCH: 32.1 pg (ref 26.0–34.0)
MCHC: 34.1 g/dL (ref 32.0–36.0)
MCV: 94 fL (ref 80.0–100.0)
Monocytes Absolute: 2.4 10*3/uL — ABNORMAL HIGH (ref 0.2–1.0)
Monocytes Relative: 12 %
Neutro Abs: 15.4 10*3/uL — ABNORMAL HIGH (ref 1.4–6.5)
Neutrophils Relative %: 78 %
Platelets: 203 10*3/uL (ref 150–440)
RBC: 3.99 MIL/uL — AB (ref 4.40–5.90)
RDW: 14 % (ref 11.5–14.5)
WBC: 19.8 10*3/uL — AB (ref 3.8–10.6)

## 2017-08-06 LAB — INFLUENZA PANEL BY PCR (TYPE A & B)
INFLBPCR: NEGATIVE
Influenza A By PCR: NEGATIVE

## 2017-08-06 LAB — LACTIC ACID, PLASMA: LACTIC ACID, VENOUS: 1.4 mmol/L (ref 0.5–1.9)

## 2017-08-06 MED ORDER — NYSTATIN 100000 UNIT/ML MT SUSP
5.0000 mL | Freq: Four times a day (QID) | OROMUCOSAL | Status: DC
  Administered 2017-08-06 – 2017-08-07 (×4): 500000 [IU] via ORAL
  Filled 2017-08-06 (×4): qty 5

## 2017-08-06 MED ORDER — DEXTROSE 5 % IV SOLN
250.0000 mg | INTRAVENOUS | Status: DC
  Administered 2017-08-07: 250 mg via INTRAVENOUS
  Filled 2017-08-06 (×2): qty 250

## 2017-08-06 MED ORDER — PIPERACILLIN-TAZOBACTAM 3.375 G IVPB 30 MIN
3.3750 g | Freq: Once | INTRAVENOUS | Status: AC
  Administered 2017-08-06: 3.375 g via INTRAVENOUS
  Filled 2017-08-06: qty 50

## 2017-08-06 MED ORDER — ALBUTEROL SULFATE (2.5 MG/3ML) 0.083% IN NEBU: 2.5000 mg | INHALATION_SOLUTION | Freq: Four times a day (QID) | RESPIRATORY_TRACT | Status: DC | PRN

## 2017-08-06 MED ORDER — DOCUSATE SODIUM 100 MG PO CAPS: 100.0000 mg | ORAL_CAPSULE | Freq: Two times a day (BID) | ORAL | Status: DC | PRN

## 2017-08-06 MED ORDER — ONDANSETRON HCL 4 MG/2ML IJ SOLN
INTRAMUSCULAR | Status: AC
  Administered 2017-08-06: 4 mg via INTRAVENOUS
  Filled 2017-08-06: qty 2

## 2017-08-06 MED ORDER — IBUPROFEN 100 MG/5ML PO SUSP
ORAL | Status: AC
  Administered 2017-08-06: 600 mg via ORAL
  Filled 2017-08-06: qty 30

## 2017-08-06 MED ORDER — ONDANSETRON HCL 4 MG PO TABS: 4.0000 mg | ORAL_TABLET | Freq: Three times a day (TID) | ORAL | Status: DC | PRN

## 2017-08-06 MED ORDER — ONDANSETRON HCL 4 MG/2ML IJ SOLN
4.0000 mg | Freq: Once | INTRAMUSCULAR | Status: AC
  Administered 2017-08-06: 4 mg via INTRAVENOUS

## 2017-08-06 MED ORDER — SODIUM CHLORIDE 0.9 % IV SOLN
1.0000 g | INTRAVENOUS | Status: DC
  Administered 2017-08-06 – 2017-08-07 (×2): 1 g via INTRAVENOUS
  Filled 2017-08-06 (×3): qty 10

## 2017-08-06 MED ORDER — SODIUM CHLORIDE 0.9 % IV BOLUS (SEPSIS)
1000.0000 mL | Freq: Once | INTRAVENOUS | Status: AC
  Administered 2017-08-06: 1000 mL via INTRAVENOUS

## 2017-08-06 MED ORDER — VANCOMYCIN HCL IN DEXTROSE 1-5 GM/200ML-% IV SOLN
1000.0000 mg | Freq: Once | INTRAVENOUS | Status: AC
  Administered 2017-08-06: 1000 mg via INTRAVENOUS
  Filled 2017-08-06: qty 200

## 2017-08-06 MED ORDER — POTASSIUM CHLORIDE CRYS ER 20 MEQ PO TBCR
20.0000 meq | EXTENDED_RELEASE_TABLET | Freq: Two times a day (BID) | ORAL | Status: DC
  Administered 2017-08-06 – 2017-08-07 (×3): 20 meq via ORAL
  Filled 2017-08-06 (×3): qty 1

## 2017-08-06 MED ORDER — IBUPROFEN 100 MG/5ML PO SUSP
600.0000 mg | Freq: Once | ORAL | Status: AC
  Administered 2017-08-06: 600 mg via ORAL

## 2017-08-06 MED ORDER — LEVOFLOXACIN IN D5W 750 MG/150ML IV SOLN
750.0000 mg | Freq: Once | INTRAVENOUS | Status: AC
  Administered 2017-08-06: 750 mg via INTRAVENOUS
  Filled 2017-08-06: qty 150

## 2017-08-06 MED ORDER — HEPARIN SODIUM (PORCINE) 5000 UNIT/ML IJ SOLN
5000.0000 [IU] | Freq: Three times a day (TID) | INTRAMUSCULAR | Status: DC
  Administered 2017-08-06 – 2017-08-07 (×3): 5000 [IU] via SUBCUTANEOUS
  Filled 2017-08-06 (×4): qty 1

## 2017-08-06 MED ORDER — GUAIFENESIN-CODEINE 100-10 MG/5ML PO SOLN
10.0000 mL | Freq: Four times a day (QID) | ORAL | Status: DC | PRN
  Administered 2017-08-07: 10 mL via ORAL
  Filled 2017-08-06 (×3): qty 10

## 2017-08-06 NOTE — ED Triage Notes (Addendum)
Cough and flu x 10 days. Cont fever and cough. States was admitted x 1 day during that time. States has history of pancreatic ca. No chemo x 1 year. resp unlabored. Patient has not taken anything for fever in 2 days.

## 2017-08-06 NOTE — ED Notes (Signed)
Patient transported to 102 

## 2017-08-06 NOTE — ED Notes (Signed)
Attempted to call to be roomed at 5:23 pm.

## 2017-08-06 NOTE — ED Provider Notes (Signed)
Case d/w NP Triplett.  Pt p/w fever, bilateral pneumonia after recent hospitalization. [place] admission for IV abx, IVF, treatment of HCAP. VS otherwise WNL, lactate nl. Pt not septic.    Carrie Mew, MD 08/06/17 1950

## 2017-08-06 NOTE — H&P (Signed)
[place] [facility] [place] NAME: Nathaniel Saunders    MR#:  [id number]  DATE OF BIRTH:  [birth year]  DATE OF ADMISSION:  08/06/2017  PRIMARY CARE PHYSICIAN: Maeola Sarah, MD   REQUESTING/REFERRING PHYSICIAN: [place]  CHIEF COMPLAINT:   Chief Complaint  Patient presents with  . Cough  . Influenza    HISTORY OF PRESENT ILLNESS: Nathaniel Saunders  is a 46 y.o. male with a known history of pancreatic cancer- was admitted for Influenza here and sent home 5 days ago. Today felt sweaty, cough and had fever with cough. Came back to ER, noted to have pneumonia and sepsis. So given to hospitalist for admission.  PAST MEDICAL HISTORY:   Past Medical History:  Diagnosis Date  . Asthma   . Genetic testing 05/20/2017   Multi-Cancer panel (83 genes) @ [facility] - No pathogenic mutations detected  . Pancreatic mass   . Pancreatic mass     PAST SURGICAL HISTORY:  Past Surgical History:  Procedure Laterality Date  . ERCP N/A 04/21/2017   Procedure: ENDOSCOPIC RETROGRADE CHOLANGIOPANCREATOGRAPHY (ERCP);  Surgeon: Lucilla Lame, MD;  Location: [facility] ENDOSCOPY;  Service: Endoscopy;  Laterality: N/A;  . ERCP N/A 05/06/2017   Procedure: ENDOSCOPIC RETROGRADE CHOLANGIOPANCREATOGRAPHY (ERCP);  Surgeon: Lucilla Lame, MD;  Location[facility] ENDOSCOPY;  Service: Endoscopy;  Laterality: N/A;  . PORTA CATH INSERTION N/A 04/18/2017   Procedure: PORTA CATH INSERTION;  Surgeon: Algernon Huxley, MD;  Location: [facility];  Service: Cardiovascular;  Laterality: N/A;    SOCIAL HISTORY:  Social History   Tobacco Use  . Smoking status: Former Smoker    Packs/day: 0.25    Years: 5.00    Pack years: 1.25    Types: Cigarettes    Last attempt to quit: 05/07/2015    Years since quitting: 2.2  . Smokeless tobacco: Never Used  Substance Use Topics  . Alcohol use: No    FAMILY HISTORY:  Family History  Problem Relation Age of Onset  . Pancreatic cancer Mother 17        deceased 108  . Diabetes Father   . Colon cancer Maternal Grandmother 70       deceased 71    DRUG ALLERGIES: No Known Allergies  REVIEW OF SYSTEMS:   CONSTITUTIONAL: have fever,no fatigue or weakness.  EYES: No blurred or double vision.  EARS, NOSE, AND THROAT: No tinnitus or ear pain.  RESPIRATORY: have cough, shortness of breath, no wheezing or hemoptysis.  CARDIOVASCULAR: No chest pain, orthopnea, edema.  GASTROINTESTINAL: No nausea, vomiting, diarrhea or abdominal pain.  GENITOURINARY: No dysuria, hematuria.  ENDOCRINE: No polyuria, nocturia,  HEMATOLOGY: No anemia, easy bruising or bleeding SKIN: No rash or lesion. MUSCULOSKELETAL: No joint pain or arthritis.   NEUROLOGIC: No tingling, numbness, weakness.  PSYCHIATRY: No anxiety or depression.   MEDICATIONS AT HOME:  Prior to Admission medications   Medication Sig Start Date End Date Taking? Authorizing Provider  albuterol (PROVENTIL HFA;VENTOLIN HFA) 108 (90 Base) MCG/ACT inhaler Inhale 1-2 puffs every 6 (six) hours as needed into the lungs for wheezing or shortness of breath.    [provider]  nystatin (MYCOSTATIN) 100000 UNIT/ML suspension Take 5 mLs (500,000 Units total) by mouth 4 (four) times daily for 7 days. 08/01/17 08/08/17  Henreitta Leber, MD  ondansetron (ZOFRAN) 8 MG tablet One pill every 8 hours as needed for nausea/vomitting. 08/01/17   Henreitta Leber, MD      PHYSICAL EXAMINATION:  VITAL SIGNS: Blood pressure 121/78, pulse 92, temperature (!) 102.2 F (39 C), temperature source Oral, height 5\' 3"  (1.6 m), weight 68 kg (150 lb), SpO2 99 %.  GENERAL:  47 y.o.-year-old patient lying in the bed with no acute distress.  EYES: Pupils equal, round, reactive to light and accommodation. No scleral icterus. Extraocular muscles intact.  HEENT: Head atraumatic, normocephalic. Oropharynx and nasopharynx clear.  NECK:  Supple, no jugular venous distention. No thyroid enlargement, no tenderness.  LUNGS:  Normal breath sounds bilaterally, no wheezing, have crepitation. No use of accessory muscles of respiration.  CARDIOVASCULAR: S1, S2 normal. No murmurs, rubs, or gallops.  ABDOMEN: Soft, nontender, nondistended. Bowel sounds present. No organomegaly or mass.  EXTREMITIES: No pedal edema, cyanosis, or clubbing.  NEUROLOGIC: Cranial nerves II through XII are intact. Muscle strength 5/5 in all extremities. Sensation intact. Gait not checked.  PSYCHIATRIC: The patient is alert and oriented x 3.  SKIN: No obvious rash, lesion, or ulcer.   LABORATORY PANEL:   CBC Recent Labs  Lab 07/31/17 1534 08/01/17 0355 08/06/17 1832  WBC 37.4* 34.2* 19.8*  HGB 14.4 13.0 12.8*  HCT 43.1 37.8* 37.6*  PLT 237 214 203  MCV 95.5 95.8 94.0  MCH 31.9 33.0 32.1  MCHC 33.4 34.5 34.1  RDW 14.2 14.4 14.0  LYMPHSABS 1.1  --  2.0  MONOABS 0.8  --  2.4*  EOSABS 0.0  --  0.0  BASOSABS 0.0  --  0.0   ------------------------------------------------------------------------------------------------------------------  Chemistries  Recent Labs  Lab 07/31/17 1534 08/01/17 0355 08/06/17 1832  NA 134* 136 139  K 3.8 4.2 3.0*  CL 102 105 105  CO2 23 20* 24  GLUCOSE 103* 126* 109*  BUN 10 12 5*  CREATININE 0.79 0.80 0.69  CALCIUM 9.0 8.7* 8.8*  AST 31  --  29  ALT 31  --  27  ALKPHOS 138*  --  117  BILITOT 1.0  --  1.0   ------------------------------------------------------------------------------------------------------------------ estimated creatinine clearance is 93.8 mL/min (by C-G formula based on SCr of 0.69 mg/dL). ------------------------------------------------------------------------------------------------------------------ No results for input(s): TSH, T4TOTAL, T3FREE, THYROIDAB in the last 72 hours.  Invalid input(s): FREET3   Coagulation profile No results for input(s): INR, PROTIME in the last 168  hours. ------------------------------------------------------------------------------------------------------------------- No results for input(s): DDIMER in the last 72 hours. -------------------------------------------------------------------------------------------------------------------  Cardiac Enzymes Recent Labs  Lab 07/31/17 1534 07/31/17 1946  TROPONINI 0.03* 0.05*   ------------------------------------------------------------------------------------------------------------------ Invalid input(s): POCBNP  ---------------------------------------------------------------------------------------------------------------  Urinalysis    Component Value Date/Time   COLORURINE YELLOW (A) 07/31/2017 1546   APPEARANCEUR CLEAR (A) 07/31/2017 1546   LABSPEC 1.028 07/31/2017 1546   PHURINE 6.0 07/31/2017 1546   GLUCOSEU NEGATIVE 07/31/2017 1546   HGBUR SMALL (A) 07/31/2017 1546   BILIRUBINUR NEGATIVE 07/31/2017 1546   KETONESUR NEGATIVE 07/31/2017 1546   PROTEINUR 30 (A) 07/31/2017 1546   NITRITE NEGATIVE 07/31/2017 1546   LEUKOCYTESUR NEGATIVE 07/31/2017 1546     RADIOLOGY: Dg Chest 2 View  Result Date: 08/06/2017 CLINICAL DATA:  47 year old with current history of pancreatic cancer, presenting with a 10 day history of cough. Former smoker. EXAM: CHEST - 2 VIEW COMPARISON:  Chest x-ray 07/31/2017.  CT chest 04/15/2017. FINDINGS: Cardiomediastinal silhouette unremarkable, unchanged. Bullous emphysematous changes in both lungs, left greater than right. Scattered areas of parenchymal scarring and fibrosis as noted previously. Increased patchy airspace opacity involving the left lower lobe, including the superior segment, superimposed upon the parenchymal scarring. No visible pleural effusions. Right jugular Port-A-Cath tip in  the lower SVC near the cavoatrial junction, unchanged. Degenerative changes involving the lower thoracic spine. Common bile duct stent noted in the right upper  quadrant of the abdomen. 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 Electronically Signed   By: Evangeline Dakin M.D.   On: 08/06/2017 16:37    EKG: Orders placed or performed during the hospital encounter of 07/31/17  . ED EKG  . ED EKG  . EKG    IMPRESSION AND PLAN:  * Sepsis with pneumonia   ER gave broad spectrum Abx.   Will give Rocephin + Azithromycin.   Monitor,  * hypokalemia   Check Mg   Replace Oral.  * pancreatic cancer   [facility]. Monitor.  All the records are reviewed and case discussed with ED provider. Management plans discussed with the patient, family and they are in agreement.  CODE STATUS: Full. Code Status History    Date Active Date Inactive Code Status Order ID Comments User Context   07/31/2017 17:22 08/01/2017 17:56 Full Code [id number]  Dustin Flock, MD ED   04/29/2017 17:29 04/30/2017 19:59 Full Code [id number]  Vaughan Basta, MD Inpatient   04/21/2017 13:22 04/22/2017 15:47 Full Code [id number]  Epifanio Lesches, MD ED    Advance Directive Documentation     Most Recent Value  Type of Advance Directive  Healthcare Power of Attorney  Pre-existing out of facility DNR order (yellow form or pink MOST form)  No data  "MOST" Form in Place?  No data       TOTAL TIME TAKING CARE OF THIS PATIENT: 50 minutes.    Vaughan Basta M.D on 08/06/2017   Between 7am to 6pm - Pager - [phone]  After 6pm go to [email or web address] - password [email or web address] [place] [facility]  Office  [phone]  CC: Primary care physician; Maeola Sarah, MD   Note: This dictation was prepared with Dragon dictation along with smaller phrase technology. Any transcriptional errors that result from this process are unintentional.

## 2017-08-07 LAB — BASIC METABOLIC PANEL
Anion gap: 9 (ref 5–15)
BUN: 7 mg/dL (ref 6–20)
CHLORIDE: 107 mmol/L (ref 101–111)
CO2: 23 mmol/L (ref 22–32)
CREATININE: 0.63 mg/dL (ref 0.61–1.24)
Calcium: 8.3 mg/dL — ABNORMAL LOW (ref 8.9–10.3)
GFR calc Af Amer: >60 mL/min (ref >60)
GFR calc non Af Amer: >60 mL/min (ref >60)
GLUCOSE: 103 mg/dL — AB (ref 65–99)
POTASSIUM: 3.6 mmol/L (ref 3.5–5.1)
Sodium: 139 mmol/L (ref 135–145)

## 2017-08-07 LAB — CBC
HEMATOCRIT: 33.8 % — AB (ref 40.0–52.0)
Hemoglobin: 12 g/dL — ABNORMAL LOW (ref 13.0–18.0)
MCH: 33.2 pg (ref 26.0–34.0)
MCHC: 35.5 g/dL (ref 32.0–36.0)
MCV: 93.4 fL (ref 80.0–100.0)
PLATELETS: 200 10*3/uL (ref 150–440)
RBC: 3.61 MIL/uL — ABNORMAL LOW (ref 4.40–5.90)
RDW: 13.9 % (ref 11.5–14.5)
WBC: 17 10*3/uL — AB (ref 3.8–10.6)

## 2017-08-07 MED ORDER — LEVOFLOXACIN IN D5W 750 MG/150ML IV SOLN: 750.0000 mg | INTRAVENOUS | Status: DC

## 2017-08-07 MED ORDER — LEVOFLOXACIN 750 MG PO TABS: 750.0000 mg | ORAL_TABLET | Freq: Every day | ORAL | 0 refills | Status: DC

## 2017-08-07 MED ORDER — PREDNISONE 50 MG PO TABS
50.0000 mg | ORAL_TABLET | Freq: Every day | ORAL | Status: DC
  Administered 2017-08-07 – 2017-08-08 (×2): 50 mg via ORAL
  Filled 2017-08-07 (×2): qty 1

## 2017-08-07 MED ORDER — SODIUM CHLORIDE 0.9% FLUSH: 10.0000 mL | INTRAVENOUS | Status: DC | PRN

## 2017-08-07 MED ORDER — ACETAMINOPHEN 325 MG PO TABS
650.0000 mg | ORAL_TABLET | Freq: Four times a day (QID) | ORAL | Status: DC | PRN
  Administered 2017-08-07: 14:00:00 650 mg via ORAL
  Filled 2017-08-07: qty 2

## 2017-08-07 MED ORDER — PREDNISONE 50 MG PO TABS: 50.0000 mg | ORAL_TABLET | Freq: Every day | ORAL | 0 refills | Status: DC

## 2017-08-07 NOTE — Progress Notes (Signed)
[facility] at [place] NAME: Nathaniel Saunders    MR#:  [id number]  DATE OF BIRTH:  09/21/1970  SUBJECTIVE:  CHIEF COMPLAINT:   Chief Complaint  Patient presents with  . Cough  . Influenza   Continues to have fever today.  101.  Shortness of breath is improved.  Cough.  Dry.  REVIEW OF SYSTEMS:    Review of Systems  Constitutional: Negative for chills and fever.  HENT: Negative for sore throat.   Eyes: Negative for blurred vision, double vision and pain.  Respiratory: Positive for cough and shortness of breath. Negative for hemoptysis and wheezing.   Cardiovascular: Negative for chest pain, palpitations, orthopnea and leg swelling.  Gastrointestinal: Negative for abdominal pain, constipation, diarrhea, heartburn, nausea and vomiting.  Genitourinary: Negative for dysuria and hematuria.  Musculoskeletal: Negative for back pain and joint pain.  Skin: Negative for rash.  Neurological: Negative for sensory change, speech change, focal weakness and headaches.  Endo/Heme/Allergies: Does not bruise/bleed easily.  Psychiatric/Behavioral: Negative for depression. The patient is not nervous/anxious.     DRUG ALLERGIES:  No Known Allergies  VITALS:  Blood pressure 107/68, pulse 92, temperature (!) 101.1 F (38.4 C), temperature source Oral, resp. rate 18, height 5\' 3"  (1.6 m), weight 63.5 kg (139 lb 14.4 oz), SpO2 100 %.  PHYSICAL EXAMINATION:   Physical Exam  GENERAL:  47 y.o.-year-old patient lying in the bed with no acute distress.  EYES: Pupils equal, round, reactive to light and accommodation. No scleral icterus. Extraocular muscles intact.  HEENT: Head atraumatic, normocephalic. Oropharynx and nasopharynx clear.  NECK:  Supple, no jugular venous distention. No thyroid enlargement, no tenderness.  LUNGS: Bilateral wheezing.  Left greater than right. CARDIOVASCULAR: S1, S2 normal. No murmurs, rubs, or gallops.  ABDOMEN: Soft, nontender,  nondistended. Bowel sounds present. No organomegaly or mass.  EXTREMITIES: No cyanosis, clubbing or edema b/l.    NEUROLOGIC: Cranial nerves II through XII are intact. No focal Motor or sensory deficits b/l.   PSYCHIATRIC: The patient is alert and oriented x 3.  SKIN: No obvious rash, lesion, or ulcer.   LABORATORY PANEL:   CBC Recent Labs  Lab 08/07/17 0510  WBC 17.0*  HGB 12.0*  HCT 33.8*  PLT 200   ------------------------------------------------------------------------------------------------------------------ Chemistries  Recent Labs  Lab 08/06/17 1832 08/07/17 0510  NA 139 139  K 3.0* 3.6  CL 105 107  CO2 24 23  GLUCOSE 109* 103*  BUN 5* 7  CREATININE 0.69 0.63  CALCIUM 8.8* 8.3*  MG 1.4*  --   AST 29  --   ALT 27  --   ALKPHOS 117  --   BILITOT 1.0  --    ------------------------------------------------------------------------------------------------------------------  Cardiac Enzymes Recent Labs  Lab 07/31/17 1946  TROPONINI 0.05*   ------------------------------------------------------------------------------------------------------------------  RADIOLOGY:  Dg Chest 2 View  Result Date: 08/06/2017 CLINICAL DATA:  47 year old with current history of pancreatic cancer, presenting with a 10 day history of cough. Former smoker. EXAM: CHEST - 2 VIEW COMPARISON:  Chest x-ray 07/31/2017.  CT chest 04/15/2017. FINDINGS: Cardiomediastinal silhouette unremarkable, unchanged. Bullous emphysematous changes in both lungs, left greater than right. Scattered areas of parenchymal scarring and fibrosis as noted previously. Increased patchy airspace opacity involving the left lower lobe, including the superior segment, superimposed upon the parenchymal scarring. No visible pleural effusions. Right jugular Port-A-Cath tip in the lower SVC near the cavoatrial junction, unchanged. Degenerative changes involving the lower thoracic spine. Common bile duct stent noted  in the right  upper quadrant of the abdomen. 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 Electronically Signed   By: Evangeline Dakin M.D.   On: 08/06/2017 16:37     ASSESSMENT AND PLAN:   *Left lower lobe pneumonia On IV ceftriaxone and azithromycin. Continues to have fever with 101 today. Wait for cultures.  Tylenol as needed.  *Acute asthma exacerbation.  Will start prednisone. Nebulizers.  *Pancreatic cancer.  Follow-up at [facility].  *Hypokalemia.  Replaced orally.  *Leukocytosis due to recent Neupogen.  *DVT prophylaxis with Lovenox  All the records are reviewed and case discussed with Care Management/Social Workerr. Management plans discussed with the patient, family and they are in agreement.  CODE STATUS: FULL CODE  DVT Prophylaxis: SCDs  TOTAL TIME TAKING CARE OF THIS PATIENT: 35 minutes.   POSSIBLE D/C IN 1-2 DAYS, DEPENDING ON CLINICAL CONDITION.  Neita Carp M.D on 08/07/2017 at 1:11 PM  Between 7am to 6pm - Pager - [phone]  After 6pm go to [email or web address] - password [email or web address] [facility]  Office  [phone]  CC: Primary care physician; Maeola Sarah, MD  Note: This dictation was prepared with Dragon dictation along with smaller phrase technology. Any transcriptional errors that result from this process are unintentional.

## 2017-08-07 NOTE — Plan of Care (Signed)
Patient seen resting comfortably on his bed. Alert and oriented x 4, not in any form of distress. Verbalized no complaints. Right chest POrt-a-cath accessed with G20x 1inch huber needle, tolerated well, with good blood return. Patient noted for coughing, obtained an order for cough syrup with codei, administered, tolerated well. Kept safe and comfortable. Needs attended.

## 2017-08-08 LAB — CBC WITH DIFFERENTIAL/PLATELET
BASOS ABS: 0 10*3/uL (ref 0–0.1)
Basophils Relative: 0 %
Eosinophils Absolute: 0 10*3/uL (ref 0–0.7)
Eosinophils Relative: 0 %
HCT: 38.1 % — ABNORMAL LOW (ref 40.0–52.0)
HEMOGLOBIN: 12.9 g/dL — AB (ref 13.0–18.0)
LYMPHS ABS: 2.6 10*3/uL (ref 1.0–3.6)
Lymphocytes Relative: 16 %
MCH: 32.1 pg (ref 26.0–34.0)
MCHC: 33.7 g/dL (ref 32.0–36.0)
MCV: 95.2 fL (ref 80.0–100.0)
MONO ABS: 2.1 10*3/uL — AB (ref 0.2–1.0)
MONOS PCT: 13 %
NEUTROS PCT: 71 %
Neutro Abs: 11.5 10*3/uL — ABNORMAL HIGH (ref 1.4–6.5)
Platelets: 264 10*3/uL (ref 150–440)
RBC: 4 MIL/uL — AB (ref 4.40–5.90)
RDW: 13.9 % (ref 11.5–14.5)
WBC: 16.2 10*3/uL — AB (ref 3.8–10.6)

## 2017-08-08 MED ORDER — LEVOFLOXACIN 750 MG PO TABS: 750.0000 mg | ORAL_TABLET | Freq: Every day | ORAL | 0 refills | Status: DC

## 2017-08-08 MED ORDER — ACETAMINOPHEN 325 MG PO TABS: 650.0000 mg | ORAL_TABLET | Freq: Four times a day (QID) | ORAL | 0 refills | Status: DC | PRN

## 2017-08-08 MED ORDER — HEPARIN SOD (PORK) LOCK FLUSH 100 UNIT/ML IV SOLN
500.0000 [IU] | Freq: Once | INTRAVENOUS | Status: AC
  Administered 2017-08-08: 500 [IU] via INTRAVENOUS
  Filled 2017-08-08: qty 5

## 2017-08-08 NOTE — Progress Notes (Signed)
Pt. Discharged in self care. Written discharge instructions reviewed and provided to pt. No questions at time of discharge.

## 2017-08-08 NOTE — Care Management (Signed)
Patient discharged prior to being evaluated by [facility]

## 2017-08-08 NOTE — Discharge Summary (Signed)
[place] [facility] at [place] NAME: Nathaniel Saunders    MR#:  [id number]  DATE OF BIRTH:  07-10-1970  DATE OF ADMISSION:  08/06/2017 ADMITTING PHYSICIAN: Vaughan Basta, MD  DATE OF DISCHARGE: 08/08/2017   PRIMARY CARE PHYSICIAN: Maeola Sarah, MD    ADMISSION DIAGNOSIS:  HCAP (healthcare-associated pneumonia) [J18.9]  DISCHARGE DIAGNOSIS:  Principal Problem:   Community acquired pneumonia Active Problems:   Pneumonia   SECONDARY DIAGNOSIS:   Past Medical History:  Diagnosis Date  . Asthma   . Genetic testing 05/20/2017   Multi-Cancer panel (83 genes) @ [facility] - No pathogenic mutations detected  . Pancreatic mass   . Pancreatic mass     HOSPITAL COURSE:    *Left lower lobe pneumonia On IV ceftriaxone and azithromycin. cx negative, he had fever one more time yesterday, but he overall feels much better.  No more cough or sputum, he has now good apetite. Walking fine.  discharge home with oral Abx.  *Acute asthma exacerbation.  oral prednisone. Nebulizers.  *Pancreatic cancer.  Follow-up at [facility].  *Hypokalemia.  Replaced orally.  *Leukocytosis due to recent Neupogen.  *DVT prophylaxis with Lovenox   DISCHARGE CONDITIONS:   Stable.  CONSULTS OBTAINED:    DRUG ALLERGIES:  No Known Allergies  DISCHARGE MEDICATIONS:   Allergies as of 08/08/2017   No Known Allergies     Medication List    TAKE these medications   acetaminophen 325 MG tablet Commonly known as:  TYLENOL Take 2 tablets (650 mg total) by mouth every 6 (six) hours as needed for mild pain or fever (headache).   albuterol 108 (90 Base) MCG/ACT inhaler Commonly known as:  PROVENTIL HFA;VENTOLIN HFA Inhale 1-2 puffs every 6 (six) hours as needed into the lungs for wheezing or shortness of breath.   levofloxacin 750 MG tablet Commonly known as:  LEVAQUIN Take 1 tablet (750 mg total) by mouth daily.   nystatin 100000  UNIT/ML suspension Commonly known as:  MYCOSTATIN Take 5 mLs (500,000 Units total) by mouth 4 (four) times daily for 7 days.   ondansetron 8 MG tablet Commonly known as:  ZOFRAN One pill every 8 hours as needed for nausea/vomitting.   predniSONE 50 MG tablet Commonly known as:  DELTASONE Take 1 tablet (50 mg total) by mouth daily with breakfast.        DISCHARGE INSTRUCTIONS:    Follow with PMD in 1-2 weeks.  If you experience worsening of your admission symptoms, develop shortness of breath, life threatening emergency, suicidal or homicidal thoughts you must seek medical attention immediately by calling 911 or calling your MD immediately  if symptoms less severe.  You Must read complete instructions/literature along with all the possible adverse reactions/side effects for all the Medicines you take and that have been prescribed to you. Take any new Medicines after you have completely understood and accept all the possible adverse reactions/side effects.   Please note  You were cared for by a hospitalist during your hospital stay. If you have any questions about your discharge medications or the care you received while you were in the hospital after you are discharged, you can call the unit and asked to speak with the hospitalist on call if the hospitalist that took care of you is not available. Once you are discharged, your primary care physician will handle any further medical issues. Please note that NO REFILLS for any discharge medications will be authorized once you are discharged, as  it is imperative that you return to your primary care physician (or establish a relationship with a primary care physician if you do not have one) for your aftercare needs so that they can reassess your need for medications and monitor your lab values.    Today   CHIEF COMPLAINT:   Chief Complaint  Patient presents with  . Cough  . Influenza    HISTORY OF PRESENT ILLNESS:  Nathaniel Saunders   is a 47 y.o. male with a known history of pancreatic cancer- was admitted for Influenza here and sent home 5 days ago. Today felt sweaty, cough and had fever with cough. Came back to ER, noted to have pneumonia and sepsis. So given to hospitalist for admission.   VITAL SIGNS:  Blood pressure 110/70, pulse 63, temperature 97.6 F (36.4 C), temperature source Oral, resp. rate 18, height 5\' 3"  (1.6 m), weight 63.5 kg (139 lb 14.4 oz), SpO2 100 %.  I/O:    Intake/Output Summary (Last 24 hours) at 08/08/2017 0738 Last data filed at 08/08/2017 0425 Gross per 24 hour  Intake 705 ml  Output -  Net 705 ml    PHYSICAL EXAMINATION:  GENERAL:  47 y.o.-year-old patient lying in the bed with no acute distress.  EYES: Pupils equal, round, reactive to light and accommodation. No scleral icterus. Extraocular muscles intact.  HEENT: Head atraumatic, normocephalic. Oropharynx and nasopharynx clear.  NECK:  Supple, no jugular venous distention. No thyroid enlargement, no tenderness.  LUNGS: Normal breath sounds bilaterally, no wheezing, rales,rhonchi or crepitation. No use of accessory muscles of respiration.  CARDIOVASCULAR: S1, S2 normal. No murmurs, rubs, or gallops.  ABDOMEN: Soft, non-tender, non-distended. Bowel sounds present. No organomegaly or mass.  EXTREMITIES: No pedal edema, cyanosis, or clubbing.  NEUROLOGIC: Cranial nerves II through XII are intact. Muscle strength 5/5 in all extremities. Sensation intact. Gait not checked.  PSYCHIATRIC: The patient is alert and oriented x 3.  SKIN: No obvious rash, lesion, or ulcer.   DATA REVIEW:   CBC Recent Labs  Lab 08/08/17 0544  WBC 16.2*  HGB 12.9*  HCT 38.1*  PLT 264    Chemistries  Recent Labs  Lab 08/06/17 1832 08/07/17 0510  NA 139 139  K 3.0* 3.6  CL 105 107  CO2 24 23  GLUCOSE 109* 103*  BUN 5* 7  CREATININE 0.69 0.63  CALCIUM 8.8* 8.3*  MG 1.4*  --   AST 29  --   ALT 27  --   ALKPHOS 117  --   BILITOT 1.0  --      Cardiac Enzymes No results for input(s): TROPONINI in the last 168 hours.  Microbiology Results  Results for orders placed or performed during the hospital encounter of 08/06/17  Culture, blood (routine x 2)     Status: None (Preliminary result)   Collection Time: 08/06/17  6:31 PM  Result Value Ref Range Status   Specimen Description BLOOD LEFT ANTECUBITAL  Final   Special Requests   Final    BOTTLES DRAWN AEROBIC AND ANAEROBIC Blood Culture adequate volume   Culture   Final    NO GROWTH 2 DAYS Performed at [facility], [address] [place], [place] [address]    Report Status PENDING  Incomplete  Culture, blood (routine x 2)     Status: None (Preliminary result)   Collection Time: 08/06/17  6:32 PM  Result Value Ref Range Status   Specimen Description BLOOD RIGHT ANTECUBITAL  Final   Special  Requests   Final    BOTTLES DRAWN AEROBIC AND ANAEROBIC Blood Culture adequate volume   Culture   Final    NO GROWTH 2 DAYS Performed at [facility], [address] [place], [place] [address]    Report Status PENDING  Incomplete    RADIOLOGY:  Dg Chest 2 View  Result Date: 08/06/2017 CLINICAL DATA:  47 year old with current history of pancreatic cancer, presenting with a 10 day history of cough. Former smoker. EXAM: CHEST - 2 VIEW COMPARISON:  Chest x-ray 07/31/2017.  CT chest 04/15/2017. FINDINGS: Cardiomediastinal silhouette unremarkable, unchanged. Bullous emphysematous changes in both lungs, left greater than right. Scattered areas of parenchymal scarring and fibrosis as noted previously. Increased patchy airspace opacity involving the left lower lobe, including the superior segment, superimposed upon the parenchymal scarring. No visible pleural effusions. Right jugular Port-A-Cath tip in the lower SVC near the cavoatrial junction, unchanged. Degenerative changes involving the lower thoracic spine. Common bile duct stent noted in the right upper quadrant of the  abdomen. 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 Electronically Signed   By: Evangeline Dakin M.D.   On: 08/06/2017 16:37    EKG:   Orders placed or performed during the hospital encounter of 07/31/17  . ED EKG  . ED EKG  . EKG      Management plans discussed with the patient, family and they are in agreement.  CODE STATUS:     Code Status Orders  (From admission, onward)        Start     Ordered   08/06/17 2138  Full code  Continuous     08/06/17 2137    Code Status History    Date Active Date Inactive Code Status Order ID Comments User Context   07/31/2017 17:22 08/01/2017 17:56 Full Code [id number]  Dustin Flock, MD ED   04/29/2017 17:29 04/30/2017 19:59 Full Code [id number]  Vaughan Basta, MD Inpatient   04/21/2017 13:22 04/22/2017 15:47 Full Code [id number]  Epifanio Lesches, MD ED    Advance Directive Documentation     Most Recent Value  Type of Advance Directive  Healthcare Power of Attorney  Pre-existing out of facility DNR order (yellow form or pink MOST form)  No data  "MOST" Form in Place?  No data      TOTAL TIME TAKING CARE OF THIS PATIENT: 35 minutes.    Vaughan Basta M.D on 08/08/2017 at 7:38 AM  Between 7am to 6pm - Pager - [phone]  After 6pm go to [email or web address] - password [email or web address] [facility]  Office  [phone]  CC: Primary care physician; Maeola Sarah, MD   Note: This dictation was prepared with Dragon dictation along with smaller phrase technology. Any transcriptional errors that result from this process are unintentional.

## 2017-08-10 ENCOUNTER — Inpatient Hospital Stay (HOSPITAL_BASED_OUTPATIENT_CLINIC_OR_DEPARTMENT_OTHER): Payer: Medicaid Other | Admitting: Internal Medicine

## 2017-08-10 ENCOUNTER — Ambulatory Visit: Payer: Self-pay

## 2017-08-10 ENCOUNTER — Encounter: Payer: Self-pay | Admitting: Internal Medicine

## 2017-08-10 ENCOUNTER — Inpatient Hospital Stay: Payer: Medicaid Other

## 2017-08-10 VITALS — BP 118/78 | HR 75 | Temp 98.1°F | Resp 16 | Wt 142.0 lb

## 2017-08-10 DIAGNOSIS — C251 Malignant neoplasm of body of pancreas: Secondary | ICD-10-CM | POA: Diagnosis not present

## 2017-08-10 DIAGNOSIS — J1008 Influenza due to other identified influenza virus with other specified pneumonia: Secondary | ICD-10-CM

## 2017-08-10 DIAGNOSIS — Z79899 Other long term (current) drug therapy: Secondary | ICD-10-CM

## 2017-08-10 DIAGNOSIS — Z800 Family history of malignant neoplasm of digestive organs: Secondary | ICD-10-CM

## 2017-08-10 DIAGNOSIS — J45909 Unspecified asthma, uncomplicated: Secondary | ICD-10-CM | POA: Diagnosis not present

## 2017-08-10 DIAGNOSIS — Z5111 Encounter for antineoplastic chemotherapy: Secondary | ICD-10-CM | POA: Diagnosis not present

## 2017-08-10 DIAGNOSIS — Z87891 Personal history of nicotine dependence: Secondary | ICD-10-CM

## 2017-08-10 DIAGNOSIS — Z7689 Persons encountering health services in other specified circumstances: Secondary | ICD-10-CM

## 2017-08-10 LAB — CBC WITH DIFFERENTIAL/PLATELET
Basophils Absolute: 0.1 10*3/uL (ref 0–0.1)
Basophils Relative: 0 %
EOS ABS: 0 10*3/uL (ref 0–0.7)
EOS PCT: 0 %
HCT: 36.5 % — ABNORMAL LOW (ref 40.0–52.0)
Hemoglobin: 12.9 g/dL — ABNORMAL LOW (ref 13.0–18.0)
LYMPHS ABS: 3 10*3/uL (ref 1.0–3.6)
Lymphocytes Relative: 25 %
MCH: 33.5 pg (ref 26.0–34.0)
MCHC: 35.4 g/dL (ref 32.0–36.0)
MCV: 94.5 fL (ref 80.0–100.0)
MONOS PCT: 15 %
Monocytes Absolute: 1.8 10*3/uL — ABNORMAL HIGH (ref 0.2–1.0)
Neutro Abs: 7.2 10*3/uL — ABNORMAL HIGH (ref 1.4–6.5)
Neutrophils Relative %: 60 %
PLATELETS: 335 10*3/uL (ref 150–440)
RBC: 3.87 MIL/uL — ABNORMAL LOW (ref 4.40–5.90)
RDW: 13.9 % (ref 11.5–14.5)
WBC: 12 10*3/uL — ABNORMAL HIGH (ref 3.8–10.6)

## 2017-08-10 LAB — COMPREHENSIVE METABOLIC PANEL
ALK PHOS: 91 U/L (ref 38–126)
ALT: 28 U/L (ref 17–63)
ANION GAP: 6 (ref 5–15)
AST: 21 U/L (ref 15–41)
Albumin: 3.2 g/dL — ABNORMAL LOW (ref 3.5–5.0)
BUN: 10 mg/dL (ref 6–20)
CALCIUM: 8.9 mg/dL (ref 8.9–10.3)
CO2: 28 mmol/L (ref 22–32)
Chloride: 104 mmol/L (ref 101–111)
Creatinine, Ser: 0.73 mg/dL (ref 0.61–1.24)
Glucose, Bld: 102 mg/dL — ABNORMAL HIGH (ref 65–99)
Potassium: 3.5 mmol/L (ref 3.5–5.1)
SODIUM: 138 mmol/L (ref 135–145)
Total Bilirubin: 0.7 mg/dL (ref 0.3–1.2)
Total Protein: 7.7 g/dL (ref 6.5–8.1)

## 2017-08-10 NOTE — Progress Notes (Signed)
[facility] CONSULT NOTE  Patient Care Team: Maeola Sarah, [name], RN as Registered Nurse  CHIEF COMPLAINTS/PURPOSE OF CONSULTATION: Pancreatic cancer  #  Oncology History   # November 2018- PANCREATIC ADENOCARCINOMA pancreatic neck mass [~2.5cm];  [name]; [name]; No mets  # Dec 4th 2018-FOLFIRINOX [neo-adj chemo]   # Nov 2018-biliary obstruction status post ERCP; [name]; [name]; [name]; [name], [name], [name], and PALB2 c.1655A>G (p.Gln552Arg. This is still considered a normal result.  # Molecular testing Not done         Malignant tumor of body of pancreas ([facility])   03/24/2017 Initial Diagnosis    Malignant tumor of body of pancreas ([facility])        HISTORY OF PRESENTING ILLNESS:  Nathaniel Saunders 47 y.o.  male with borderline resectable adenocarcinoma the pancreas-stage II is currently on neoadjuvant chemotherapy-FOLFIRINOX status post cycle #7approximately 2 weeks ago is here for follow-up.   in the interim patient had a CT scan pancreas protocol at [facility].   Patient was also seen in the emergency room for body aches/chills and also fevers-positive for influenza A.  Treated with Tamiflu.  Is also on Levaquin; noted to have nausea vomiting improved.  No shortness of breath or chest pain.  No fevers or chills.  Appetite is good.  No weight loss.  Denies any significant pain.  He is not on the pain patch.  ROS: A complete 10 point review of system is done which is negative except mentioned above in history of present illness  MEDICAL HISTORY:  Asthma  [well controlled] Past Medical History:  Diagnosis Date  . Asthma   . Genetic testing 05/20/2017   Multi-Cancer panel (83 genes) @ [facility] - No pathogenic mutations detected  . Pancreatic mass   . Pancreatic mass     SURGICAL HISTORY: Past Surgical History:  Procedure Laterality Date  . ERCP N/A 04/21/2017   Procedure: ENDOSCOPIC RETROGRADE CHOLANGIOPANCREATOGRAPHY (ERCP);  Surgeon: Lucilla Lame, MD;  Location: [facility] ENDOSCOPY;  Service: Endoscopy;  Laterality: N/A;  . ERCP N/A 05/06/2017   Procedure: ENDOSCOPIC RETROGRADE CHOLANGIOPANCREATOGRAPHY (ERCP);  Surgeon: Lucilla Lame, MD;  Location: [facility] ENDOSCOPY;  Service: Endoscopy;  Laterality: N/A;  . PORTA CATH INSERTION N/A 04/18/2017   Procedure: PORTA CATH INSERTION;  Surgeon: Algernon Huxley, MD;  Location: [facility] CV LAB;  Service: Cardiovascular;  Laterality: N/A;    SOCIAL HISTORY:  he lives in [place]; sister/girl friend; he has children aged  82/18/ 6 years;  quit smkoing - 5 months; alcohol- none; truck driver. Social History   Socioeconomic History  . Marital status: Single    Spouse name: Not on file  . Number of children: Not on file  . Years of education: Not on file  . Highest education level: Not on file  Social Needs  . Financial resource strain: Not hard at all  . Food insecurity - worry: Never true  . Food insecurity - inability: Never true  . Transportation needs - medical: No  . Transportation needs - non-medical: No  Occupational History  . Not on file  Tobacco Use  . Smoking status: Former Smoker    Packs/day:  0.25    Years: 5.00    Pack years: 1.25    Types: Cigarettes    Last attempt to quit: 05/07/2015    Years since quitting: 2.2  . Smokeless tobacco: Never Used  Substance and Sexual Activity  . Alcohol use: No  . Drug use: No  . Sexual activity: Yes    Partners: Male  Other Topics Concern  . Not on file  Social History Narrative  . Not on file    FAMILY HISTORY: 2 daughters [23  and 4]; boy- 69 years; one sister- 44 years. diagnosis of pancreatic cancer in mother; grandmother the patient's young age.  Family History  Problem Relation Age of Onset  . Pancreatic cancer Mother 72       deceased 34  . Diabetes Father   . Colon cancer Maternal Grandmother 83       deceased 59    ALLERGIES:  has No Known Allergies.  MEDICATIONS:  Current Outpatient Medications  Medication Sig Dispense Refill  . acetaminophen (TYLENOL) 325 MG tablet Take 2 tablets (650 mg total) by mouth every 6 (six) hours as needed for mild pain or fever (headache). 20 tablet 0  . albuterol (PROVENTIL HFA;VENTOLIN HFA) 108 (90 Base) MCG/ACT inhaler Inhale 1-2 puffs every 6 (six) hours as needed into the lungs for wheezing or shortness of breath.    . levofloxacin (LEVAQUIN) 750 MG tablet Take 1 tablet (750 mg total) by mouth daily. 6 tablet 0  . ondansetron (ZOFRAN) 8 MG tablet One pill every 8 hours as needed for nausea/vomitting. 30 tablet 0  . predniSONE (DELTASONE) 50 MG tablet Take 1 tablet (50 mg total) by mouth daily with breakfast. (Patient not taking: Reported on 08/10/2017) 4 tablet 0   No current facility-administered medications for this visit.    Facility-Administered Medications Ordered in Other Visits  Medication Dose Route Frequency Provider Last Rate Last Dose  . sodium chloride flush (NS) 0.9 % injection 10 mL  10 mL Intravenous PRN Cammie Sickle, MD   10 mL at 06/15/17 0941      .  PHYSICAL EXAMINATION: ECOG PERFORMANCE STATUS: 1 - Symptomatic but completely ambulatory  Vitals:   08/10/17 0859  BP: 118/78  Pulse: 75  Resp: 16  Temp: 98.1 F (36.7 C)   Filed Weights   08/10/17 0859  Weight: 142 lb (64.4 kg)    GENERAL: Well-nourished well-developed; Alert, no distress and comfortable.   Alone. EYES: no pallor or icterus OROPHARYNX: no thrush or ulceration; poor dentition  NECK: supple, no masses felt LYMPH:  no palpable lymphadenopathy in the cervical,  axillary or inguinal regions LUNGS: clear to auscultation and  No wheeze or crackles HEART/CVS: regular rate & rhythm and no murmurs; No lower extremity edema ABDOMEN: abdomen soft, non-tender and normal bowel sounds Musculoskeletal:no cyanosis of digits and no clubbing  PSYCH: alert & oriented x 3 with fluent speech NEURO: no focal motor/sensory deficits SKIN:  no rashes or significant lesions  LABORATORY DATA:  I have reviewed the data as listed Lab Results  Component Value Date   WBC 12.0 (H) 08/10/2017   HGB 12.9 (L) 08/10/2017   HCT 36.5 (L) 08/10/2017   MCV 94.5 08/10/2017   PLT 335 08/10/2017   Recent Labs    03/24/17 1537  04/22/17 0443 04/26/17 0840  07/31/17 1534  08/06/17 1832 08/07/17 0510 08/10/17 0836  NA  --    < > 137 136   < > 134*   < >  139 139 138  K  --    < > 3.8 3.3*   < > 3.8   < > 3.0* 3.6 3.5  CL  --    < > 109 103   < > 102   < > 105 107 104  CO2  --    < > 22 27   < > 23   < > 24 23 28   GLUCOSE  --    < > 94 158*   < > 103*   < > 109* 103* 102*  BUN  --    < > 8 10   < > 10   < > 5* 7 10  CREATININE  --    < > 0.64 0.74   < > 0.79   < > 0.69 0.63 0.73  CALCIUM  --    < > 8.7* 8.9   < > 9.0   < > 8.8* 8.3* 8.9  GFRNONAA  --    < > >60 >60   < > >60   < > >60 >60 >60  GFRAA  --    < > >60 >60   < > >60   < > >60 >60 >60  PROT 8.0   < > 6.5 7.7   < > 7.8  --  7.5  --  7.7  ALBUMIN 4.2   < > 2.9* 3.6   < > 4.1  --  3.6  --  3.2*  AST 21   < > 198* 64*   < > 31  --  29  --  21  ALT 13*   < > 512* 237*   < > 31  --  27  --  28  ALKPHOS 54   < > 417* 315*   < > 138*  --  117  --  91  BILITOT 0.6   < > 6.5* 3.7*   < > 1.0  --  1.0  --  0.7  BILIDIR 0.1  --  3.0* 1.8*  --   --   --   --   --   --   IBILI 0.5  --  3.5* 1.9*  --   --   --   --   --   --    < > = values in this interval not displayed.    RADIOGRAPHIC STUDIES: I have personally reviewed the radiological images as listed and agreed with the findings in the report. Dg Chest 2  View  Result Date: 08/06/2017 CLINICAL DATA:  47 year old with current history of pancreatic cancer, presenting with a 10 day history of cough. Former smoker. EXAM: CHEST - 2 VIEW COMPARISON:  Chest x-ray 07/31/2017.  CT chest 04/15/2017. FINDINGS: Cardiomediastinal silhouette unremarkable, unchanged. Bullous emphysematous changes in both lungs, left greater than right. Scattered areas of parenchymal scarring and fibrosis as noted previously. Increased patchy airspace opacity involving the left lower lobe, including the superior segment, superimposed upon the parenchymal scarring. No visible pleural effusions. Right jugular Port-A-Cath tip in the lower SVC near the cavoatrial junction, unchanged. Degenerative changes involving the lower thoracic spine. Common bile duct stent noted in the right upper quadrant of the abdomen. 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 Electronically Signed   By: Evangeline Dakin M.D.   On: 08/06/2017 16:37   Dg Chest 2 View  Result Date: 07/31/2017 CLINICAL DATA:  Chemotherapy 2 days ago for pancreatic carcinoma.  Fever, cough and shortness of breath since. EXAM: CHEST - 2 VIEW COMPARISON:  Chest CT, 04/15/2017 FINDINGS: Cardiac silhouette is normal in size. No mediastinal or hilar masses. No convincing adenopathy. Chronic areas of lung scarring. Lungs are hyperexpanded. No evidence of pneumonia or pulmonary edema. Right anterior chest wall Port-A-Cath has its tip in the lower superior vena cava. No pleural effusion or pneumothorax. Skeletal structures are intact. IMPRESSION: 1. No acute cardiopulmonary disease. 2. Areas of lung scarring stable when compared to the prior chest CT. Electronically Signed   By: Lajean Manes M.D.   On: 07/31/2017 16:17   IMPRESSION: Pancreatic mass again noted compatible with patient's known history of pancreatic malignancy. Interval exchange of plastic biliary  stent for a metallic stent. Decreasing biliary ductal dilatation. Mild pneumobilia.  Stable pancreatic ductal dilatation.  Stable scarring in the left lung base.   Electronically Signed   By: Rolm Baptise M.D.   On: 05/06/2017 20:58 ASSESSMENT & PLAN:   Malignant tumor of body of pancreas ([place]) # Pancreatic adenocarcinoma-borderline resectable; status post 7 cycle of neoadjuvant FOLFIRINOX; tolerated better.  CT scan done on March 14 at [facility]-ill-defined 2.4 x 1.1 cm pancreatic head mass noted-celiac axis not involved common hepatic artery/superior mesenteric artery portal vein superior mesenteric vein-short segment abutment less than 180 degree.   #Given the response to chemotherapy-recommend proceeding with the Whipple's surgery.  Discussed with Dr. Zenia Resides.  #Recent influenza A+ status post Tamiflu.  Improved no complications noted. Also on levaquin/zofran.  # Pain control-better controlled; off  fentanyl patch 25 g and also Percocet.    #Follow-up in approximately 22nd week of April 2019 [post whipples/labs]; no chemo.    All questions were answered. The patient knows to call the clinic with any problems, questions or concerns.    Cammie Sickle, MD 08/10/2017 9:20 AM

## 2017-08-10 NOTE — Assessment & Plan Note (Addendum)
#   Pancreatic adenocarcinoma-borderline resectable; status post 7 cycle of neoadjuvant FOLFIRINOX; tolerated better.  CT scan done on March 14 at [facility]-ill-defined 2.4 x 1.1 cm pancreatic head mass noted-celiac axis not involved common hepatic artery/superior mesenteric artery portal vein superior mesenteric vein-short segment abutment less than 180 degree.   #Given the response to chemotherapy-recommend proceeding with the Whipple's surgery as planned on April 2nd.   Discussed with Dr. Zenia Resides.  #Recent influenza A+ status post Tamiflu.  Improved no complications noted. Also on levaquin/zofran.  # Pain control-better controlled; off  fentanyl patch 25 g and also Percocet.    #Follow-up in approximately 22nd week of April 2019 [post whipples/labs]; no chemo.

## 2017-08-11 LAB — CULTURE, BLOOD (ROUTINE X 2)
CULTURE: NO GROWTH
Culture: NO GROWTH
SPECIAL REQUESTS: ADEQUATE
Special Requests: ADEQUATE

## 2017-08-12 ENCOUNTER — Telehealth: Payer: Self-pay | Admitting: *Deleted

## 2017-08-12 NOTE — Telephone Encounter (Signed)
Patient came by cancer center to speak to RN. He would like a note to go back to work for 1 week only. He would like to earn a little money before his whipple procedure. Pt made ware that Dr. B is not in the office. I did get his phone # and told him that I would speak to the NP on behalf of Dr. Rogue Bussing. Patient is truck Geophysicist/field seismologist. He is currently off all narcotics.  I personally spoke with Sonia Baller, NP. Np advised 1) pt is at risk for clots if he sits for prolonged periods of time. 2) patient needs to be healthy prior to under going his whipple. If patient has a clots, this could potentially set him back.  Pt made aware. He thanked me for calling him back with the decision. He stated that he didn't want to do anything to jeopardize his health.

## 2017-08-13 NOTE — ED Provider Notes (Signed)
[facility] Emergency Department Provider Note  ____________________________________________  Time seen: Approximately 4:56 PM  I have reviewed the triage vital signs and the nursing notes.   HISTORY  Chief Complaint Cough and Influenza   HPI Nathaniel Saunders is a 47 y.o. male who presents to the emergency department for evaluation and treatment of cough for the past 10 days.  He was recently diagnosed with influenza and was admitted at that time for sepsis as well.  He is a pancreatic cancer patient who is followed by Dr. Rogue Bussing.  He states that fever has been not well controlled with over-the-counter medications and his cough has become productive today.  Past Medical History:  Diagnosis Date  . Asthma   . Genetic testing 05/20/2017   Multi-Cancer panel (83 genes) @ [facility] - No pathogenic mutations detected  . Pancreatic mass   . Pancreatic mass     Patient Active Problem List   Diagnosis Date Noted  . Community acquired pneumonia 08/06/2017  . Pneumonia 08/06/2017  . Flu 07/31/2017  . Genetic testing 05/20/2017  . Pancreatic mass   . Obstruction of bile duct   . Port-A-Cath in place 04/29/2017  . Biliary obstruction 04/29/2017  . Obstructive jaundice due to malignant neoplasm ([place]) 04/21/2017  . Malignant neoplasm of pancreas ([place])   . Extrahepatic obstructive biliary disease   . Malignant tumor of body of pancreas ([place]) 03/24/2017    Past Surgical History:  Procedure Laterality Date  . ERCP N/A 04/21/2017   Procedure: ENDOSCOPIC RETROGRADE CHOLANGIOPANCREATOGRAPHY (ERCP);  Surgeon: Lucilla Lame, MD;  Location: [facility] ENDOSCOPY;  Service: Endoscopy;  Laterality: N/A;  . ERCP N/A 05/06/2017   Procedure: ENDOSCOPIC RETROGRADE CHOLANGIOPANCREATOGRAPHY (ERCP);  Surgeon: Lucilla Lame, MD;  Location: [facility] ENDOSCOPY;  Service: Endoscopy;  Laterality: N/A;  . PORTA CATH INSERTION N/A 04/18/2017   Procedure: PORTA CATH INSERTION;  Surgeon: Algernon Huxley, MD;  Location: [facility] CV LAB;  Service: Cardiovascular;  Laterality: N/A;    Prior to Admission medications   Medication Sig Start Date End Date Taking? Authorizing Provider  ondansetron (ZOFRAN) 8 MG tablet One pill every 8 hours as needed for nausea/vomitting. 08/01/17  Yes Sainani, Belia Heman, MD  acetaminophen (TYLENOL) 325 MG tablet Take 2 tablets (650 mg total) by mouth every 6 (six) hours as needed for mild pain or fever (headache). 08/08/17   Vaughan Basta, MD  albuterol (PROVENTIL HFA;VENTOLIN HFA) 108 (90 Base) MCG/ACT inhaler Inhale 1-2 puffs every 6 (six) hours as needed into the lungs for wheezing or shortness of breath.    [provider]  levofloxacin (LEVAQUIN) 750 MG tablet Take 1 tablet (750 mg total) by mouth daily. 08/08/17   Vaughan Basta, MD  predniSONE (DELTASONE) 50 MG tablet Take 1 tablet (50 mg total) by mouth daily with breakfast. Patient not taking: Reported on 08/10/2017 08/08/17   Hillary Bow, MD    Allergies Patient has no known allergies.  Family History  Problem Relation Age of Onset  . Pancreatic cancer Mother 58       deceased 60  . Diabetes Father   . Colon cancer Maternal Grandmother 30       deceased 23    Social History Social History   Tobacco Use  . Smoking status: Former Smoker    Packs/day: 0.25    Years: 5.00    Pack years: 1.25    Types: Cigarettes    Last attempt to quit: 05/07/2015    Years since quitting: 2.2  .  Smokeless tobacco: Never Used  Substance Use Topics  . Alcohol use: No  . Drug use: No    Review of Systems Constitutional: Negative for fever/chills ENT: Negative for sore throat. Cardiovascular: Denies chest pain. Respiratory: Positive for shortness of breath. Positive for cough. Gastrointestinal: negative for nausea,  no vomiting.  no diarrhea.  Musculoskeletal: Positive for body aches Skin: Negative for rash. Neurological: Negative for  headaches ____________________________________________   PHYSICAL EXAM:  VITAL SIGNS: ED Triage Vitals  Enc Vitals Group     BP 08/06/17 1539 121/78     Pulse Rate 08/06/17 1539 92     Resp 08/06/17 2050 20     Temp 08/06/17 1539 (!) 102.2 F (39 C)     Temp Source 08/06/17 1539 Oral     SpO2 08/06/17 1539 99 %     Weight 08/06/17 1539 150 lb (68 kg)     Height 08/06/17 1539 5\' 3"  (1.6 m)     Head Circumference --      Peak Flow --      Pain Score 08/06/17 2050 0     Pain Loc --      Pain Edu? --      Excl. in [place]? --     Constitutional: Alert and oriented. Acutely ill appearing and in no acute distress. Eyes: Conjunctivae are normal. EOMI. Ears: Bilateral TM normal Nose: No congestion noted; no rhinnorhea. Mouth/Throat: Mucous membranes are moist.  Oropharynx normal. Neck: No stridor.  Lymphatic: No cervical lymphadenopathy. Cardiovascular: Normal rate, regular rhythm. Good peripheral circulation. Respiratory: Normal respiratory effort.  No retractions.  Rhonchi noted in bilateral bases on auscultation. Gastrointestinal: Soft and nontender.  Musculoskeletal: FROM x 4 extremities.  Neurologic:  Normal speech and language.  Skin:  Skin is warm, dry and intact. No rash noted. Psychiatric: Mood and affect are normal. Speech and behavior are normal.  ____________________________________________   LABS (all labs ordered are listed, but only abnormal results are displayed)  Labs Reviewed  CBC WITH DIFFERENTIAL/PLATELET - Abnormal; Notable for the following components:      Result Value   WBC 19.8 (*)    RBC 3.99 (*)    Hemoglobin 12.8 (*)    HCT 37.6 (*)    Neutro Abs 15.4 (*)    Monocytes Absolute 2.4 (*)    All other components within normal limits  COMPREHENSIVE METABOLIC PANEL - Abnormal; Notable for the following components:   Potassium 3.0 (*)    Glucose, Bld 109 (*)    BUN 5 (*)    Calcium 8.8 (*)    All other components within normal limits  MAGNESIUM -  Abnormal; Notable for the following components:   Magnesium 1.4 (*)    All other components within normal limits  BASIC METABOLIC PANEL - Abnormal; Notable for the following components:   Glucose, Bld 103 (*)    Calcium 8.3 (*)    All other components within normal limits  CBC - Abnormal; Notable for the following components:   WBC 17.0 (*)    RBC 3.61 (*)    Hemoglobin 12.0 (*)    HCT 33.8 (*)    All other components within normal limits  CBC WITH DIFFERENTIAL/PLATELET - Abnormal; Notable for the following components:   WBC 16.2 (*)    RBC 4.00 (*)    Hemoglobin 12.9 (*)    HCT 38.1 (*)    Neutro Abs 11.5 (*)    Monocytes Absolute 2.1 (*)    All other components  within normal limits  CULTURE, BLOOD (ROUTINE X 2)  CULTURE, BLOOD (ROUTINE X 2)  LACTIC ACID, PLASMA  INFLUENZA PANEL BY PCR (TYPE A & B)   ____________________________________________  EKG  Not indicated ____________________________________________  RADIOLOGY  Left lower lobe pneumonia is suspected which is superimposed upon COPD and extensive scarring in both lungs per radiology ____________________________________________   PROCEDURES  Procedure(s) performed: None  Critical Care performed: No ____________________________________________   INITIAL IMPRESSION / ASSESSMENT AND PLAN / ED COURSE  47 y.o. male who presents to the emergency department for treatment and evaluation of fever and cough.  Symptoms are concerning for healthcare acquired pneumonia.  Patient was discharged on 08/01/2017 after treatment for influenza and sepsis.  Labs and chest x-ray will be obtained.  Differential diagnosis includes but is not limited to influenza, sepsis, and metastatic cancer.  Chest x-ray does show concern for pneumonia.  Patient will be admitted for IV antibiotics.  Blood cultures were drawn prior to administration of antibiotics.  Case was discussed with Dr. Anselm Jungling who accepts the patient for  admission.   Medications  ibuprofen (ADVIL,MOTRIN) 100 MG/5ML suspension 600 mg (600 mg Oral Given 08/06/17 1549)  levofloxacin (LEVAQUIN) IVPB 750 mg (0 mg Intravenous Stopped 08/06/17 2109)  piperacillin-tazobactam (ZOSYN) IVPB 3.375 g (0 g Intravenous Stopped 08/06/17 1918)  ondansetron (ZOFRAN) injection 4 mg (4 mg Intravenous Given 08/06/17 1856)  vancomycin (VANCOCIN) IVPB 1000 mg/200 mL premix (0 mg Intravenous Stopped 08/06/17 2109)  sodium chloride 0.9 % bolus 1,000 mL (0 mLs Intravenous Stopped 08/06/17 2105)  heparin lock flush 100 unit/mL (500 Units Intravenous Given 08/08/17 0803)    ED Discharge Orders        Ordered    predniSONE (DELTASONE) 50 MG tablet  Daily with breakfast     08/07/17 1120    levofloxacin (LEVAQUIN) 750 MG tablet  Daily,   Status:  Discontinued     08/07/17 1120    acetaminophen (TYLENOL) 325 MG tablet  Every 6 hours PRN     08/08/17 0738    levofloxacin (LEVAQUIN) 750 MG tablet  Daily     08/08/17 0738    Increase activity slowly     08/08/17 0738    Diet - low sodium heart healthy     08/08/17 0738       Pertinent labs & imaging results that were available during my care of the patient were reviewed by me and considered in my medical decision making (see chart for details).    If controlled substance prescribed during this visit, 12 month history viewed on the [place] prior to issuing an initial prescription for Schedule II or III opiod. ____________________________________________   FINAL CLINICAL IMPRESSION(S) / ED DIAGNOSES  Final diagnoses:  HCAP (healthcare-associated pneumonia)    Note:  This document was prepared using Dragon voice recognition software and may include unintentional dictation errors.     Victorino Dike, FNP 08/13/17 1706    Carrie Mew, MD 08/26/17 [phone]

## 2017-08-26 ENCOUNTER — Telehealth: Payer: Self-pay

## 2017-08-26 NOTE — Telephone Encounter (Signed)
Surgery postponed due to:  due to recent flu and pneumonia treating with steroids, surgery will be delayed a few weeks to allow him to build up some strength.   Rescheduled for 09/13/17  Oncology Nurse Navigator Documentation  Navigator Location: [facility] (08/26/17 1600)   )Navigator Encounter Type: Other (08/26/17 1600) Telephone: Patient Update (08/26/17 1600)                                                  Time Spent with Patient: 15 (08/26/17 1600)

## 2017-09-06 HISTORY — PX: OTHER SURGICAL HISTORY: SHX169

## 2017-09-15 ENCOUNTER — Other Ambulatory Visit: Payer: Self-pay

## 2017-09-15 ENCOUNTER — Inpatient Hospital Stay (HOSPITAL_BASED_OUTPATIENT_CLINIC_OR_DEPARTMENT_OTHER): Payer: Medicaid Other | Admitting: Internal Medicine

## 2017-09-15 ENCOUNTER — Inpatient Hospital Stay: Payer: Medicaid Other | Attending: Internal Medicine

## 2017-09-15 ENCOUNTER — Encounter: Payer: Self-pay | Admitting: Internal Medicine

## 2017-09-15 VITALS — BP 122/82 | HR 81 | Temp 97.9°F | Resp 18 | Ht 63.0 in | Wt 140.0 lb

## 2017-09-15 DIAGNOSIS — Z7984 Long term (current) use of oral hypoglycemic drugs: Secondary | ICD-10-CM | POA: Diagnosis not present

## 2017-09-15 DIAGNOSIS — J45909 Unspecified asthma, uncomplicated: Secondary | ICD-10-CM | POA: Insufficient documentation

## 2017-09-15 DIAGNOSIS — Z87891 Personal history of nicotine dependence: Secondary | ICD-10-CM | POA: Insufficient documentation

## 2017-09-15 DIAGNOSIS — Z9221 Personal history of antineoplastic chemotherapy: Secondary | ICD-10-CM

## 2017-09-15 DIAGNOSIS — C251 Malignant neoplasm of body of pancreas: Secondary | ICD-10-CM

## 2017-09-15 DIAGNOSIS — Z800 Family history of malignant neoplasm of digestive organs: Secondary | ICD-10-CM

## 2017-09-15 DIAGNOSIS — Z95828 Presence of other vascular implants and grafts: Secondary | ICD-10-CM

## 2017-09-15 LAB — CBC WITH DIFFERENTIAL/PLATELET
Basophils Absolute: 0 10*3/uL (ref 0–0.1)
Basophils Relative: 1 %
EOS PCT: 4 %
Eosinophils Absolute: 0.2 10*3/uL (ref 0–0.7)
HCT: 36.2 % — ABNORMAL LOW (ref 40.0–52.0)
HEMOGLOBIN: 12.7 g/dL — AB (ref 13.0–18.0)
LYMPHS PCT: 43 %
Lymphs Abs: 2.8 10*3/uL (ref 1.0–3.6)
MCH: 32.7 pg (ref 26.0–34.0)
MCHC: 35.1 g/dL (ref 32.0–36.0)
MCV: 93.2 fL (ref 80.0–100.0)
Monocytes Absolute: 0.6 10*3/uL (ref 0.2–1.0)
Monocytes Relative: 9 %
NEUTROS PCT: 43 %
Neutro Abs: 2.8 10*3/uL (ref 1.4–6.5)
Platelets: 370 10*3/uL (ref 150–440)
RBC: 3.88 MIL/uL — AB (ref 4.40–5.90)
RDW: 13.1 % (ref 11.5–14.5)
WBC: 6.5 10*3/uL (ref 3.8–10.6)

## 2017-09-15 LAB — COMPREHENSIVE METABOLIC PANEL
ALT: 27 U/L (ref 17–63)
AST: 28 U/L (ref 15–41)
Albumin: 3.8 g/dL (ref 3.5–5.0)
Alkaline Phosphatase: 58 U/L (ref 38–126)
Anion gap: 8 (ref 5–15)
BUN: 6 mg/dL (ref 6–20)
CHLORIDE: 104 mmol/L (ref 101–111)
CO2: 24 mmol/L (ref 22–32)
Calcium: 9.3 mg/dL (ref 8.9–10.3)
Creatinine, Ser: 0.72 mg/dL (ref 0.61–1.24)
GFR calc Af Amer: >60 mL/min (ref >60)
GFR calc non Af Amer: >60 mL/min (ref >60)
Glucose, Bld: 128 mg/dL — ABNORMAL HIGH (ref 65–99)
POTASSIUM: 3.7 mmol/L (ref 3.5–5.1)
SODIUM: 136 mmol/L (ref 135–145)
Total Bilirubin: 0.5 mg/dL (ref 0.3–1.2)
Total Protein: 7.7 g/dL (ref 6.5–8.1)

## 2017-09-15 MED ORDER — HEPARIN SOD (PORK) LOCK FLUSH 100 UNIT/ML IV SOLN
500.0000 [IU] | INTRAVENOUS | Status: AC | PRN
  Administered 2017-09-15: 500 [IU]

## 2017-09-15 MED ORDER — SODIUM CHLORIDE 0.9% FLUSH
10.0000 mL | INTRAVENOUS | Status: AC | PRN
  Administered 2017-09-15: 10 mL
  Filled 2017-09-15: qty 10

## 2017-09-15 NOTE — Assessment & Plan Note (Addendum)
#   Pancreatic adenocarcinoma-borderline resectable; status post 7 cycle of neoadjuvant FOLFIRINOX; tolerated better.  Status post Whipple's-tolerated procedure well.  Surgical pathology-pending  #We will resume systemic chemotherapy in the next 2 weeks-based upon the results of the surgical pathology.Marland Kitchen   #Continue DVT prophylaxis.   # Pain control-better controlled; off  fentanyl patch 25 g and also Percocet.    # follow up on may 13th to start [place].

## 2017-09-15 NOTE — Progress Notes (Signed)
[facility] CONSULT NOTE  Patient Care Team: Maeola Sarah, [name], RN as Registered Nurse  CHIEF COMPLAINTS/PURPOSE OF CONSULTATION: Pancreatic cancer  #  Oncology History   # November 2018- PANCREATIC ADENOCARCINOMA pancreatic neck mass [~2.5cm];  [name]; [name]; [name]; [name]; [facility]]   ---------------------------------------------------    # Nov 2018-biliary obstruction status post ERCP; [name]; [name]; [name]; [name], [name], [name], and PALB2 c.1655A>G (p.Gln552Arg. This is still considered a normal result.  # Molecular testing Not done         Malignant tumor of body of pancreas ([place])     HISTORY OF PRESENTING ILLNESS:  Nathaniel Saunders 47 y.o.  male with borderline resectable adenocarcinoma the pancreas-stage II is currently on neoadjuvant chemotherapy-FOLFIRINOX status post cycle #7; status post Whipple's on April 14 is here for follow-up.  Patient has done extremely well post procedure.  Patient was discharged from the hospital approximately after 4 days.  He denies any abdominal pain.  He still has staples in.  No diarrhea.  Is currently not on any pain medications.  ROS: A complete 10 point review of system is done which is negative except mentioned above in history of present illness  MEDICAL HISTORY:  Asthma [well controlled] Past Medical History:  Diagnosis Date  . Asthma   . Genetic testing 05/20/2017   Multi-Cancer panel (83 genes) @ [facility] - No pathogenic  mutations detected  . Pancreatic cancer ([place])   . Pancreatic mass     SURGICAL HISTORY: Past Surgical History:  Procedure Laterality Date  . ERCP N/A 04/21/2017   Procedure: ENDOSCOPIC RETROGRADE CHOLANGIOPANCREATOGRAPHY (ERCP);  Surgeon: Lucilla Lame, MD;  [facility] ENDOSCOPY;  Service: Endoscopy;  Laterality: N/A;  . ERCP N/A 05/06/2017   Procedure: ENDOSCOPIC RETROGRADE CHOLANGIOPANCREATOGRAPHY (ERCP);  Surgeon: Lucilla Lame, MD;  Location: [facility] ENDOSCOPY;  Service: Endoscopy;  Laterality: N/A;  . PANCREATECTOMY, PROXIMAL SUBTOTAL WITH TOTAL DUODENECTOMY, PARTIAL GASTRECTOMY, CHOLEDOCHOENTEROSTOMY AND GASTROJEJUNOSTOMY (WHIPPLE-TYPE PROCEDURE); WITHOUT PANCREATOJEJUNOSTOMY  09/06/2017  . PORTA CATH INSERTION N/A 04/18/2017   Procedure: PORTA CATH INSERTION;  Surgeon: Algernon Huxley, MD;  Location: [place] CV LAB;  Service: Cardiovascular;  Laterality: N/A;    SOCIAL HISTORY:  he lives in [place]; sister/girl friend; he has children aged  49/18/ 6 years;  quit smkoing - 5 months; alcohol- none; truck driver. Social History   Socioeconomic History  . Marital status: Single    Spouse name: Not on file  . Number of children: Not on file  . Years of education: Not on file  . Highest education level: Not on file  Occupational History  . Not on file  Social Needs  . Financial resource strain: Not hard at all  . Food insecurity:    Worry: Never true    Inability: Never true  . Transportation needs:    Medical: No    Non-medical: No  Tobacco Use  . Smoking status: Former Smoker    Packs/day: 0.25    Years: 5.00    Pack years: 1.25    Types: Cigarettes    Last attempt to quit: 05/07/2015  Years since quitting: 2.4  . Smokeless tobacco: Never Used  Substance and Sexual Activity  . Alcohol use: No  . Drug use: No  . Sexual activity: Yes    Partners: Male  Lifestyle  . Physical activity:    Days per week: 0 days    Minutes per session: 0 min  . Stress: Only a  little  Relationships  . Social connections:    Talks on phone: More than three times a week    Gets together: More than three times a week    Attends religious service: 1 to 4 times per year    Active member of club or organization: No    Attends meetings of clubs or organizations: Never    Relationship status: Living with partner  . Intimate partner violence:    Fear of current or ex partner: No    Emotionally abused: No    Physically abused: No    Forced sexual activity: No  Other Topics Concern  . Not on file  Social History Narrative  . Not on file    FAMILY HISTORY: 2 daughters [23 and 42]; boy- 66 years; one sister- 74 years. diagnosis of pancreatic cancer in mother; grandmother the patient's young age.  Family History  Problem Relation Age of Onset  . Pancreatic cancer Mother 56       deceased 57  . Diabetes Father   . Colon cancer Maternal Grandmother 107       deceased 85    ALLERGIES:  has No Known Allergies.  MEDICATIONS:  Current Outpatient Medications  Medication Sig Dispense Refill  . Ascorbic Acid (VITA-C PO) Take 1,000 mg by mouth daily.    Marland Kitchen enoxaparin (LOVENOX) 40 MG/0.4ML injection Inject 40 mg into the skin daily.    Marland Kitchen gabapentin (NEURONTIN) 300 MG capsule Take 1 capsule by mouth every 8 (eight) hours. For pain    . ondansetron (ZOFRAN) 8 MG tablet One pill every 8 hours as needed for nausea/vomitting. 30 tablet 0  . pantoprazole (PROTONIX) 40 MG tablet Take 1 tablet by mouth daily.    Marland Kitchen acetaminophen (TYLENOL) 325 MG tablet Take 2 tablets (650 mg total) by mouth every 6 (six) hours as needed for mild pain or fever (headache). (Patient not taking: Reported on 09/15/2017) 20 tablet 0  . albuterol (PROVENTIL HFA;VENTOLIN HFA) 108 (90 Base) MCG/ACT inhaler Inhale 1-2 puffs every 6 (six) hours as needed into the lungs for wheezing or shortness of breath.     No current facility-administered medications for this visit.    Facility-Administered Medications  Ordered in Other Visits  Medication Dose Route Frequency Provider Last Rate Last Dose  . sodium chloride flush (NS) 0.9 % injection 10 mL  10 mL Intravenous PRN Cammie Sickle, MD   10 mL at 06/15/17 0941      .  PHYSICAL EXAMINATION: ECOG PERFORMANCE STATUS: 1 - Symptomatic but completely ambulatory  Vitals:   09/15/17 1000  BP: 122/82  Pulse: 81  Resp: 18  Temp: 97.9 F (36.6 C)   Filed Weights   09/15/17 1008  Weight: 140 lb (63.5 kg)    GENERAL: Well-nourished well-developed; Alert, no distress and comfortable.   Alone. EYES: no pallor or icterus OROPHARYNX: no thrush or ulceration; poor dentition  NECK: supple, no masses felt LYMPH:  no palpable lymphadenopathy in the cervical, axillary or inguinal regions LUNGS: clear to auscultation and  No wheeze or crackles HEART/CVS: regular rate & rhythm and no murmurs; No lower  extremity edema ABDOMEN: abdomen soft, non-tender and normal bowel sounds Musculoskeletal:no cyanosis of digits and no clubbing  PSYCH: alert & oriented x 3 with fluent speech NEURO: no focal motor/sensory deficits SKIN:  no rashes or significant lesions  LABORATORY DATA:  I have reviewed the data as listed Lab Results  Component Value Date   WBC 6.5 09/15/2017   HGB 12.7 (L) 09/15/2017   HCT 36.2 (L) 09/15/2017   MCV 93.2 09/15/2017   PLT 370 09/15/2017   Recent Labs    03/24/17 1537  04/22/17 0443 04/26/17 0840  08/06/17 1832 08/07/17 0510 08/10/17 0836 09/15/17 0944  NA  --    < > 137 136   < > 139 139 138 136  K  --    < > 3.8 3.3*   < > 3.0* 3.6 3.5 3.7  CL  --    < > 109 103   < > 105 107 104 104  CO2  --    < > 22 27   < > 24 23 28 24   GLUCOSE  --    < > 94 158*   < > 109* 103* 102* 128*  BUN  --    < > 8 10   < > 5* 7 10 6   CREATININE  --    < > 0.64 0.74   < > 0.69 0.63 0.73 0.72  CALCIUM  --    < > 8.7* 8.9   < > 8.8* 8.3* 8.9 9.3  GFRNONAA  --    < > >60 >60   < > >60 >60 >60 >60  GFRAA  --    < > >60 >60   < > >60  >60 >60 >60  PROT 8.0   < > 6.5 7.7   < > 7.5  --  7.7 7.7  ALBUMIN 4.2   < > 2.9* 3.6   < > 3.6  --  3.2* 3.8  AST 21   < > 198* 64*   < > 29  --  21 28  ALT 13*   < > 512* 237*   < > 27  --  28 27  ALKPHOS 54   < > 417* 315*   < > 117  --  91 58  BILITOT 0.6   < > 6.5* 3.7*   < > 1.0  --  0.7 0.5  BILIDIR 0.1  --  3.0* 1.8*  --   --   --   --   --   IBILI 0.5  --  3.5* 1.9*  --   --   --   --   --    < > = values in this interval not displayed.    RADIOGRAPHIC STUDIES: I have personally reviewed the radiological images as listed and agreed with the findings in the report. No results found. IMPRESSION: Pancreatic mass again noted compatible with patient's known history of pancreatic malignancy. Interval exchange of plastic biliary stent for a metallic stent. Decreasing biliary ductal dilatation. Mild pneumobilia.  Stable pancreatic ductal dilatation.  Stable scarring in the left lung base.   Electronically Signed   By: Rolm Baptise M.D.   On: 05/06/2017 20:58 ASSESSMENT & PLAN:   Malignant tumor of body of pancreas ([place]) # Pancreatic adenocarcinoma-borderline resectable; status post 7 cycle of neoadjuvant FOLFIRINOX; tolerated better.  Status post Whipple's-tolerated procedure well.  Surgical pathology-pending  #We will resume systemic chemotherapy in the next 2 weeks-based upon the results of  the surgical pathology.Marland Kitchen   #Continue DVT prophylaxis.   # Pain control-better controlled; off  fentanyl patch 25 g and also Percocet.    # follow up on may 13th to start [place].   All questions were answered. The patient knows to call the clinic with any problems, questions or concerns.    Cammie Sickle, MD 10/02/2017 8:19 PM

## 2017-10-03 ENCOUNTER — Inpatient Hospital Stay: Payer: Medicaid Other | Admitting: Internal Medicine

## 2017-10-03 ENCOUNTER — Inpatient Hospital Stay: Payer: Medicaid Other

## 2017-10-05 ENCOUNTER — Telehealth: Payer: Self-pay | Admitting: Internal Medicine

## 2017-10-05 ENCOUNTER — Inpatient Hospital Stay: Payer: Medicaid Other

## 2017-10-05 ENCOUNTER — Encounter: Payer: Self-pay | Admitting: Internal Medicine

## 2017-10-05 ENCOUNTER — Inpatient Hospital Stay (HOSPITAL_BASED_OUTPATIENT_CLINIC_OR_DEPARTMENT_OTHER): Payer: Medicaid Other | Admitting: Internal Medicine

## 2017-10-05 ENCOUNTER — Other Ambulatory Visit: Payer: Self-pay

## 2017-10-05 ENCOUNTER — Inpatient Hospital Stay: Payer: Medicaid Other | Attending: Internal Medicine

## 2017-10-05 VITALS — BP 126/80 | HR 60 | Temp 97.9°F | Resp 20 | Ht 63.0 in | Wt 137.0 lb

## 2017-10-05 DIAGNOSIS — C251 Malignant neoplasm of body of pancreas: Secondary | ICD-10-CM

## 2017-10-05 DIAGNOSIS — Z800 Family history of malignant neoplasm of digestive organs: Secondary | ICD-10-CM

## 2017-10-05 DIAGNOSIS — Z87891 Personal history of nicotine dependence: Secondary | ICD-10-CM

## 2017-10-05 DIAGNOSIS — Z79899 Other long term (current) drug therapy: Secondary | ICD-10-CM

## 2017-10-05 DIAGNOSIS — J45909 Unspecified asthma, uncomplicated: Secondary | ICD-10-CM

## 2017-10-05 LAB — CBC WITH DIFFERENTIAL/PLATELET
BASOS PCT: 1 %
Basophils Absolute: 0 10*3/uL (ref 0–0.1)
EOS PCT: 2 %
Eosinophils Absolute: 0.1 10*3/uL (ref 0–0.7)
HEMATOCRIT: 37.9 % — AB (ref 40.0–52.0)
Hemoglobin: 13 g/dL (ref 13.0–18.0)
Lymphocytes Relative: 58 %
Lymphs Abs: 3 10*3/uL (ref 1.0–3.6)
MCH: 31.8 pg (ref 26.0–34.0)
MCHC: 34.4 g/dL (ref 32.0–36.0)
MCV: 92.4 fL (ref 80.0–100.0)
MONOS PCT: 8 %
Monocytes Absolute: 0.4 10*3/uL (ref 0.2–1.0)
NEUTROS ABS: 1.6 10*3/uL (ref 1.4–6.5)
Neutrophils Relative %: 31 %
PLATELETS: 250 10*3/uL (ref 150–440)
RBC: 4.1 MIL/uL — ABNORMAL LOW (ref 4.40–5.90)
RDW: 13.4 % (ref 11.5–14.5)
WBC: 5.1 10*3/uL (ref 3.8–10.6)

## 2017-10-05 LAB — COMPREHENSIVE METABOLIC PANEL
ALBUMIN: 3.8 g/dL (ref 3.5–5.0)
ALT: 13 U/L — ABNORMAL LOW (ref 17–63)
ANION GAP: 8 (ref 5–15)
AST: 23 U/L (ref 15–41)
Alkaline Phosphatase: 52 U/L (ref 38–126)
BILIRUBIN TOTAL: 1 mg/dL (ref 0.3–1.2)
BUN: 6 mg/dL (ref 6–20)
CHLORIDE: 105 mmol/L (ref 101–111)
CO2: 26 mmol/L (ref 22–32)
Calcium: 9.2 mg/dL (ref 8.9–10.3)
Creatinine, Ser: 0.86 mg/dL (ref 0.61–1.24)
GFR calc Af Amer: >60 mL/min (ref >60)
GLUCOSE: 104 mg/dL — AB (ref 65–99)
POTASSIUM: 3.8 mmol/L (ref 3.5–5.1)
Sodium: 139 mmol/L (ref 135–145)
TOTAL PROTEIN: 7.3 g/dL (ref 6.5–8.1)

## 2017-10-05 MED ORDER — TRAMADOL HCL 50 MG PO TABS: 50.0000 mg | ORAL_TABLET | Freq: Three times a day (TID) | ORAL | 0 refills | Status: DC | PRN

## 2017-10-05 MED ORDER — AMOXICILL-CLARITHRO-LANSOPRAZ PO MISC: Freq: Two times a day (BID) | ORAL | 0 refills | Status: DC

## 2017-10-05 NOTE — Telephone Encounter (Signed)
I spoke with the patient and made him aware that his [facility] bx also demonstrated H-pylori. He stated that he was not aware of this previously and no one had told him. I asked him to pick up the antibiotics and take this as directed. He gave verbal understanding of the plan of care

## 2017-10-05 NOTE — Assessment & Plan Note (Addendum)
#  Pancreatic adenocarcinoma stage II; status post Whipple's [September 01, 2017].  Post Whipple-pathology shows pT2pN1; close but negative margins.  #Given the suboptimal response to neoadjuvant chemotherapy with FOLFIRINOX-recommend switching adjuvant therapy to gemcitabine Abraxane/gemcitabine Xeloda based on adjuvant therapy data.  Also discussed with Dr. Shon Hough from [place].  #Discussed the schedule; and the potential side effects of each therapy.  Discussed with Ebony Hail from pharmacy regarding co-pay assistance for Xeloda.  # Pain control-better controlled;stable; continue tramdaol    # H.Pylori-IHC positive on stomach biopsy; recommend Prevpac. Will send to pharmacy.   # follow gem-abraxane in 1 week/ labs/MD/Gem in 2 weeks.   # 40 minutes face-to-face with the patient discussing the above plan of care; more than 50% of time spent on prognosis/ natural history; counseling and coordination.

## 2017-10-05 NOTE — Telephone Encounter (Signed)
Patient called back at 1705. States that his pharmacy could order this medication for him and have it available at 2pm tomorrow. I told the pt that this would be ok. He was worried that he needed to get started on this today. I told him tom. Would be fine.

## 2017-10-05 NOTE — Progress Notes (Signed)
Patient here for follow-up for pancreatic cancer. Pt states that he ran out of tramadol and would like Dr. B to renew this. He has been using emla cream to topicaly treat the incisional area. Rates pain 4/10 today. Pt denies any N&V and diarrhea. Patient states that has 3 stools- soft/formed per day. Patient using stool softners as directed.

## 2017-10-05 NOTE — Telephone Encounter (Signed)
Please inform patient that he was found to have " infection in his stomach" surgery [facility] pylori]-recommend antibiotics to treat it.  I have sent a prescription to his pharmacy.

## 2017-10-05 NOTE — Progress Notes (Signed)
[name], [name], 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; [name]; [name]; [name]; [name]; [name];   [name]; [name]; [name]; [name]; [name], [name], [name], [name]     INTERVAL HISTORY:  Nathaniel Saunders 47 y.o.  male pleasant patient above history of pancreatic cancer stage II is currently status post Whipple's post neoadjuvant chemotherapy is here for follow-up.  Patient admits to mild loose stools but no diarrhea.  Appetite is good.  No nausea no vomiting.  Complains of mild abdominal pain at the site of surgery.  Review of Systems  Constitutional: Negative for chills, diaphoresis, fever, malaise/fatigue and weight loss.  HENT: Negative for nosebleeds and sore throat.   Eyes: Negative for double vision.  Respiratory: Negative for cough, hemoptysis, sputum production, shortness of breath and wheezing.   Cardiovascular: Negative for chest pain, palpitations, orthopnea and  leg swelling.  Gastrointestinal: Positive for abdominal pain. Negative for blood in stool, constipation, diarrhea, heartburn, melena, nausea and vomiting.  Genitourinary: Negative for dysuria, frequency and urgency.  Musculoskeletal: Negative for back pain and joint pain.  Skin: Negative.  Negative for itching and rash.  Neurological: Negative for dizziness, tingling, focal weakness, weakness and headaches.  Endo/Heme/Allergies: Does not bruise/bleed easily.  Psychiatric/Behavioral: Negative for depression. The patient is not nervous/anxious and does not have insomnia.       PAST MEDICAL HISTORY :  Past Medical History:  Diagnosis Date  . Asthma   . Genetic testing 05/20/2017   Multi-Cancer panel (83 genes) @ [facility] - No pathogenic mutations detected  . Pancreatic cancer ([place])   . Pancreatic mass     PAST SURGICAL HISTORY :   Past Surgical History:  Procedure Laterality Date  . ERCP N/A 04/21/2017   Procedure: ENDOSCOPIC RETROGRADE CHOLANGIOPANCREATOGRAPHY (ERCP);  Surgeon: Lucilla Lame, MD;  Location[facility] ENDOSCOPY;  Service: Endoscopy;  Laterality: N/A;  . ERCP N/A 05/06/2017   Procedure: ENDOSCOPIC RETROGRADE CHOLANGIOPANCREATOGRAPHY (ERCP);  Surgeon: Lucilla Lame, MD;  Location: [facility] ENDOSCOPY;  Service: Endoscopy;  Laterality: N/A;  . PANCREATECTOMY, PROXIMAL SUBTOTAL WITH TOTAL DUODENECTOMY, PARTIAL GASTRECTOMY, CHOLEDOCHOENTEROSTOMY AND GASTROJEJUNOSTOMY (WHIPPLE-TYPE PROCEDURE); WITHOUT PANCREATOJEJUNOSTOMY  09/06/2017  . PORTA CATH INSERTION N/A 04/18/2017   Procedure: PORTA CATH INSERTION;  Surgeon: Algernon Huxley, MD;  Location: [place] CV LAB;  Service: Cardiovascular;  Laterality: N/A;    FAMILY HISTORY :   Family History  Problem Relation Age of Onset  . Pancreatic cancer Mother 12       deceased 19  . Diabetes Father   . Colon cancer Maternal Grandmother 23       deceased  71    SOCIAL HISTORY:   Social History   Tobacco Use  . Smoking status: Former  Smoker    Packs/day: 0.25    Years: 5.00    Pack years: 1.25    Types: Cigarettes    Last attempt to quit: 05/07/2015    Years since quitting: 2.4  . Smokeless tobacco: Never Used  Substance Use Topics  . Alcohol use: No  . Drug use: No    ALLERGIES:  has No Known Allergies.  MEDICATIONS:  Current Outpatient Medications  Medication Sig Dispense Refill  . acetaminophen (TYLENOL) 325 MG tablet Take 2 tablets (650 mg total) by mouth every 6 (six) hours as needed for mild pain or fever (headache). 20 tablet 0  . albuterol (PROVENTIL HFA;VENTOLIN HFA) 108 (90 Base) MCG/ACT inhaler Inhale 1-2 puffs every 6 (six) hours as needed into the lungs for wheezing or shortness of breath.    . Ascorbic Acid (VITA-C PO) Take 1,000 mg by mouth daily.    . ondansetron (ZOFRAN) 8 MG tablet One pill every 8 hours as needed for nausea/vomitting. 30 tablet 0  . pantoprazole (PROTONIX) 40 MG tablet Take 1 tablet by mouth daily.    Marland Kitchen amoxicillin-clarithromycin-lansoprazole (PREVPAC) combo pack Take by mouth 2 (two) times daily. Follow package directions. 1 kit 0  . traMADol (ULTRAM) 50 MG tablet Take 1 tablet (50 mg total) by mouth every 8 (eight) hours as needed. 30 tablet 0   No current facility-administered medications for this visit.    Facility-Administered Medications Ordered in Other Visits  Medication Dose Route Frequency Provider Last Rate Last Dose  . sodium chloride flush (NS) 0.9 % injection 10 mL  10 mL Intravenous PRN Cammie Sickle, MD   10 mL at 06/15/17 0941    PHYSICAL EXAMINATION: ECOG PERFORMANCE STATUS: 1 - Symptomatic but completely ambulatory  BP 126/80 (Patient Position: Sitting)   Pulse 60   Temp 97.9 F (36.6 C) (Tympanic)   Resp 20   Ht 5' 3"  (1.6 m)   Wt 137 lb (62.1 kg)   BMI 24.27 kg/m   Filed Weights   10/05/17 1108  Weight: 137 lb (62.1 kg)    GENERAL: Well-nourished well-developed; Alert, no distress and comfortable.  Alone.   EYES: no pallor or  icterus OROPHARYNX: no thrush or ulceration; NECK: supple; no lymph nodes felt. LYMPH:  no palpable lymphadenopathy in the axillary or inguinal regions LUNGS: Decreased breath sounds auscultation bilaterally. No wheeze or crackles HEART/CVS: regular rate & rhythm and no murmurs; No lower extremity edema ABDOMEN:abdomen soft, non-tender and normal bowel sounds. No hepatomegaly or splenomegaly.  Abdominal incision healing well. Musculoskeletal:no cyanosis of digits and no clubbing  PSYCH: alert & oriented x 3 with fluent speech NEURO: no focal motor/sensory deficits SKIN:  no rashes or significant lesions    LABORATORY DATA:  I have reviewed the data as listed    Component Value Date/Time   NA 139 10/05/2017 1045   K 3.8 10/05/2017 1045   CL 105 10/05/2017 1045   CO2 26 10/05/2017 1045   GLUCOSE 104 (H) 10/05/2017 1045   BUN 6 10/05/2017 1045   CREATININE 0.86 10/05/2017 1045   CALCIUM 9.2 10/05/2017 1045   PROT 7.3 10/05/2017 1045   ALBUMIN 3.8 10/05/2017 1045   AST 23 10/05/2017 1045   ALT 13 (L) 10/05/2017 1045   ALKPHOS 52 10/05/2017 1045   BILITOT 1.0 10/05/2017 1045   GFRNONAA >60 10/05/2017 1045   GFRAA >60 10/05/2017 1045  No results found for: SPEP, UPEP  Lab Results  Component Value Date   WBC 5.1 10/05/2017   NEUTROABS 1.6 10/05/2017   HGB 13.0 10/05/2017   HCT 37.9 (L) 10/05/2017   MCV 92.4 10/05/2017   PLT 250 10/05/2017      Chemistry      Component Value Date/Time   NA 139 10/05/2017 1045   K 3.8 10/05/2017 1045   CL 105 10/05/2017 1045   CO2 26 10/05/2017 1045   BUN 6 10/05/2017 1045   CREATININE 0.86 10/05/2017 1045      Component Value Date/Time   CALCIUM 9.2 10/05/2017 1045   ALKPHOS 52 10/05/2017 1045   AST 23 10/05/2017 1045   ALT 13 (L) 10/05/2017 1045   BILITOT 1.0 10/05/2017 1045       RADIOGRAPHIC STUDIES: I have personally reviewed the radiological images as listed and agreed with the findings in the report. No results  found.   ASSESSMENT & PLAN:  Malignant tumor of body of pancreas ([place]) #Pancreatic adenocarcinoma stage II; status post Whipple's [September 01, 2017].  Post Whipple-pathology shows pT2pN1; close but negative margins.  #Given the suboptimal response to neoadjuvant chemotherapy with FOLFIRINOX-recommend switching adjuvant therapy to gemcitabine Abraxane/gemcitabine Xeloda based on adjuvant therapy data.  Also discussed with Dr. Shon Hough from [facility].  #Discussed the schedule; and the potential side effects of each therapy.  Discussed with Ebony Hail from pharmacy regarding co-pay assistance for Xeloda.  # Pain control-better controlled;stable; continue tramdaol    # H.Pylori-IHC positive on stomach biopsy; recommend Prevpac. Will send to pharmacy.   # follow gem-abraxane in 1 week/ labs/MD/Gem in 2 weeks.   # 40 minutes face-to-face with the patient discussing the above plan of care; more than 50% of time spent on prognosis/ natural history; counseling and coordination.    Orders Placed This Encounter  Procedures  . CBC with Differential    Standing Status:   Future    Standing Expiration Date:   10/06/2018  . Basic metabolic panel    Standing Status:   Future    Standing Expiration Date:   10/06/2018   All questions were answered. The patient knows to call the clinic with any problems, questions or concerns.      Cammie Sickle, MD 10/05/2017 4:57 PM

## 2017-10-07 ENCOUNTER — Inpatient Hospital Stay: Payer: Medicaid Other

## 2017-10-11 ENCOUNTER — Other Ambulatory Visit: Payer: Self-pay | Admitting: Internal Medicine

## 2017-10-11 ENCOUNTER — Telehealth: Payer: Self-pay | Admitting: *Deleted

## 2017-10-11 MED ORDER — CAPECITABINE 150 MG PO TABS: 300.0000 mg | ORAL_TABLET | Freq: Two times a day (BID) | ORAL | 5 refills | Status: DC

## 2017-10-11 MED ORDER — CAPECITABINE 500 MG PO TABS: 1000.0000 mg | ORAL_TABLET | Freq: Two times a day (BID) | ORAL | 5 refills | Status: DC

## 2017-10-11 NOTE — Telephone Encounter (Signed)
cnl chemotherapy apt for tomorrow per dr. Jacinto Reap. RN left vm for patient that chemo is being cnl- as we are trying to start patient on xeloda. I asked patient to return my phone call to ensure that he recvd this msg.

## 2017-10-11 NOTE — Progress Notes (Signed)
Prescribed/printed xeloda script. Dr.B

## 2017-10-11 NOTE — Progress Notes (Signed)
DISCONTINUE ON PATHWAY REGIMEN - Pancreatic     A cycle is every 14 days:     Oxaliplatin      Leucovorin      Irinotecan      5-Fluorouracil      5-Fluorouracil   **Always confirm dose/schedule in your pharmacy ordering system**    REASON: Continuation Of Treatment PRIOR TREATMENT: PANOS62: FOLFIRINOX q14 Days x 4 Cycles TREATMENT RESPONSE: Progressive Disease (PD)  START ON PATHWAY REGIMEN - Pancreatic     A cycle is every 28 days:     Gemcitabine      Capecitabine   **Always confirm dose/schedule in your pharmacy ordering system**    Patient Characteristics: Adenocarcinoma, Borderline Resectable or High Risk, Potentially Resectable, Adjuvant Therapy, R0 Resection Histology: Adenocarcinoma Current evidence of distant metastases<= No AJCC T Category: T3 AJCC N Category: N0 AJCC M Category: M0 AJCC 8 Stage Grouping: IIA Intent of Therapy: Curative Intent, Discussed with Patient

## 2017-10-12 ENCOUNTER — Inpatient Hospital Stay: Payer: Medicaid Other

## 2017-10-12 ENCOUNTER — Telehealth: Payer: Self-pay | Admitting: Pharmacist

## 2017-10-12 DIAGNOSIS — C251 Malignant neoplasm of body of pancreas: Secondary | ICD-10-CM

## 2017-10-12 MED ORDER — CAPECITABINE 500 MG PO TABS: 1000.0000 mg | ORAL_TABLET | Freq: Two times a day (BID) | ORAL | 5 refills | Status: DC

## 2017-10-12 MED ORDER — CAPECITABINE 150 MG PO TABS: 300.0000 mg | ORAL_TABLET | Freq: Two times a day (BID) | ORAL | 5 refills | Status: DC

## 2017-10-12 NOTE — Progress Notes (Signed)
Colette, please cnl infusion encounter for next week as patient will not be receiving IV chemotherapy.

## 2017-10-12 NOTE — Telephone Encounter (Signed)
Oral Oncology Pharmacist Encounter  Received new prescription for Xeloda (capecitabine) for the treatment of stage II pancreatic cancer in conjunction with gemcitabine, planned duration until disease progression or unacceptable drug toxicity.  CMP from 10/05/17 assessed, no relevant lab abnormalities. Prescription dose and frequency assessed. (ESPAC-4 trial, gemcitabine and capecitabine)  Current medication list in Epic reviewed, one relevant DDIs with Xeloda identified: - PPIs can may diminish the therapeutic effect of capecitabine. There is a PPI contained in the medication regimen being used to treat Mr. Criswell H.Pylori-IHC positive on stomach biopsy. Additionally there is a PPI (pantoprazole) listed in his medication list, recommend stopping his pantoprazole if Mr. Wingerter is willing and able.   Prescription has been e-scribed to the [facility] for benefits analysis and approval.  Oral Oncology Clinic will continue to follow for insurance authorization, copayment issues, initial counseling and start date.  Darl Pikes, PharmD, BCPS Hematology/Oncology Clinical Pharmacist [facility] [phone]  10/12/2017 10:27 AM

## 2017-10-12 NOTE — Telephone Encounter (Signed)
Oral Chemotherapy Pharmacist Encounter   Attempted to reach patient to set-up Xeloda shipment and provide education. Unable to reach patient, unable to LVM.  Darl Pikes, PharmD, BCPS Hematology/Oncology Clinical Pharmacist [facility] [phone]  10/12/2017 1:19 PM

## 2017-10-13 ENCOUNTER — Other Ambulatory Visit: Payer: Self-pay | Admitting: Internal Medicine

## 2017-10-13 MED FILL — CAPECITABINE 150 MG TABS: 150 | 28 days supply | Qty: 84 | Fill #0

## 2017-10-13 MED FILL — CAPECITABINE 500 MG TABLET: 500 | 28 days supply | Qty: 84 | Fill #0

## 2017-10-13 NOTE — Telephone Encounter (Signed)
Oral Chemotherapy Pharmacist Encounter   Spoke with Nathaniel Saunders. Reviewed Xeloda administration, dosing, and side effects. Will provide face to face education when he is in the office for his appt next week, prior to him starting his Xeloda.  With the up coming holiday Xeloda will be delivered by Tuesday 5/28.   Darl Pikes, PharmD, BCPS Hematology/Oncology Clinical Pharmacist [facility] [phone]  10/13/2017 9:51 AM

## 2017-10-19 ENCOUNTER — Ambulatory Visit: Payer: Medicaid Other

## 2017-10-19 ENCOUNTER — Inpatient Hospital Stay: Payer: Medicaid Other

## 2017-10-19 ENCOUNTER — Other Ambulatory Visit: Payer: Medicaid Other

## 2017-10-19 ENCOUNTER — Ambulatory Visit: Payer: Medicaid Other | Admitting: Internal Medicine

## 2017-10-19 ENCOUNTER — Inpatient Hospital Stay: Payer: Medicaid Other | Admitting: Internal Medicine

## 2017-10-24 ENCOUNTER — Inpatient Hospital Stay: Payer: Medicaid Other | Attending: Internal Medicine

## 2017-10-24 ENCOUNTER — Inpatient Hospital Stay: Payer: Medicaid Other

## 2017-10-24 ENCOUNTER — Other Ambulatory Visit: Payer: Self-pay

## 2017-10-24 ENCOUNTER — Inpatient Hospital Stay (HOSPITAL_BASED_OUTPATIENT_CLINIC_OR_DEPARTMENT_OTHER): Payer: Medicaid Other | Admitting: Internal Medicine

## 2017-10-24 ENCOUNTER — Encounter: Payer: Self-pay | Admitting: Internal Medicine

## 2017-10-24 VITALS — BP 113/77 | HR 71 | Temp 97.6°F | Resp 12 | Ht 63.0 in | Wt 141.6 lb

## 2017-10-24 DIAGNOSIS — Z800 Family history of malignant neoplasm of digestive organs: Secondary | ICD-10-CM

## 2017-10-24 DIAGNOSIS — J45909 Unspecified asthma, uncomplicated: Secondary | ICD-10-CM | POA: Diagnosis not present

## 2017-10-24 DIAGNOSIS — K8681 Exocrine pancreatic insufficiency: Secondary | ICD-10-CM | POA: Diagnosis not present

## 2017-10-24 DIAGNOSIS — Z87891 Personal history of nicotine dependence: Secondary | ICD-10-CM | POA: Insufficient documentation

## 2017-10-24 DIAGNOSIS — C251 Malignant neoplasm of body of pancreas: Secondary | ICD-10-CM

## 2017-10-24 DIAGNOSIS — Z5111 Encounter for antineoplastic chemotherapy: Secondary | ICD-10-CM | POA: Insufficient documentation

## 2017-10-24 DIAGNOSIS — J8410 Pulmonary fibrosis, unspecified: Secondary | ICD-10-CM

## 2017-10-24 DIAGNOSIS — G893 Neoplasm related pain (acute) (chronic): Secondary | ICD-10-CM | POA: Diagnosis not present

## 2017-10-24 DIAGNOSIS — Z79899 Other long term (current) drug therapy: Secondary | ICD-10-CM | POA: Insufficient documentation

## 2017-10-24 DIAGNOSIS — R197 Diarrhea, unspecified: Secondary | ICD-10-CM | POA: Insufficient documentation

## 2017-10-24 LAB — CBC WITH DIFFERENTIAL/PLATELET
Basophils Absolute: 0 10*3/uL (ref 0–0.1)
Basophils Relative: 1 %
Eosinophils Absolute: 0.1 10*3/uL (ref 0–0.7)
Eosinophils Relative: 3 %
HEMATOCRIT: 36.5 % — AB (ref 40.0–52.0)
HEMOGLOBIN: 12.6 g/dL — AB (ref 13.0–18.0)
LYMPHS ABS: 2.2 10*3/uL (ref 1.0–3.6)
LYMPHS PCT: 56 %
MCH: 31.8 pg (ref 26.0–34.0)
MCHC: 34.6 g/dL (ref 32.0–36.0)
MCV: 91.9 fL (ref 80.0–100.0)
MONOS PCT: 9 %
Monocytes Absolute: 0.4 10*3/uL (ref 0.2–1.0)
NEUTROS PCT: 31 %
Neutro Abs: 1.3 10*3/uL — ABNORMAL LOW (ref 1.4–6.5)
Platelets: 220 10*3/uL (ref 150–440)
RBC: 3.98 MIL/uL — AB (ref 4.40–5.90)
RDW: 13.2 % (ref 11.5–14.5)
WBC: 4.1 10*3/uL (ref 3.8–10.6)

## 2017-10-24 LAB — BASIC METABOLIC PANEL
Anion gap: 5 (ref 5–15)
CHLORIDE: 106 mmol/L (ref 101–111)
CO2: 26 mmol/L (ref 22–32)
CREATININE: 0.89 mg/dL (ref 0.61–1.24)
Calcium: 8.8 mg/dL — ABNORMAL LOW (ref 8.9–10.3)
GFR calc non Af Amer: >60 mL/min (ref >60)
Glucose, Bld: 98 mg/dL (ref 65–99)
POTASSIUM: 3.7 mmol/L (ref 3.5–5.1)
SODIUM: 137 mmol/L (ref 135–145)

## 2017-10-24 MED ORDER — SODIUM CHLORIDE 0.9% FLUSH
10.0000 mL | Freq: Once | INTRAVENOUS | Status: AC
  Administered 2017-10-24: 10 mL via INTRAVENOUS
  Filled 2017-10-24: qty 10

## 2017-10-24 MED ORDER — PROCHLORPERAZINE MALEATE 10 MG PO TABS
10.0000 mg | ORAL_TABLET | Freq: Once | ORAL | Status: AC
  Administered 2017-10-24: 10 mg via ORAL
  Filled 2017-10-24: qty 1

## 2017-10-24 MED ORDER — SODIUM CHLORIDE 0.9 % IV SOLN
1600.0000 mg | Freq: Once | INTRAVENOUS | Status: AC
  Administered 2017-10-24: 1600 mg via INTRAVENOUS
  Filled 2017-10-24: qty 26.3

## 2017-10-24 MED ORDER — SODIUM CHLORIDE 0.9 % IV SOLN
Freq: Once | INTRAVENOUS | Status: AC
  Administered 2017-10-24: 11:00:00 via INTRAVENOUS
  Filled 2017-10-24: qty 1000

## 2017-10-24 MED ORDER — HEPARIN SOD (PORK) LOCK FLUSH 100 UNIT/ML IV SOLN
500.0000 [IU] | Freq: Once | INTRAVENOUS | Status: AC
  Administered 2017-10-24: 500 [IU] via INTRAVENOUS
  Filled 2017-10-24: qty 5

## 2017-10-24 NOTE — Progress Notes (Signed)
[facility] OFFICE PROGRESS NOTE  Patient Care Team: Maeola Sarah, [name], 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; [name]; [name]; [name]; [name]; [name];   # Stomach- H.Pylori [[facility]] S/p prevpack [may 2019]   ---------------------------------------------------    # Nov 2018-biliary obstruction status post ERCP; [name]; [name]; [name]; [name], [name], [name], and PALB2 c.1655A>G (p.Gln552Arg. This is still considered a normal result.  # Molecular testing Not done -------------------------------------------------   DIAGNOSIS: [ ]  Pancreatic cancer  STAGE:  II       ;GOALS: Curative  CURRENT/MOST RECENT THERAPY- Gem-Cap [June 3rd]          Malignant tumor of body of pancreas ([place])   10/11/2017 -  Chemotherapy    The patient had gemcitabine (GEMZAR) 1,600 mg in sodium chloride 0.9 % 250 mL chemo infusion, 1,672 mg, Intravenous,  Once, 1 of 4 cycles Administration: 1,600 mg (10/24/2017)  for chemotherapy treatment.          INTERVAL HISTORY:  Nathaniel Saunders 47 y.o.  male pleasant patient above history of pancreatic cancer status post Whipple's is here to proceed with adjuvant chemotherapy with gemcitabine Xeloda.  Patient complains of intermittent abdominal pain at the site of incision.   Otherwise denies any diarrhea.  Denies any nausea vomiting.  He is not currently taking any pain pills on a regular basis.  Review of Systems  Constitutional: Negative for chills, diaphoresis, fever, malaise/fatigue and weight loss.  HENT: Negative for nosebleeds and sore throat.   Eyes: Negative for double vision.  Respiratory: Negative for cough, hemoptysis, sputum production, shortness of breath and wheezing.   Cardiovascular: Negative for chest pain, palpitations, orthopnea and leg swelling.  Gastrointestinal: Positive for abdominal pain. Negative for blood in stool, constipation, diarrhea, heartburn, melena, nausea and vomiting.  Genitourinary: Negative for dysuria, frequency and urgency.  Musculoskeletal: Negative for back pain and joint pain.  Skin: Negative.  Negative for itching and rash.  Neurological: Negative for dizziness, tingling, focal weakness, weakness and headaches.  Endo/Heme/Allergies: Does not bruise/bleed easily.  Psychiatric/Behavioral: Negative for depression. The patient is not nervous/anxious and does not have insomnia.       PAST MEDICAL HISTORY :  Past Medical History:  Diagnosis Date  . Asthma   . Genetic testing 05/20/2017   Multi-Cancer panel (83 genes) @ [facility] - No pathogenic mutations detected  . Pancreatic cancer ([place])   . Pancreatic mass     PAST SURGICAL HISTORY :   Past Surgical History:  Procedure Laterality Date  . ERCP N/A 04/21/2017   Procedure: ENDOSCOPIC RETROGRADE CHOLANGIOPANCREATOGRAPHY (ERCP);  Surgeon: Lucilla Lame, MD;  Location: [facility] ENDOSCOPY;  Service: Endoscopy;  Laterality: N/A;  . ERCP N/A 05/06/2017   Procedure: ENDOSCOPIC RETROGRADE CHOLANGIOPANCREATOGRAPHY (ERCP);  Surgeon: Lucilla Lame, MD;  Location: [facility] ENDOSCOPY;  Service: Endoscopy;  Laterality: N/A;  . PANCREATECTOMY, PROXIMAL SUBTOTAL WITH TOTAL DUODENECTOMY, PARTIAL GASTRECTOMY, CHOLEDOCHOENTEROSTOMY AND GASTROJEJUNOSTOMY (WHIPPLE-TYPE PROCEDURE); WITHOUT  PANCREATOJEJUNOSTOMY  09/06/2017  . PORTA CATH INSERTION N/A 04/18/2017   Procedure: PORTA CATH INSERTION;  Surgeon: Algernon Huxley, MD;  Location: [place] CV LAB;  Service: Cardiovascular;  Laterality: N/A;    FAMILY HISTORY :   Family History  Problem Relation Age of Onset  . Pancreatic cancer Mother 54       deceased 47  . Diabetes Father   . Colon cancer Maternal Grandmother 70       deceased 19    SOCIAL HISTORY:   Social History   Tobacco Use  . Smoking status: Former Smoker    Packs/day: 0.25    Years: 5.00    Pack years: 1.25    Types: Cigarettes    Last attempt to quit: 05/07/2015    Years since quitting: 2.4  . Smokeless tobacco: Never Used  Substance Use Topics  . Alcohol use: No  . Drug use: No    ALLERGIES:  has No Known Allergies.  MEDICATIONS:  Current Outpatient Medications  Medication Sig Dispense Refill  . acetaminophen (TYLENOL) 325 MG tablet Take 2 tablets (650 mg total) by mouth every 6 (six) hours as needed for mild pain or fever (headache). 20 tablet 0  . albuterol (PROVENTIL HFA;VENTOLIN HFA) 108 (90 Base) MCG/ACT inhaler Inhale 1-2 puffs every 6 (six) hours as needed into the lungs for wheezing or shortness of breath.    Marland Kitchen amoxicillin-clarithromycin-lansoprazole (PREVPAC) combo pack Take by mouth 2 (two) times daily. Follow package directions. 1 kit 0  . Ascorbic Acid (VITA-C PO) Take 1,000 mg by mouth daily.    . capecitabine (XELODA) 150 MG tablet Take 2 tablets (300 mg total) by mouth 2 (two) times daily after a meal. Take with two 523m tablets. Take for 3 weeks on, 1 week off. 84 tablet 5  . capecitabine (XELODA) 500 MG tablet Take 2 tablets (1,000 mg total) by mouth 2 (two) times daily after a meal. Take with two 1556mtablets. Take for 3 weeks on, 1 week off. 84 tablet 5  . ondansetron (ZOFRAN) 8 MG tablet One pill every 8 hours as needed for nausea/vomitting. 30 tablet 0  . pantoprazole (PROTONIX) 40 MG tablet Take 1 tablet by mouth  daily.    . traMADol (ULTRAM) 50 MG tablet Take 1 tablet (50 mg total) by mouth every 8 (eight) hours as needed. 30 tablet 0   No current facility-administered medications for this visit.    Facility-Administered Medications Ordered in Other Visits  Medication Dose Route Frequency Provider Last Rate Last Dose  . sodium chloride flush (NS) 0.9 % injection 10 mL  10 mL Intravenous PRN BrCammie SickleMD   10 mL at 06/15/17 0941    PHYSICAL EXAMINATION: ECOG PERFORMANCE STATUS: 0 - Asymptomatic  BP 113/77 (BP Location: Left Arm, Patient Position: Sitting)   Pulse 71   Temp 97.6 F (36.4 C)   Resp 12   Ht 5' 3"  (1.6 m)   Wt 141 lb 9.6 oz (64.2 kg)   BMI 25.08 kg/m   Filed Weights   10/24/17 0943  Weight: 141 lb 9.6 oz (64.2 kg)    GENERAL: Well-nourished well-developed; Alert, no distress and comfortable.  Alone.  EYES: no pallor or icterus OROPHARYNX: no thrush or ulceration; NECK: supple; no lymph nodes felt. LYMPH:  no palpable lymphadenopathy in the axillary or inguinal regions LUNGS: Decreased breath sounds auscultation bilaterally. No wheeze or crackles HEART/CVS: regular rate & rhythm and no murmurs; No lower extremity edema ABDOMEN:abdomen  soft, non-tender and normal bowel sounds. No hepatomegaly or splenomegaly.  Abdominal incision well healing. Musculoskeletal:no cyanosis of digits and no clubbing  PSYCH: alert & oriented x 3 with fluent speech NEURO: no focal motor/sensory deficits SKIN:  no rashes or significant lesions    LABORATORY DATA:  I have reviewed the data as listed    Component Value Date/Time   NA 137 10/24/2017 0934   K 3.7 10/24/2017 0934   CL 106 10/24/2017 0934   CO2 26 10/24/2017 0934   GLUCOSE 98 10/24/2017 0934   BUN <5 (L) 10/24/2017 0934   CREATININE 0.89 10/24/2017 0934   CALCIUM 8.8 (L) 10/24/2017 0934   PROT 7.3 10/05/2017 1045   ALBUMIN 3.8 10/05/2017 1045   AST 23 10/05/2017 1045   ALT 13 (L) 10/05/2017 1045   ALKPHOS  52 10/05/2017 1045   BILITOT 1.0 10/05/2017 1045   GFRNONAA >60 10/24/2017 0934   GFRAA >60 10/24/2017 0934    No results found for: SPEP, UPEP  Lab Results  Component Value Date   WBC 4.1 10/24/2017   NEUTROABS 1.3 (L) 10/24/2017   HGB 12.6 (L) 10/24/2017   HCT 36.5 (L) 10/24/2017   MCV 91.9 10/24/2017   PLT 220 10/24/2017      Chemistry      Component Value Date/Time   NA 137 10/24/2017 0934   K 3.7 10/24/2017 0934   CL 106 10/24/2017 0934   CO2 26 10/24/2017 0934   BUN <5 (L) 10/24/2017 0934   CREATININE 0.89 10/24/2017 0934      Component Value Date/Time   CALCIUM 8.8 (L) 10/24/2017 0934   ALKPHOS 52 10/05/2017 1045   AST 23 10/05/2017 1045   ALT 13 (L) 10/05/2017 1045   BILITOT 1.0 10/05/2017 1045       RADIOGRAPHIC STUDIES: I have personally reviewed the radiological images as listed and agreed with the findings in the report. No results found.   ASSESSMENT & PLAN:  Malignant tumor of body of pancreas ([place]) #Pancreatic adenocarcinoma stage II s/p Whipple's.  #Proceed with adjuvant chemotherapy with gemcitabine-Xeloda starting today.  Gemcitabine weekly x3; Xeloda 3 weeks on 1 week off; every 28-day cycle.  Again reviewed the schedule with the patient in detail.  # Pain control-better controlled;stable; continue tramdaol    Chemo today; lab-cbc-gem in 1 week; X-MD/labs-gem; follow up with me in 4 weeks/labs-Gem.    Orders Placed This Encounter  Procedures  . CBC with Differential    Standing Status:   Future    Standing Expiration Date:   10/24/2018  . Basic metabolic panel    Standing Status:   Future    Standing Expiration Date:   10/24/2018  . CBC with Differential    Standing Status:   Future    Standing Expiration Date:   10/24/2018  . Comprehensive metabolic panel    Standing Status:   Future    Standing Expiration Date:   10/24/2018  . CBC with Differential    Standing Status:   Future    Standing Expiration Date:   10/24/2018  . Comprehensive  metabolic panel    Standing Status:   Future    Standing Expiration Date:   10/24/2018   All questions were answered. The patient knows to call the clinic with any problems, questions or concerns.      Cammie Sickle, MD 10/24/2017 11:19 PM

## 2017-10-24 NOTE — Progress Notes (Signed)
Patient here for follow up no changes since his last appt.

## 2017-10-24 NOTE — Assessment & Plan Note (Addendum)
#  Pancreatic adenocarcinoma stage II s/p Whipple's.  #Proceed with adjuvant chemotherapy with gemcitabine-Xeloda starting today.  Gemcitabine weekly x3; Xeloda 3 weeks on 1 week off; every 28-day cycle.  Again reviewed the schedule with the patient in detail.  # Pain control-better controlled;stable; continue tramdaol    Chemo today; lab-cbc-gem in 1 week; X-MD/labs-gem; follow up with me in 4 weeks/labs-Gem.

## 2017-10-24 NOTE — Telephone Encounter (Signed)
Oral Chemotherapy Pharmacist Encounter  Patient Education I spoke with patient in infusion following his OV. Provided overview of new oral chemotherapy medication: Xeloda (capecitabine) for the treatment of stage II pancreatic cancer in conjunction with gemcitabine, planned duration until disease progression or unacceptable drug toxicity.   Counseled patient on administration, dosing, side effects, monitoring, drug-food interactions, safe handling, storage, and disposal. Patient will take two 500mg  tablets by mouth 2 (two) times daily after a meal. Take with two 150mg  tablets. Take for 3 weeks on, 1 week off.  Side effects include but not limited to: hand-foot syndrome, N/V, decrease wbc/plt/hgb, diarrhea.  Reviewed with patient importance of keeping a medication schedule and plan for any missed doses.  Nathaniel Saunders voiced understanding and appreciation. All questions answered. Medication handout provided.  Provided patient with [facility] phone number. Patient knows to call the office with questions or concerns. Oral Chemotherapy [facility] will continue to follow.  Darl Pikes, PharmD, BCPS Hematology/Oncology Clinical Pharmacist [facility] [phone]  10/24/2017 2:38 PM

## 2017-10-31 ENCOUNTER — Inpatient Hospital Stay: Payer: Medicaid Other

## 2017-10-31 ENCOUNTER — Telehealth: Payer: Self-pay | Admitting: Internal Medicine

## 2017-10-31 NOTE — Telephone Encounter (Signed)
Pt called is out of town and stated he will CB this week when he gets back in town to come do lab treatment.

## 2017-11-07 ENCOUNTER — Inpatient Hospital Stay: Payer: Medicaid Other

## 2017-11-07 ENCOUNTER — Inpatient Hospital Stay (HOSPITAL_BASED_OUTPATIENT_CLINIC_OR_DEPARTMENT_OTHER): Payer: Medicaid Other | Admitting: Oncology

## 2017-11-07 ENCOUNTER — Other Ambulatory Visit: Payer: Self-pay

## 2017-11-07 ENCOUNTER — Encounter: Payer: Self-pay | Admitting: Oncology

## 2017-11-07 VITALS — BP 117/70 | HR 66 | Temp 95.9°F | Resp 18 | Wt 135.0 lb

## 2017-11-07 DIAGNOSIS — R197 Diarrhea, unspecified: Secondary | ICD-10-CM

## 2017-11-07 DIAGNOSIS — Z79899 Other long term (current) drug therapy: Secondary | ICD-10-CM

## 2017-11-07 DIAGNOSIS — K8681 Exocrine pancreatic insufficiency: Secondary | ICD-10-CM

## 2017-11-07 DIAGNOSIS — Z5111 Encounter for antineoplastic chemotherapy: Secondary | ICD-10-CM

## 2017-11-07 DIAGNOSIS — Z800 Family history of malignant neoplasm of digestive organs: Secondary | ICD-10-CM

## 2017-11-07 DIAGNOSIS — J45909 Unspecified asthma, uncomplicated: Secondary | ICD-10-CM | POA: Diagnosis not present

## 2017-11-07 DIAGNOSIS — J8410 Pulmonary fibrosis, unspecified: Secondary | ICD-10-CM | POA: Diagnosis not present

## 2017-11-07 DIAGNOSIS — C251 Malignant neoplasm of body of pancreas: Secondary | ICD-10-CM

## 2017-11-07 DIAGNOSIS — Z87891 Personal history of nicotine dependence: Secondary | ICD-10-CM

## 2017-11-07 DIAGNOSIS — G893 Neoplasm related pain (acute) (chronic): Secondary | ICD-10-CM | POA: Diagnosis not present

## 2017-11-07 LAB — CBC WITH DIFFERENTIAL/PLATELET
BASOS PCT: 0 %
Basophils Absolute: 0 10*3/uL (ref 0–0.1)
EOS ABS: 0.1 10*3/uL (ref 0–0.7)
Eosinophils Relative: 3 %
HEMATOCRIT: 35.6 % — AB (ref 40.0–52.0)
HEMOGLOBIN: 12.3 g/dL — AB (ref 13.0–18.0)
LYMPHS ABS: 2.2 10*3/uL (ref 1.0–3.6)
Lymphocytes Relative: 52 %
MCH: 31.6 pg (ref 26.0–34.0)
MCHC: 34.6 g/dL (ref 32.0–36.0)
MCV: 91.2 fL (ref 80.0–100.0)
MONOS PCT: 8 %
Monocytes Absolute: 0.3 10*3/uL (ref 0.2–1.0)
NEUTROS PCT: 37 %
Neutro Abs: 1.6 10*3/uL (ref 1.4–6.5)
Platelets: 261 10*3/uL (ref 150–440)
RBC: 3.91 MIL/uL — AB (ref 4.40–5.90)
RDW: 12.8 % (ref 11.5–14.5)
WBC: 4.3 10*3/uL (ref 3.8–10.6)

## 2017-11-07 LAB — COMPREHENSIVE METABOLIC PANEL
ALT: 15 U/L — ABNORMAL LOW (ref 17–63)
AST: 18 U/L (ref 15–41)
Albumin: 3.7 g/dL (ref 3.5–5.0)
Alkaline Phosphatase: 54 U/L (ref 38–126)
Anion gap: 5 (ref 5–15)
BUN: 8 mg/dL (ref 6–20)
CHLORIDE: 109 mmol/L (ref 101–111)
CO2: 26 mmol/L (ref 22–32)
Calcium: 9 mg/dL (ref 8.9–10.3)
Creatinine, Ser: 0.78 mg/dL (ref 0.61–1.24)
Glucose, Bld: 100 mg/dL — ABNORMAL HIGH (ref 65–99)
POTASSIUM: 3.8 mmol/L (ref 3.5–5.1)
SODIUM: 140 mmol/L (ref 135–145)
Total Bilirubin: 0.6 mg/dL (ref 0.3–1.2)
Total Protein: 6.9 g/dL (ref 6.5–8.1)

## 2017-11-07 MED ORDER — GEMCITABINE HCL CHEMO INJECTION 1 GM/26.3ML
1600.0000 mg | Freq: Once | INTRAVENOUS | Status: AC
  Administered 2017-11-07: 1600 mg via INTRAVENOUS
  Filled 2017-11-07: qty 26.3

## 2017-11-07 MED ORDER — HEPARIN SOD (PORK) LOCK FLUSH 100 UNIT/ML IV SOLN
500.0000 [IU] | Freq: Once | INTRAVENOUS | Status: AC
  Administered 2017-11-07: 500 [IU] via INTRAVENOUS
  Filled 2017-11-07: qty 5

## 2017-11-07 MED ORDER — SODIUM CHLORIDE 0.9% FLUSH
10.0000 mL | INTRAVENOUS | Status: DC | PRN
  Administered 2017-11-07: 10 mL via INTRAVENOUS
  Filled 2017-11-07: qty 10

## 2017-11-07 MED ORDER — SODIUM CHLORIDE 0.9 % IV SOLN
Freq: Once | INTRAVENOUS | Status: AC
  Administered 2017-11-07: 11:00:00 via INTRAVENOUS
  Filled 2017-11-07: qty 1000

## 2017-11-07 MED ORDER — PROCHLORPERAZINE MALEATE 10 MG PO TABS
10.0000 mg | ORAL_TABLET | Freq: Once | ORAL | Status: AC
Start: 2017-11-07 — End: 2017-11-07
  Administered 2017-11-07: 10 mg via ORAL
  Filled 2017-11-07: qty 1

## 2017-11-07 NOTE — Progress Notes (Signed)
[place] OFFICE PROGRESS NOTE  Patient Care Team: Maeola Sarah, [name], 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; [name]; [name]; [name]; [name]; [name];   # Stomach- H.Pylori [[facility]] S/p prevpack [may 2019]   ---------------------------------------------------    # Nov 2018-biliary obstruction status post ERCP; [name]; [name]; [name]; [name], [name], [name], and PALB2 c.1655A>G (p.Gln552Arg. This is still considered a normal result.  # Molecular testing Not done -------------------------------------------------   DIAGNOSIS: _0  Pancreatic cancer  STAGE:  II       ;GOALS: Curative  CURRENT/MOST RECENT THERAPY- Gem-Cap [June 3rd]          Malignant tumor of body of pancreas ([place])   10/11/2017 -  Chemotherapy    The patient had gemcitabine (GEMZAR) 1,600 mg in sodium chloride 0.9 % 250 mL chemo infusion, 1,672 mg, Intravenous,  Once, 1 of 4 cycles Administration: 1,600 mg (10/24/2017), 1,600 mg (11/07/2017)  for chemotherapy treatment.          INTERVAL HISTORY:  Janson Deon Waldvogel 47 y.o.  male pleasant patient above history of pancreatic cancer status post Whipple's procedure is here for assessment prior to adjuvant chemotherapy with gemcitabine.  #Chemotherapy: currently on gemcitabine weekly x3  every 28 days, Xeloda 3 weeks on 1 week  reports tolerating well.  Denies nausea vomiting #Diarrhea: Reports having 3-4 episodes of gel-like loose stool every day, started when he start Xeloda. .  Not associated with abdominal pain.   he takes Imodium.  Twice a day not taking any pain medication.   Review of Systems  Constitutional: Negative for chills, diaphoresis, fever, malaise/fatigue and weight loss.  HENT: Negative for nosebleeds and sore throat.   Eyes: Negative for double vision.  Respiratory: Negative for cough, hemoptysis, sputum production, shortness of breath and wheezing.   Cardiovascular: Negative for chest pain, palpitations, orthopnea and leg swelling.  Gastrointestinal: Positive for diarrhea. Negative for blood in stool, constipation, heartburn, melena, nausea and vomiting.  Genitourinary: Negative for dysuria, frequency and urgency.  Musculoskeletal: Negative for back pain and joint pain.  Skin: Negative.  Negative for itching and rash.  Neurological: Negative for dizziness, tingling, focal weakness, weakness and headaches.  Endo/Heme/Allergies: Does not bruise/bleed easily.  Psychiatric/Behavioral: Negative for depression. The patient is not nervous/anxious and does not have insomnia.       PAST MEDICAL HISTORY :  Past Medical History:  Diagnosis Date  . Asthma   . Genetic testing 05/20/2017   Multi-Cancer panel (83 genes) @ [facility] - No pathogenic mutations detected  . Pancreatic cancer ([place])   . Pancreatic mass     PAST SURGICAL HISTORY :   Past Surgical History:  Procedure Laterality Date  . ERCP N/A 04/21/2017   Procedure: ENDOSCOPIC RETROGRADE CHOLANGIOPANCREATOGRAPHY (ERCP);  Surgeon: Lucilla Lame, MD;  Location[facility] ENDOSCOPY;  Service: Endoscopy;  Laterality: N/A;  . ERCP N/A 05/06/2017   Procedure: ENDOSCOPIC RETROGRADE CHOLANGIOPANCREATOGRAPHY (ERCP);  Surgeon:  Lucilla Lame, MD;  Location: [facility] ENDOSCOPY;  Service: Endoscopy;  Laterality: N/A;  .  PANCREATECTOMY, PROXIMAL SUBTOTAL WITH TOTAL DUODENECTOMY, PARTIAL GASTRECTOMY, CHOLEDOCHOENTEROSTOMY AND GASTROJEJUNOSTOMY (WHIPPLE-TYPE PROCEDURE); WITHOUT PANCREATOJEJUNOSTOMY  09/06/2017  . PORTA CATH INSERTION N/A 04/18/2017   Procedure: PORTA CATH INSERTION;  Surgeon: Algernon Huxley, MD;  Location: [place] CV LAB;  Service: Cardiovascular;  Laterality: N/A;    FAMILY HISTORY :   Family History  Problem Relation Age of Onset  . Pancreatic cancer Mother 55       deceased 92  . Diabetes Father   . Colon cancer Maternal Grandmother 70       deceased 55    SOCIAL HISTORY:   Social History   Tobacco Use  . Smoking status: Former Smoker    Packs/day: 0.25    Years: 5.00    Pack years: 1.25    Types: Cigarettes    Last attempt to quit: 05/07/2015    Years since quitting: 2.5  . Smokeless tobacco: Never Used  Substance Use Topics  . Alcohol use: No  . Drug use: No    ALLERGIES:  has No Known Allergies.  MEDICATIONS:  Current Outpatient Medications  Medication Sig Dispense Refill  . albuterol (PROVENTIL HFA;VENTOLIN HFA) 108 (90 Base) MCG/ACT inhaler Inhale 1-2 puffs every 6 (six) hours as needed into the lungs for wheezing or shortness of breath.    . ondansetron (ZOFRAN) 8 MG tablet One pill every 8 hours as needed for nausea/vomitting. 30 tablet 0  . acetaminophen (TYLENOL) 325 MG tablet Take 2 tablets (650 mg total) by mouth every 6 (six) hours as needed for mild pain or fever (headache). (Patient not taking: Reported on 11/07/2017) 20 tablet 0  . amoxicillin-clarithromycin-lansoprazole (PREVPAC) combo pack Take by mouth 2 (two) times daily. Follow package directions. (Patient not taking: Reported on 11/07/2017) 1 kit 0  . Ascorbic Acid (VITA-C PO) Take 1,000 mg by mouth daily.    . capecitabine (XELODA) 150 MG tablet Take 2 tablets (300 mg total) by mouth 2 (two) times daily after a meal. Take with two 534m tablets. Take for 3 weeks on, 1 week off. (Patient not taking:  Reported on 11/07/2017) 84 tablet 5  . capecitabine (XELODA) 500 MG tablet Take 2 tablets (1,000 mg total) by mouth 2 (two) times daily after a meal. Take with two 1515mtablets. Take for 3 weeks on, 1 week off. (Patient not taking: Reported on 11/07/2017) 84 tablet 5  . pantoprazole (PROTONIX) 40 MG tablet Take 1 tablet by mouth daily.    . traMADol (ULTRAM) 50 MG tablet Take 1 tablet (50 mg total) by mouth every 8 (eight) hours as needed. (Patient not taking: Reported on 11/07/2017) 30 tablet 0   No current facility-administered medications for this visit.    Facility-Administered Medications Ordered in Other Visits  Medication Dose Route Frequency Provider Last Rate Last Dose  . sodium chloride flush (NS) 0.9 % injection 10 mL  10 mL Intravenous PRN BrCammie SickleMD   10 mL at 06/15/17 0941    PHYSICAL EXAMINATION: ECOG PERFORMANCE STATUS: 0 - Asymptomatic  BP 117/70   Pulse 66   Temp (!) 95.9 F (35.5 C) (Tympanic)   Resp 18   Wt 135 lb (61.2 kg)   BMI 23.91 kg/m   Filed Weights   11/07/17 1026  Weight: 135 lb (61.2 kg)    Physical Exam  Constitutional: He is oriented to person, place, and time and well-developed, well-nourished, and in  no distress. No distress.  HENT:  Head: Normocephalic and atraumatic.  Nose: Nose normal.  Mouth/Throat: Oropharynx is clear and moist. No oropharyngeal exudate.  Eyes: Pupils are equal, round, and reactive to light. EOM are normal. Left eye exhibits no discharge. No scleral icterus.  Neck: Normal range of motion. Neck supple. No JVD present.  Cardiovascular: Normal rate, regular rhythm and normal heart sounds.  No murmur heard. Pulmonary/Chest: Effort normal and breath sounds normal. No respiratory distress. He has no wheezes. He has no rales. He exhibits no tenderness.  Abdominal: Soft. He exhibits no distension and no mass. There is no tenderness. There is no rebound.  Musculoskeletal: Normal range of motion. He exhibits no edema  or tenderness.  Lymphadenopathy:    He has no cervical adenopathy.  Neurological: He is alert and oriented to person, place, and time. No cranial nerve deficit. He exhibits normal muscle tone. Coordination normal.  Skin: Skin is warm and dry. No rash noted. He is not diaphoretic. No erythema.  Psychiatric: Affect and judgment normal.     LABORATORY DATA:  I have reviewed the data as listed    Component Value Date/Time   NA 140 11/07/2017 1015   K 3.8 11/07/2017 1015   CL 109 11/07/2017 1015   CO2 26 11/07/2017 1015   GLUCOSE 100 (H) 11/07/2017 1015   BUN 8 11/07/2017 1015   CREATININE 0.78 11/07/2017 1015   CALCIUM 9.0 11/07/2017 1015   PROT 6.9 11/07/2017 1015   ALBUMIN 3.7 11/07/2017 1015   AST 18 11/07/2017 1015   ALT 15 (L) 11/07/2017 1015   ALKPHOS 54 11/07/2017 1015   BILITOT 0.6 11/07/2017 1015   GFRNONAA >60 11/07/2017 1015   GFRAA >60 11/07/2017 1015    No results found for: SPEP, UPEP  Lab Results  Component Value Date   WBC 4.3 11/07/2017   NEUTROABS 1.6 11/07/2017   HGB 12.3 (L) 11/07/2017   HCT 35.6 (L) 11/07/2017   MCV 91.2 11/07/2017   PLT 261 11/07/2017      Chemistry      Component Value Date/Time   NA 140 11/07/2017 1015   K 3.8 11/07/2017 1015   CL 109 11/07/2017 1015   CO2 26 11/07/2017 1015   BUN 8 11/07/2017 1015   CREATININE 0.78 11/07/2017 1015      Component Value Date/Time   CALCIUM 9.0 11/07/2017 1015   ALKPHOS 54 11/07/2017 1015   AST 18 11/07/2017 1015   ALT 15 (L) 11/07/2017 1015   BILITOT 0.6 11/07/2017 1015       RADIOGRAPHIC STUDIES: I have personally reviewed the radiological images as listed and agreed with the findings in the report. No results found.   ASSESSMENT & PLAN:  1. Malignant tumor of body of pancreas ([place])   2. Encounter for antineoplastic chemotherapy   3. Diarrhea, unspecified type    #Tolerate adjuvant chemotherapy with gemcitabine and Xeloda well. Labs are reviewed, okay to proceed gemcitabine  today.  He will finish his.  Next week he is off chemotherapy. Repeat CBC CMP  in 2 weeks, follow-up with Dr. B assessment prior to next cycle.  Diarrhea, likely secondary to Xeloda.  Discussed use of Imodium.  He voices understanding.  All questions were answered. The patient knows to call the clinic with any problems, questions or concerns.  Total face to face encounter time for this patient visit was 25 min. >50% of the time was  spent in counseling and coordination of care.  Earlie Server, MD 11/07/2017 5:08 PM

## 2017-11-07 NOTE — Progress Notes (Signed)
Patient here today for follow up. Complains of mild pain to abdomen and "orange gel" in stool that started appearing after surgery.

## 2017-11-14 ENCOUNTER — Telehealth: Payer: Self-pay | Admitting: Pharmacist

## 2017-11-14 NOTE — Telephone Encounter (Signed)
Oral Chemotherapy Pharmacist Encounter  Follow-Up Form  Called patient today to follow up regarding patient's oral chemotherapy medication: Xeloda (capecitabine)  Original Start date of oral chemotherapy: 10/24/17  Pt reports the following side effects: diarrhea when he first started but he has been able to manage that with loperamide  Reviewed medication administration with Nathaniel Saunders. He knows that this is his week off of medication and he is to restart on 11/21/17  Patient knows to call the office with questions or concerns. Oral Oncology Clinic will continue to follow.  Darl Pikes, PharmD, BCPS Hematology/Oncology Clinical Pharmacist [facility] [place] [facility] [phone]  11/14/2017 2:36 PM

## 2017-11-17 MED FILL — CAPECITABINE 150 MG TABS: 150 | 28 days supply | Qty: 84 | Fill #1

## 2017-11-17 MED FILL — CAPECITABINE 500 MG TABLET: 500 | 28 days supply | Qty: 84 | Fill #1

## 2017-11-21 ENCOUNTER — Inpatient Hospital Stay (HOSPITAL_BASED_OUTPATIENT_CLINIC_OR_DEPARTMENT_OTHER): Payer: Medicaid Other | Admitting: Internal Medicine

## 2017-11-21 ENCOUNTER — Encounter (INDEPENDENT_AMBULATORY_CARE_PROVIDER_SITE_OTHER): Payer: Self-pay

## 2017-11-21 ENCOUNTER — Inpatient Hospital Stay: Payer: Medicaid Other | Attending: Internal Medicine

## 2017-11-21 ENCOUNTER — Other Ambulatory Visit: Payer: Self-pay

## 2017-11-21 ENCOUNTER — Encounter: Payer: Self-pay | Admitting: Internal Medicine

## 2017-11-21 ENCOUNTER — Inpatient Hospital Stay: Payer: Medicaid Other

## 2017-11-21 VITALS — BP 124/80 | HR 62 | Temp 97.9°F | Resp 20 | Ht 63.0 in | Wt 139.0 lb

## 2017-11-21 DIAGNOSIS — Z87891 Personal history of nicotine dependence: Secondary | ICD-10-CM | POA: Insufficient documentation

## 2017-11-21 DIAGNOSIS — G893 Neoplasm related pain (acute) (chronic): Secondary | ICD-10-CM | POA: Diagnosis not present

## 2017-11-21 DIAGNOSIS — R918 Other nonspecific abnormal finding of lung field: Secondary | ICD-10-CM | POA: Diagnosis not present

## 2017-11-21 DIAGNOSIS — J45909 Unspecified asthma, uncomplicated: Secondary | ICD-10-CM | POA: Insufficient documentation

## 2017-11-21 DIAGNOSIS — K909 Intestinal malabsorption, unspecified: Secondary | ICD-10-CM | POA: Insufficient documentation

## 2017-11-21 DIAGNOSIS — C251 Malignant neoplasm of body of pancreas: Secondary | ICD-10-CM

## 2017-11-21 DIAGNOSIS — Z5111 Encounter for antineoplastic chemotherapy: Secondary | ICD-10-CM

## 2017-11-21 DIAGNOSIS — Z79899 Other long term (current) drug therapy: Secondary | ICD-10-CM | POA: Diagnosis not present

## 2017-11-21 DIAGNOSIS — Z800 Family history of malignant neoplasm of digestive organs: Secondary | ICD-10-CM | POA: Insufficient documentation

## 2017-11-21 LAB — COMPREHENSIVE METABOLIC PANEL
ALBUMIN: 4 g/dL (ref 3.5–5.0)
ALT: 17 U/L (ref 0–44)
ANION GAP: 11 (ref 5–15)
AST: 28 U/L (ref 15–41)
Alkaline Phosphatase: 61 U/L (ref 38–126)
BILIRUBIN TOTAL: 0.7 mg/dL (ref 0.3–1.2)
BUN: 6 mg/dL (ref 6–20)
CHLORIDE: 105 mmol/L (ref 98–111)
CO2: 23 mmol/L (ref 22–32)
Calcium: 9.1 mg/dL (ref 8.9–10.3)
Creatinine, Ser: 0.94 mg/dL (ref 0.61–1.24)
GFR calc Af Amer: >60 mL/min (ref >60)
GLUCOSE: 104 mg/dL — AB (ref 70–99)
POTASSIUM: 3.7 mmol/L (ref 3.5–5.1)
Sodium: 139 mmol/L (ref 135–145)
TOTAL PROTEIN: 7.2 g/dL (ref 6.5–8.1)

## 2017-11-21 LAB — CBC WITH DIFFERENTIAL/PLATELET
BASOS PCT: 1 %
Basophils Absolute: 0 10*3/uL (ref 0–0.1)
EOS ABS: 0.1 10*3/uL (ref 0–0.7)
Eosinophils Relative: 3 %
HCT: 37.1 % — ABNORMAL LOW (ref 40.0–52.0)
HEMOGLOBIN: 12.8 g/dL — AB (ref 13.0–18.0)
Lymphocytes Relative: 48 %
Lymphs Abs: 2.1 10*3/uL (ref 1.0–3.6)
MCH: 32.2 pg (ref 26.0–34.0)
MCHC: 34.4 g/dL (ref 32.0–36.0)
MCV: 93.7 fL (ref 80.0–100.0)
MONOS PCT: 12 %
Monocytes Absolute: 0.5 10*3/uL (ref 0.2–1.0)
NEUTROS PCT: 36 %
Neutro Abs: 1.6 10*3/uL (ref 1.4–6.5)
Platelets: 257 10*3/uL (ref 150–440)
RBC: 3.96 MIL/uL — ABNORMAL LOW (ref 4.40–5.90)
RDW: 14 % (ref 11.5–14.5)
WBC: 4.3 10*3/uL (ref 3.8–10.6)

## 2017-11-21 MED ORDER — PROCHLORPERAZINE MALEATE 10 MG PO TABS
10.0000 mg | ORAL_TABLET | Freq: Once | ORAL | Status: AC
  Administered 2017-11-21: 10 mg via ORAL
  Filled 2017-11-21: qty 1

## 2017-11-21 MED ORDER — SODIUM CHLORIDE 0.9% FLUSH
10.0000 mL | INTRAVENOUS | Status: DC | PRN
  Administered 2017-11-21: 10 mL via INTRAVENOUS
  Filled 2017-11-21: qty 10

## 2017-11-21 MED ORDER — SODIUM CHLORIDE 0.9 % IV SOLN
Freq: Once | INTRAVENOUS | Status: AC
  Administered 2017-11-21: 10:00:00 via INTRAVENOUS
  Filled 2017-11-21: qty 1000

## 2017-11-21 MED ORDER — HEPARIN SOD (PORK) LOCK FLUSH 100 UNIT/ML IV SOLN
500.0000 [IU] | Freq: Once | INTRAVENOUS | Status: AC
  Administered 2017-11-21: 500 [IU] via INTRAVENOUS
  Filled 2017-11-21: qty 5

## 2017-11-21 MED ORDER — SODIUM CHLORIDE 0.9 % IV SOLN
1600.0000 mg | Freq: Once | INTRAVENOUS | Status: AC
  Administered 2017-11-21: 1600 mg via INTRAVENOUS
  Filled 2017-11-21: qty 26.3

## 2017-11-21 NOTE — Progress Notes (Signed)
[place] OFFICE PROGRESS NOTE  Patient Care Team: Maeola Sarah, [name], 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; [name]; [name]; [name]; [name]; [name];   # Stomach- H.Pylori [[facility]] S/p prevpack [may 2019]   ---------------------------------------------------    # Nov 2018-biliary obstruction status post ERCP; [name]; [name]; [name]; [name], [name], [name], and PALB2 c.1655A>G (p.Gln552Arg. This is still considered a normal result.  # Molecular testing Not done -------------------------------------------------   DIAGNOSIS: [ ]  Pancreatic cancer  STAGE:  II       ;GOALS: Curative  CURRENT/MOST RECENT THERAPY- Gem-Cap [June 3rd]          Malignant tumor of body of pancreas ([place])   10/11/2017 -  Chemotherapy    The patient had gemcitabine (GEMZAR) 1,600 mg in sodium chloride 0.9 % 250 mL chemo infusion, 1,672 mg, Intravenous,  Once, 2 of 4 cycles Administration: 1,600 mg (10/24/2017), 1,600 mg (11/07/2017)  for chemotherapy treatment.          INTERVAL HISTORY:  Nathaniel Saunders 47 y.o.  male pleasant patient above history of stage II pancreatic cancer/adenocarcinoma currently on adjuvant chemotherapy with gemcitabine Xeloda-   Patient currently status post cycle #1.  Appetite is good.  No  weight loss.  No abdominal pain.  Complains of loose stool maybe 1 a day/floating in the toilet bowl-orange color to it.  Otherwise no abdominal bloating.  Review of Systems  Constitutional: Negative for chills, diaphoresis, fever, malaise/fatigue and weight loss.  HENT: Negative for nosebleeds and sore throat.   Eyes: Negative for double vision.  Respiratory: Negative for cough, hemoptysis, sputum production, shortness of breath and wheezing.   Cardiovascular: Negative for chest pain, palpitations, orthopnea and leg swelling.  Gastrointestinal: Positive for diarrhea. Negative for abdominal pain, blood in stool, constipation, heartburn, melena, nausea and vomiting.  Genitourinary: Negative for dysuria, frequency and urgency.  Musculoskeletal: Negative for back pain and joint pain.  Skin: Negative.  Negative for itching and rash.  Neurological: Negative for dizziness, tingling, focal weakness, weakness and headaches.  Endo/Heme/Allergies: Does not bruise/bleed easily.  Psychiatric/Behavioral: Negative for depression. The patient is not nervous/anxious and does not have insomnia.       PAST MEDICAL HISTORY :  Past Medical History:  Diagnosis Date  . Asthma   . Genetic testing 05/20/2017   Multi-Cancer panel (83 genes) @ [facility] - No pathogenic mutations detected  . Pancreatic cancer ([place])   . Pancreatic mass     PAST SURGICAL HISTORY :   Past Surgical History:  Procedure Laterality Date  . ERCP N/A 04/21/2017   Procedure: ENDOSCOPIC RETROGRADE CHOLANGIOPANCREATOGRAPHY (ERCP);  Surgeon: Lucilla Lame, MD;  Location: [facility] ENDOSCOPY;  Service: Endoscopy;  Laterality: N/A;  . ERCP N/A 05/06/2017   Procedure: ENDOSCOPIC RETROGRADE CHOLANGIOPANCREATOGRAPHY (ERCP);  Surgeon: Lucilla Lame, MD;  Location: [facility] ENDOSCOPY;  Service: Endoscopy;  Laterality: N/A;  . PANCREATECTOMY, PROXIMAL SUBTOTAL WITH TOTAL DUODENECTOMY, PARTIAL GASTRECTOMY, CHOLEDOCHOENTEROSTOMY  AND GASTROJEJUNOSTOMY  (WHIPPLE-TYPE PROCEDURE); WITHOUT PANCREATOJEJUNOSTOMY  09/06/2017  . PORTA CATH INSERTION N/A 04/18/2017   Procedure: PORTA CATH INSERTION;  Surgeon: Algernon Huxley, MD;  Location: [place] CV LAB;  Service: Cardiovascular;  Laterality: N/A;    FAMILY HISTORY :   Family History  Problem Relation Age of Onset  . Pancreatic cancer Mother 46       deceased 30  . Diabetes Father   . Colon cancer Maternal Grandmother 70       deceased 66    SOCIAL HISTORY:   Social History   Tobacco Use  . Smoking status: Former Smoker    Packs/day: 0.25    Years: 5.00    Pack years: 1.25    Types: Cigarettes    Last attempt to quit: 05/07/2015    Years since quitting: 2.5  . Smokeless tobacco: Never Used  Substance Use Topics  . Alcohol use: No  . Drug use: No    ALLERGIES:  has No Known Allergies.  MEDICATIONS:  Current Outpatient Medications  Medication Sig Dispense Refill  . Ascorbic Acid (VITA-C PO) Take 1,000 mg by mouth daily.    . capecitabine (XELODA) 150 MG tablet Take 2 tablets (300 mg total) by mouth 2 (two) times daily after a meal. Take with two 573m tablets. Take for 3 weeks on, 1 week off. 84 tablet 5  . capecitabine (XELODA) 500 MG tablet Take 2 tablets (1,000 mg total) by mouth 2 (two) times daily after a meal. Take with two 1539mtablets. Take for 3 weeks on, 1 week off. 84 tablet 5  . ondansetron (ZOFRAN) 8 MG tablet One pill every 8 hours as needed for nausea/vomitting. 30 tablet 0  . pantoprazole (PROTONIX) 40 MG tablet Take 1 tablet by mouth daily.    . Marland Kitchencetaminophen (TYLENOL) 325 MG tablet Take 2 tablets (650 mg total) by mouth every 6 (six) hours as needed for mild pain or fever (headache). (Patient not taking: Reported on 11/07/2017) 20 tablet 0  . albuterol (PROVENTIL HFA;VENTOLIN HFA) 108 (90 Base) MCG/ACT inhaler Inhale 1-2 puffs every 6 (six) hours as needed into the lungs for wheezing or shortness of breath.    . traMADol (ULTRAM) 50 MG tablet Take 1 tablet  (50 mg total) by mouth every 8 (eight) hours as needed. (Patient not taking: Reported on 11/07/2017) 30 tablet 0   No current facility-administered medications for this visit.    Facility-Administered Medications Ordered in Other Visits  Medication Dose Route Frequency Provider Last Rate Last Dose  . 0.9 %  sodium chloride infusion   Intravenous Once BrCharlaine Dalton, MD      . gemcitabine (GEMZAR) 1,600 mg in sodium chloride 0.9 % 250 mL chemo infusion  1,600 mg Intravenous Once BrCharlaine Dalton, MD      . heparin lock flush 100 unit/mL  500 Units Intravenous Once BrCharlaine Dalton, MD      . prochlorperazine (COMPAZINE) tablet 10 mg  10 mg Oral Once BrCharlaine Dalton, MD      . sodium chloride flush (NS) 0.9 % injection 10 mL  10 mL Intravenous PRN BrCammie SickleMD   10 mL at 06/15/17 0941  . sodium chloride flush (NS) 0.9 % injection 10 mL  10 mL Intravenous PRN BrCammie SickleMD   10 mL at 11/21/17 0855    PHYSICAL EXAMINATION: ECOG PERFORMANCE STATUS: 0 - Asymptomatic  BP 124/80 (BP Location: Left Arm, Patient Position: Sitting)  Pulse 62   Temp 97.9 F (36.6 C) (Tympanic)   Resp 20   Ht 5' 3"  (1.6 m)   Wt 139 lb (63 kg)   BMI 24.62 kg/m   Filed Weights   11/21/17 0914  Weight: 139 lb (63 kg)    GENERAL: Well-nourished well-developed; Alert, no distress and comfortable.  Alone.  EYES: no pallor or icterus OROPHARYNX: no thrush or ulceration; NECK: supple; no lymph nodes felt. LYMPH:  no palpable lymphadenopathy in the axillary or inguinal regions LUNGS: Decreased breath sounds auscultation bilaterally. No wheeze or crackles HEART/CVS: regular rate & rhythm and no murmurs; No lower extremity edema ABDOMEN:abdomen soft, non-tender and normal bowel sounds. No hepatomegaly or splenomegaly.  Musculoskeletal:no cyanosis of digits and no clubbing  PSYCH: alert & oriented x 3 with fluent speech NEURO: no focal motor/sensory deficits SKIN:  no  rashes or significant lesions    LABORATORY DATA:  I have reviewed the data as listed    Component Value Date/Time   NA 139 11/21/2017 0850   K 3.7 11/21/2017 0850   CL 105 11/21/2017 0850   CO2 23 11/21/2017 0850   GLUCOSE 104 (H) 11/21/2017 0850   BUN 6 11/21/2017 0850   CREATININE 0.94 11/21/2017 0850   CALCIUM 9.1 11/21/2017 0850   PROT 7.2 11/21/2017 0850   ALBUMIN 4.0 11/21/2017 0850   AST 28 11/21/2017 0850   ALT 17 11/21/2017 0850   ALKPHOS 61 11/21/2017 0850   BILITOT 0.7 11/21/2017 0850   GFRNONAA >60 11/21/2017 0850   GFRAA >60 11/21/2017 0850    No results found for: SPEP, UPEP  Lab Results  Component Value Date   WBC 4.3 11/21/2017   NEUTROABS 1.6 11/21/2017   HGB 12.8 (L) 11/21/2017   HCT 37.1 (L) 11/21/2017   MCV 93.7 11/21/2017   PLT 257 11/21/2017      Chemistry      Component Value Date/Time   NA 139 11/21/2017 0850   K 3.7 11/21/2017 0850   CL 105 11/21/2017 0850   CO2 23 11/21/2017 0850   BUN 6 11/21/2017 0850   CREATININE 0.94 11/21/2017 0850      Component Value Date/Time   CALCIUM 9.1 11/21/2017 0850   ALKPHOS 61 11/21/2017 0850   AST 28 11/21/2017 0850   ALT 17 11/21/2017 0850   BILITOT 0.7 11/21/2017 0850       RADIOGRAPHIC STUDIES: I have personally reviewed the radiological images as listed and agreed with the findings in the report. No results found.   ASSESSMENT & PLAN:  Malignant tumor of body of pancreas ([place]) #Pancreatic adenocarcinoma stage II s/p Whipple's. Currently on adjuvant chemotherapy with gemcitabine-Xeloda.  # proceed with cycle #2  Gemcitabine weekly x3; Xeloda 3 weeks on 1 week off; every 28-day cycle.  Labs today reviewed;  acceptable for treatment today.    # Pain control-better controlled;stable; continue tramdaol    # Foul smelling stool-  ? Malabsorption- monitor for now. If worse recommend pancrelipase.   # port malfunction- ? Alteplase today.   # Chemo today; lab-cbc-gem in 1 week; follow up  with me in 2 week/labs-Gem/MD.    No orders of the defined types were placed in this encounter.  All questions were answered. The patient knows to call the clinic with any problems, questions or concerns.      Cammie Sickle, MD 11/21/2017 10:13 AM

## 2017-11-21 NOTE — Assessment & Plan Note (Addendum)
#  Pancreatic adenocarcinoma stage II s/p Whipple's. Currently on adjuvant chemotherapy with gemcitabine-Xeloda.  # proceed with cycle #2  Gemcitabine weekly x3; Xeloda 3 weeks on 1 week off; every 28-day cycle.  Labs today reviewed;  acceptable for treatment today.    # Pain control-better controlled;stable; continue tramdaol    # Foul smelling stool-  ? Malabsorption- monitor for now. If worse recommend pancrelipase.   # port malfunction- ? Alteplase today.   # Chemo today; lab-cbc-gem in 1 week; follow up with me in 2 week/labs-Gem/MD.

## 2017-11-21 NOTE — Progress Notes (Signed)
Flushed pts port prior to tx, great blood return noted, MD team notified.

## 2017-11-21 NOTE — Progress Notes (Signed)
Patient taking xeloda as directed. He has not missed any dosing. He denies any side effects with the xeloda. This is his last day of the xeloda before his treatment break. Patient already has received his next shipment as this delivered this weekend. Patient reports greasy texture in his stools. He went out of town this weekend and ate lots of burgers. This was a change in his normal diet from eating healthy veggies. He contributes this symptoms to the change in diet.  Nurse did not obtain a blood return from patient's port. Port flushes easily per Patient.

## 2017-11-28 ENCOUNTER — Inpatient Hospital Stay: Payer: Medicaid Other

## 2017-11-28 VITALS — BP 136/86 | HR 56 | Resp 20 | Wt 140.0 lb

## 2017-11-28 DIAGNOSIS — Z5111 Encounter for antineoplastic chemotherapy: Secondary | ICD-10-CM | POA: Diagnosis not present

## 2017-11-28 DIAGNOSIS — C251 Malignant neoplasm of body of pancreas: Secondary | ICD-10-CM

## 2017-11-28 LAB — CBC WITH DIFFERENTIAL/PLATELET
Basophils Absolute: 0 10*3/uL (ref 0–0.1)
Basophils Relative: 0 %
EOS ABS: 0 10*3/uL (ref 0–0.7)
EOS PCT: 1 %
HCT: 35 % — ABNORMAL LOW (ref 40.0–52.0)
Hemoglobin: 12.2 g/dL — ABNORMAL LOW (ref 13.0–18.0)
LYMPHS ABS: 2.8 10*3/uL (ref 1.0–3.6)
Lymphocytes Relative: 69 %
MCH: 32.4 pg (ref 26.0–34.0)
MCHC: 34.9 g/dL (ref 32.0–36.0)
MCV: 92.8 fL (ref 80.0–100.0)
MONO ABS: 0.2 10*3/uL (ref 0.2–1.0)
Monocytes Relative: 5 %
Neutro Abs: 1 10*3/uL — ABNORMAL LOW (ref 1.4–6.5)
Neutrophils Relative %: 25 %
PLATELETS: 253 10*3/uL (ref 150–440)
RBC: 3.77 MIL/uL — ABNORMAL LOW (ref 4.40–5.90)
RDW: 14.5 % (ref 11.5–14.5)
WBC: 4.1 10*3/uL (ref 3.8–10.6)

## 2017-11-28 LAB — BASIC METABOLIC PANEL
Anion gap: 8 (ref 5–15)
BUN: 6 mg/dL (ref 6–20)
CO2: 24 mmol/L (ref 22–32)
CREATININE: 0.83 mg/dL (ref 0.61–1.24)
Calcium: 9 mg/dL (ref 8.9–10.3)
Chloride: 107 mmol/L (ref 98–111)
GFR calc Af Amer: >60 mL/min (ref >60)
GFR calc non Af Amer: >60 mL/min (ref >60)
GLUCOSE: 112 mg/dL — AB (ref 70–99)
POTASSIUM: 4 mmol/L (ref 3.5–5.1)
SODIUM: 139 mmol/L (ref 135–145)

## 2017-11-28 MED ORDER — PROCHLORPERAZINE MALEATE 10 MG PO TABS
10.0000 mg | ORAL_TABLET | Freq: Once | ORAL | Status: AC
  Administered 2017-11-28: 10 mg via ORAL
  Filled 2017-11-28: qty 1

## 2017-11-28 MED ORDER — SODIUM CHLORIDE 0.9 % IV SOLN
Freq: Once | INTRAVENOUS | Status: AC
  Administered 2017-11-28: 10:00:00 via INTRAVENOUS
  Filled 2017-11-28: qty 1000

## 2017-11-28 MED ORDER — HEPARIN SOD (PORK) LOCK FLUSH 100 UNIT/ML IV SOLN
500.0000 [IU] | Freq: Once | INTRAVENOUS | Status: DC
  Filled 2017-11-28: qty 5

## 2017-11-28 MED ORDER — HEPARIN SOD (PORK) LOCK FLUSH 100 UNIT/ML IV SOLN
500.0000 [IU] | Freq: Once | INTRAVENOUS | Status: AC | PRN
  Administered 2017-11-28: 500 [IU]

## 2017-11-28 MED ORDER — SODIUM CHLORIDE 0.9% FLUSH
10.0000 mL | INTRAVENOUS | Status: DC | PRN
  Administered 2017-11-28: 10 mL via INTRAVENOUS
  Filled 2017-11-28: qty 10

## 2017-11-28 MED ORDER — GEMCITABINE HCL CHEMO INJECTION 1 GM/26.3ML
1600.0000 mg | Freq: Once | INTRAVENOUS | Status: AC
  Administered 2017-11-28: 1600 mg via INTRAVENOUS
  Filled 2017-11-28: qty 26.3

## 2017-12-05 ENCOUNTER — Encounter: Payer: Self-pay | Admitting: Internal Medicine

## 2017-12-05 ENCOUNTER — Inpatient Hospital Stay: Payer: Medicaid Other

## 2017-12-05 ENCOUNTER — Inpatient Hospital Stay (HOSPITAL_BASED_OUTPATIENT_CLINIC_OR_DEPARTMENT_OTHER): Payer: Medicaid Other | Admitting: Internal Medicine

## 2017-12-05 VITALS — BP 106/69 | HR 63 | Temp 96.3°F | Resp 16 | Wt 134.6 lb

## 2017-12-05 DIAGNOSIS — Z800 Family history of malignant neoplasm of digestive organs: Secondary | ICD-10-CM

## 2017-12-05 DIAGNOSIS — C251 Malignant neoplasm of body of pancreas: Secondary | ICD-10-CM | POA: Diagnosis not present

## 2017-12-05 DIAGNOSIS — K909 Intestinal malabsorption, unspecified: Secondary | ICD-10-CM | POA: Diagnosis not present

## 2017-12-05 DIAGNOSIS — Z79899 Other long term (current) drug therapy: Secondary | ICD-10-CM

## 2017-12-05 DIAGNOSIS — G893 Neoplasm related pain (acute) (chronic): Secondary | ICD-10-CM

## 2017-12-05 DIAGNOSIS — R918 Other nonspecific abnormal finding of lung field: Secondary | ICD-10-CM

## 2017-12-05 DIAGNOSIS — Z87891 Personal history of nicotine dependence: Secondary | ICD-10-CM

## 2017-12-05 DIAGNOSIS — J45909 Unspecified asthma, uncomplicated: Secondary | ICD-10-CM

## 2017-12-05 DIAGNOSIS — Z5111 Encounter for antineoplastic chemotherapy: Secondary | ICD-10-CM

## 2017-12-05 LAB — CBC WITH DIFFERENTIAL/PLATELET
BASOS PCT: 1 %
Basophils Absolute: 0 10*3/uL (ref 0–0.1)
EOS ABS: 0 10*3/uL (ref 0–0.7)
Eosinophils Relative: 1 %
HCT: 31.6 % — ABNORMAL LOW (ref 40.0–52.0)
Hemoglobin: 10.9 g/dL — ABNORMAL LOW (ref 13.0–18.0)
LYMPHS PCT: 69 %
Lymphs Abs: 1.3 10*3/uL (ref 1.0–3.6)
MCH: 32 pg (ref 26.0–34.0)
MCHC: 34.5 g/dL (ref 32.0–36.0)
MCV: 92.6 fL (ref 80.0–100.0)
MONO ABS: 0.1 10*3/uL — AB (ref 0.2–1.0)
Monocytes Relative: 4 %
NEUTROS ABS: 0.5 10*3/uL — AB (ref 1.4–6.5)
NEUTROS PCT: 25 %
PLATELETS: 90 10*3/uL — AB (ref 150–440)
RBC: 3.42 MIL/uL — ABNORMAL LOW (ref 4.40–5.90)
RDW: 15.1 % — AB (ref 11.5–14.5)
WBC: 1.8 10*3/uL — ABNORMAL LOW (ref 3.8–10.6)

## 2017-12-05 LAB — COMPREHENSIVE METABOLIC PANEL
ALBUMIN: 3.8 g/dL (ref 3.5–5.0)
ALT: 21 U/L (ref 0–44)
ANION GAP: 6 (ref 5–15)
AST: 24 U/L (ref 15–41)
Alkaline Phosphatase: 55 U/L (ref 38–126)
BUN: 7 mg/dL (ref 6–20)
CO2: 22 mmol/L (ref 22–32)
Calcium: 8.8 mg/dL — ABNORMAL LOW (ref 8.9–10.3)
Chloride: 109 mmol/L (ref 98–111)
Creatinine, Ser: 0.74 mg/dL (ref 0.61–1.24)
GFR calc non Af Amer: >60 mL/min (ref >60)
GLUCOSE: 98 mg/dL (ref 70–99)
POTASSIUM: 3.5 mmol/L (ref 3.5–5.1)
SODIUM: 137 mmol/L (ref 135–145)
TOTAL PROTEIN: 7 g/dL (ref 6.5–8.1)
Total Bilirubin: 0.8 mg/dL (ref 0.3–1.2)

## 2017-12-05 MED ORDER — HEPARIN SOD (PORK) LOCK FLUSH 100 UNIT/ML IV SOLN
500.0000 [IU] | Freq: Once | INTRAVENOUS | Status: AC
  Administered 2017-12-05: 500 [IU] via INTRAVENOUS
  Filled 2017-12-05: qty 5

## 2017-12-05 MED ORDER — SODIUM CHLORIDE 0.9% FLUSH
10.0000 mL | INTRAVENOUS | Status: DC | PRN
  Administered 2017-12-05: 10 mL via INTRAVENOUS
  Filled 2017-12-05: qty 10

## 2017-12-05 NOTE — Progress Notes (Signed)
[place] OFFICE PROGRESS NOTE  Patient Care Team: Maeola Sarah, [name], 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; [name]; [name]; [name]; [name]; [name];   # Stomach- H.Pylori [[facility]] S/p prevpack [may 2019]   ---------------------------------------------------    # Nov 2018-biliary obstruction status post ERCP; [name]; [name]; [name]; [name], [name], [name], and PALB2 c.1655A>G (p.Gln552Arg. This is still considered a normal result.  # Molecular testing Not done -------------------------------------------------   DIAGNOSIS: [ ]  Pancreatic cancer  STAGE:  II       ;GOALS: Curative  CURRENT/MOST RECENT THERAPY- Gem-Cap [June 3rd]          Malignant tumor of body of pancreas ([place])   10/11/2017 -  Chemotherapy    The patient had gemcitabine (GEMZAR) 1,600 mg in sodium chloride 0.9 % 250 mL chemo infusion, 1,672 mg, Intravenous,  Once, 2 of 4 cycles Administration: 1,600 mg (10/24/2017), 1,600 mg (11/07/2017), 1,600 mg (11/21/2017), 1,600 mg (11/28/2017)  for chemotherapy treatment.          INTERVAL HISTORY:  Nathaniel Saunders 47 y.o.  male pleasant patient above history of stage II pancreatic cancer currently on adjuvant chemotherapy with gemcitabine Xeloda is here for follow-up.  Patient denies any  skin rash.  Denies any significantly worsening loose stools.  Appetite is good.  Denies any significant pain.  No nausea no vomiting.  No fevers or chills.  Review of Systems  Constitutional: Negative for chills, diaphoresis, fever, malaise/fatigue and weight loss.  HENT: Negative for nosebleeds and sore throat.   Eyes: Negative for double vision.  Respiratory: Negative for cough, hemoptysis, sputum production, shortness of breath and wheezing.   Cardiovascular: Negative for chest pain, palpitations, orthopnea and leg swelling.  Gastrointestinal: Negative for abdominal pain, blood in stool, constipation, diarrhea, heartburn, melena, nausea and vomiting.  Genitourinary: Negative for dysuria, frequency and urgency.  Musculoskeletal: Negative for back pain and joint pain.  Skin: Negative.  Negative for itching and rash.  Neurological: Negative for dizziness, tingling, focal weakness, weakness and headaches.  Endo/Heme/Allergies: Does not bruise/bleed easily.  Psychiatric/Behavioral: Negative for depression. The patient is not nervous/anxious and does not have insomnia.       PAST MEDICAL HISTORY :  Past Medical History:  Diagnosis Date  . Asthma   . Genetic testing 05/20/2017   Multi-Cancer panel (83 genes) @ [facility] - No pathogenic mutations detected  . Pancreatic cancer ([place])   . Pancreatic mass     PAST SURGICAL HISTORY :   Past Surgical History:  Procedure Laterality Date  . ERCP N/A 04/21/2017   Procedure: ENDOSCOPIC RETROGRADE CHOLANGIOPANCREATOGRAPHY (ERCP);  Surgeon: Lucilla Lame, MD;  Location: [facility] ENDOSCOPY;  Service: Endoscopy;  Laterality: N/A;  . ERCP N/A 05/06/2017   Procedure: ENDOSCOPIC RETROGRADE CHOLANGIOPANCREATOGRAPHY (ERCP);  Surgeon: Lucilla Lame, MD;  Location: [facility] ENDOSCOPY;  Service: Endoscopy;  Laterality: N/A;  . PANCREATECTOMY, PROXIMAL SUBTOTAL WITH TOTAL DUODENECTOMY, PARTIAL GASTRECTOMY, CHOLEDOCHOENTEROSTOMY AND  GASTROJEJUNOSTOMY (WHIPPLE-TYPE  PROCEDURE); WITHOUT PANCREATOJEJUNOSTOMY  09/06/2017  . PORTA CATH INSERTION N/A 04/18/2017   Procedure: PORTA CATH INSERTION;  Surgeon: Algernon Huxley, MD;  Location: [place] CV LAB;  Service: Cardiovascular;  Laterality: N/A;    FAMILY HISTORY :   Family History  Problem Relation Age of Onset  . Pancreatic cancer Mother 68       deceased 53  . Diabetes Father   . Colon cancer Maternal Grandmother 70       deceased 73    SOCIAL HISTORY:   Social History   Tobacco Use  . Smoking status: Former Smoker    Packs/day: 0.25    Years: 5.00    Pack years: 1.25    Types: Cigarettes    Last attempt to quit: 05/07/2015    Years since quitting: 2.5  . Smokeless tobacco: Never Used  Substance Use Topics  . Alcohol use: No  . Drug use: No    ALLERGIES:  has No Known Allergies.  MEDICATIONS:  Current Outpatient Medications  Medication Sig Dispense Refill  . acetaminophen (TYLENOL) 325 MG tablet Take 2 tablets (650 mg total) by mouth every 6 (six) hours as needed for mild pain or fever (headache). 20 tablet 0  . albuterol (PROVENTIL HFA;VENTOLIN HFA) 108 (90 Base) MCG/ACT inhaler Inhale 1-2 puffs every 6 (six) hours as needed into the lungs for wheezing or shortness of breath.    . Ascorbic Acid (VITA-C PO) Take 1,000 mg by mouth daily.    . capecitabine (XELODA) 150 MG tablet Take 2 tablets (300 mg total) by mouth 2 (two) times daily after a meal. Take with two 556m tablets. Take for 3 weeks on, 1 week off. 84 tablet 5  . capecitabine (XELODA) 500 MG tablet Take 2 tablets (1,000 mg total) by mouth 2 (two) times daily after a meal. Take with two 1573mtablets. Take for 3 weeks on, 1 week off. 84 tablet 5  . ondansetron (ZOFRAN) 8 MG tablet One pill every 8 hours as needed for nausea/vomitting. 30 tablet 0  . pantoprazole (PROTONIX) 40 MG tablet Take 1 tablet by mouth daily.    . traMADol (ULTRAM) 50 MG tablet Take 1 tablet (50 mg total) by mouth every 8 (eight) hours as needed. 30  tablet 0   No current facility-administered medications for this visit.    Facility-Administered Medications Ordered in Other Visits  Medication Dose Route Frequency Provider Last Rate Last Dose  . sodium chloride flush (NS) 0.9 % injection 10 mL  10 mL Intravenous PRN BrCammie SickleMD   10 mL at 06/15/17 0941    PHYSICAL EXAMINATION: ECOG PERFORMANCE STATUS: 0 - Asymptomatic  BP 106/69 (BP Location: Left Arm, Patient Position: Sitting)   Pulse 63   Temp (!) 96.3 F (35.7 C) (Tympanic)   Resp 16   Wt 134 lb 9.6 oz (61.1 kg)   BMI 23.84 kg/m   Filed Weights   12/05/17 0923  Weight: 134 lb 9.6 oz (61.1 kg)    GENERAL: Well-nourished well-developed; Alert, no distress and comfortable.  He is alone. EYES: no pallor or icterus OROPHARYNX: no thrush or ulceration; NECK: supple; no lymph nodes felt. LYMPH:  no palpable lymphadenopathy in the axillary or inguinal regions LUNGS: Decreased breath sounds auscultation bilaterally. No wheeze or crackles HEART/CVS: regular rate & rhythm and no murmurs; No lower extremity edema ABDOMEN:abdomen soft, non-tender and normal bowel sounds. No hepatomegaly or splenomegaly.  Musculoskeletal:no cyanosis of digits and no clubbing  PSYCH: alert & oriented x 3 with fluent speech NEURO: no focal motor/sensory deficits SKIN:  no rashes or significant lesions    LABORATORY DATA:  I have reviewed the data as listed    Component Value Date/Time   NA 137 12/05/2017 0911   K 3.5 12/05/2017 0911   CL 109 12/05/2017 0911   CO2 22 12/05/2017 0911   GLUCOSE 98 12/05/2017 0911   BUN 7 12/05/2017 0911   CREATININE 0.74 12/05/2017 0911   CALCIUM 8.8 (L) 12/05/2017 0911   PROT 7.0 12/05/2017 0911   ALBUMIN 3.8 12/05/2017 0911   AST 24 12/05/2017 0911   ALT 21 12/05/2017 0911   ALKPHOS 55 12/05/2017 0911   BILITOT 0.8 12/05/2017 0911   GFRNONAA >60 12/05/2017 0911   GFRAA >60 12/05/2017 0911    No results found for: SPEP, UPEP  Lab  Results  Component Value Date   WBC 1.8 (L) 12/05/2017   NEUTROABS 0.5 (L) 12/05/2017   HGB 10.9 (L) 12/05/2017   HCT 31.6 (L) 12/05/2017   MCV 92.6 12/05/2017   PLT 90 (L) 12/05/2017      Chemistry      Component Value Date/Time   NA 137 12/05/2017 0911   K 3.5 12/05/2017 0911   CL 109 12/05/2017 0911   CO2 22 12/05/2017 0911   BUN 7 12/05/2017 0911   CREATININE 0.74 12/05/2017 0911      Component Value Date/Time   CALCIUM 8.8 (L) 12/05/2017 0911   ALKPHOS 55 12/05/2017 0911   AST 24 12/05/2017 0911   ALT 21 12/05/2017 0911   BILITOT 0.8 12/05/2017 0911       RADIOGRAPHIC STUDIES: I have personally reviewed the radiological images as listed and agreed with the findings in the report. No results found.   ASSESSMENT & PLAN:  Malignant tumor of body of pancreas ([place]) #Pancreatic adenocarcinoma stage II s/p Whipple's. Currently on adjuvant chemotherapy with gemcitabine-Xeloda. STABLE.   # Today is cycle #2; day-15 [ Gemcitabine weekly x3; Xeloda 3 weeks on 1 week off; every 28-day cycle.]  HOLD chemo today; as Labs today reviewed- WBC- ANC-0.5; platelets- 90.  If patient continues to have significant neutropenia/thrombocytopenia-growth factor support/stress reduction could be considered.  # Pain control-better controlled; stable  # Foul smelling stool-  ? Malabsorption;  improved with dietary discretion.  Monitor closely.  # HOLD chemotoday;  follow up with me in 2 week/labs-Gem/MD.    Orders Placed This Encounter  Procedures  . CBC with Differential/Platelet    Standing Status:   Future    Standing Expiration Date:   12/06/2018  . Comprehensive metabolic panel    Standing Status:   Future    Standing Expiration Date:   12/06/2018   All questions were answered. The patient knows to call the clinic with any problems, questions or concerns.      Cammie Sickle, MD 12/06/2017 7:48 PM

## 2017-12-05 NOTE — Assessment & Plan Note (Addendum)
#  Pancreatic adenocarcinoma stage II s/p Whipple's. Currently on adjuvant chemotherapy with gemcitabine-Xeloda. STABLE.   # Today is cycle #2; day-15 [ Gemcitabine weekly x3; Xeloda 3 weeks on 1 week off; every 28-day cycle.]  HOLD chemo today; as Labs today reviewed- WBC- ANC-0.5; platelets- 90.  If patient continues to have significant neutropenia/thrombocytopenia-growth factor support/stress reduction could be considered.  # Pain control-better controlled; stable  # Foul smelling stool-  ? Malabsorption;  improved with dietary discretion.  Monitor closely.  # HOLD chemotoday;  follow up with me in 2 week/labs-Gem/MD.

## 2017-12-19 ENCOUNTER — Inpatient Hospital Stay: Payer: Medicaid Other | Admitting: Internal Medicine

## 2017-12-19 ENCOUNTER — Telehealth: Payer: Self-pay | Admitting: *Deleted

## 2017-12-19 ENCOUNTER — Inpatient Hospital Stay: Payer: Medicaid Other

## 2017-12-19 NOTE — Assessment & Plan Note (Deleted)
#  Pancreatic adenocarcinoma stage II s/p Whipple's. Currently on adjuvant chemotherapy with gemcitabine-Xeloda. STABLE.   # Today is cycle #2; day-15 [ Gemcitabine weekly x3; Xeloda 3 weeks on 1 week off; every 28-day cycle.]  HOLD chemo today; as Labs today reviewed- WBC- ANC-0.5; platelets- 90.  If patient continues to have significant neutropenia/thrombocytopenia-growth factor support/stress reduction could be considered.  # Pain control-better controlled; stable  # Foul smelling stool-  ? Malabsorption;  improved with dietary discretion.  Monitor closely.  # HOLD chemotoday;  follow up with me in 2 week/labs-Gem/MD.

## 2017-12-19 NOTE — Telephone Encounter (Signed)
Patient no showed for chemotherapy apt. I personally called and left a vm for patient to call and r/s this apt.

## 2017-12-19 NOTE — Progress Notes (Deleted)
[facility] OFFICE PROGRESS NOTE  Patient Care Team: Maeola Sarah, [name], 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; [name]; [name]; [name]; [name]; [name];   # Stomach- H.Pylori [[facility]] S/p prevpack [may 2019]   ---------------------------------------------------    # Nov 2018-biliary obstruction status post ERCP; [name]; [name]; [name]; [name], [name], [name], and PALB2 c.1655A>G (p.Gln552Arg. This is still considered a normal result.  # Molecular testing Not done -------------------------------------------------   DIAGNOSIS: [ ]  Pancreatic cancer  STAGE:  II       ;GOALS: Curative  CURRENT/MOST RECENT THERAPY- Gem-Cap [June 3rd]          Malignant tumor of body of pancreas ([place])   10/11/2017 -  Chemotherapy    The patient had gemcitabine (GEMZAR) 1,600 mg in sodium chloride 0.9 % 250 mL chemo infusion, 1,672 mg, Intravenous,  Once, 2 of 4 cycles Administration: 1,600 mg (10/24/2017), 1,600 mg (11/07/2017), 1,600 mg (11/21/2017), 1,600 mg (11/28/2017)  for chemotherapy treatment.          INTERVAL HISTORY:  Nathaniel Saunders 47 y.o.  male pleasant patient above history of  ROS    PAST MEDICAL HISTORY :  Past Medical History:  Diagnosis Date  . Asthma   . Genetic testing 05/20/2017    Multi-Cancer panel (83 genes) @ [facility] - No pathogenic mutations detected  . Pancreatic cancer ([place])   . Pancreatic mass     PAST SURGICAL HISTORY :   Past Surgical History:  Procedure Laterality Date  . ERCP N/A 04/21/2017   Procedure: ENDOSCOPIC RETROGRADE CHOLANGIOPANCREATOGRAPHY (ERCP);  Surgeon: Lucilla Lame, MD;  Location: [facility] ENDOSCOPY;  Service: Endoscopy;  Laterality: N/A;  . ERCP N/A 05/06/2017   Procedure: ENDOSCOPIC RETROGRADE CHOLANGIOPANCREATOGRAPHY (ERCP);  Surgeon: Lucilla Lame, MD;  Location: [facility] ENDOSCOPY;  Service: Endoscopy;  Laterality: N/A;  . PANCREATECTOMY, PROXIMAL SUBTOTAL WITH TOTAL DUODENECTOMY, PARTIAL GASTRECTOMY, CHOLEDOCHOENTEROSTOMY AND GASTROJEJUNOSTOMY (WHIPPLE-TYPE PROCEDURE); WITHOUT PANCREATOJEJUNOSTOMY  09/06/2017  . PORTA CATH INSERTION N/A 04/18/2017   Procedure: PORTA CATH INSERTION;  Surgeon: Algernon Huxley, MD;  Location: [place] CV LAB;  Service: Cardiovascular;  Laterality: N/A;    FAMILY HISTORY :   Family History  Problem Relation Age of Onset  . Pancreatic cancer Mother 42       deceased 58  . Diabetes Father   . Colon cancer Maternal Grandmother 70       deceased 69    SOCIAL HISTORY:   Social History   Tobacco Use  . Smoking status: Former Smoker    Packs/day: 0.25    Years: 5.00    Pack years: 1.25    Types: Cigarettes    Last attempt to quit: 05/07/2015    Years since quitting: 2.6  . Smokeless tobacco: Never Used  Substance Use Topics  . Alcohol use: No  . Drug use: No    ALLERGIES:  has No Known Allergies.  MEDICATIONS:  Current Outpatient  Medications  Medication Sig Dispense Refill  . acetaminophen (TYLENOL) 325 MG tablet Take 2 tablets (650 mg total) by mouth every 6 (six) hours as needed for mild pain or fever (headache). 20 tablet 0  . albuterol (PROVENTIL HFA;VENTOLIN HFA) 108 (90 Base) MCG/ACT inhaler Inhale 1-2 puffs every 6 (six) hours as needed into the lungs for wheezing or shortness of breath.     . Ascorbic Acid (VITA-C PO) Take 1,000 mg by mouth daily.    . capecitabine (XELODA) 150 MG tablet Take 2 tablets (300 mg total) by mouth 2 (two) times daily after a meal. Take with two 570m tablets. Take for 3 weeks on, 1 week off. 84 tablet 5  . capecitabine (XELODA) 500 MG tablet Take 2 tablets (1,000 mg total) by mouth 2 (two) times daily after a meal. Take with two 1546mtablets. Take for 3 weeks on, 1 week off. 84 tablet 5  . ondansetron (ZOFRAN) 8 MG tablet One pill every 8 hours as needed for nausea/vomitting. 30 tablet 0  . pantoprazole (PROTONIX) 40 MG tablet Take 1 tablet by mouth daily.    . traMADol (ULTRAM) 50 MG tablet Take 1 tablet (50 mg total) by mouth every 8 (eight) hours as needed. 30 tablet 0   No current facility-administered medications for this visit.    Facility-Administered Medications Ordered in Other Visits  Medication Dose Route Frequency Provider Last Rate Last Dose  . sodium chloride flush (NS) 0.9 % injection 10 mL  10 mL Intravenous PRN BrCammie SickleMD   10 mL at 06/15/17 0941    PHYSICAL EXAMINATION: ECOG PERFORMANCE STATUS: {CHL ONC ECOG [phone]}  There were no vitals taken for this visit.  There were no vitals filed for this visit.  GENERAL: Well-nourished well-developed; Alert, no distress and comfortable.  *** Alone/Accompanied by family.  EYES: no pallor or icterus OROPHARYNX: no thrush or ulceration; NECK: supple; no lymph nodes felt. LYMPH:  no palpable lymphadenopathy in the axillary or inguinal regions LUNGS: Decreased breath sounds auscultation bilaterally. No wheeze or crackles HEART/CVS: regular rate & rhythm and no murmurs; No lower extremity edema ABDOMEN:abdomen soft, non-tender and normal bowel sounds. No hepatomegaly or splenomegaly.  Musculoskeletal:no cyanosis of digits and no clubbing  PSYCH: alert & oriented x 3 with fluent speech NEURO: no focal motor/sensory deficits SKIN:  no rashes or significant  lesions    LABORATORY DATA:  I have reviewed the data as listed    Component Value Date/Time   NA 137 12/05/2017 0911   K 3.5 12/05/2017 0911   CL 109 12/05/2017 0911   CO2 22 12/05/2017 0911   GLUCOSE 98 12/05/2017 0911   BUN 7 12/05/2017 0911   CREATININE 0.74 12/05/2017 0911   CALCIUM 8.8 (L) 12/05/2017 0911   PROT 7.0 12/05/2017 0911   ALBUMIN 3.8 12/05/2017 0911   AST 24 12/05/2017 0911   ALT 21 12/05/2017 0911   ALKPHOS 55 12/05/2017 0911   BILITOT 0.8 12/05/2017 0911   GFRNONAA >60 12/05/2017 0911   GFRAA >60 12/05/2017 0911    No results found for: SPEP, UPEP  Lab Results  Component Value Date   WBC 1.8 (L) 12/05/2017   NEUTROABS 0.5 (L) 12/05/2017   HGB 10.9 (L) 12/05/2017   HCT 31.6 (L) 12/05/2017   MCV 92.6 12/05/2017   PLT 90 (L) 12/05/2017      Chemistry      Component Value Date/Time   NA 137 12/05/2017 0911   K 3.5 12/05/2017 0911  CL 109 12/05/2017 0911   CO2 22 12/05/2017 0911   BUN 7 12/05/2017 0911   CREATININE 0.74 12/05/2017 0911      Component Value Date/Time   CALCIUM 8.8 (L) 12/05/2017 0911   ALKPHOS 55 12/05/2017 0911   AST 24 12/05/2017 0911   ALT 21 12/05/2017 0911   BILITOT 0.8 12/05/2017 0911       RADIOGRAPHIC STUDIES: I have personally reviewed the radiological images as listed and agreed with the findings in the report. No results found.   ASSESSMENT & PLAN:  No problem-specific Assessment & Plan notes found for this encounter.   No orders of the defined types were placed in this encounter.  All questions were answered. The patient knows to call the clinic with any problems, questions or concerns.      Cammie Sickle, MD 12/19/2017 8:17 AM

## 2017-12-26 ENCOUNTER — Inpatient Hospital Stay (HOSPITAL_BASED_OUTPATIENT_CLINIC_OR_DEPARTMENT_OTHER): Payer: Medicaid Other | Admitting: Internal Medicine

## 2017-12-26 ENCOUNTER — Inpatient Hospital Stay: Payer: Medicaid Other

## 2017-12-26 ENCOUNTER — Inpatient Hospital Stay: Payer: Medicaid Other | Attending: Internal Medicine

## 2017-12-26 VITALS — BP 113/77 | HR 55 | Temp 97.1°F | Resp 16 | Wt 140.0 lb

## 2017-12-26 DIAGNOSIS — Z803 Family history of malignant neoplasm of breast: Secondary | ICD-10-CM | POA: Insufficient documentation

## 2017-12-26 DIAGNOSIS — C251 Malignant neoplasm of body of pancreas: Secondary | ICD-10-CM

## 2017-12-26 DIAGNOSIS — Z79899 Other long term (current) drug therapy: Secondary | ICD-10-CM | POA: Diagnosis not present

## 2017-12-26 DIAGNOSIS — R197 Diarrhea, unspecified: Secondary | ICD-10-CM | POA: Diagnosis not present

## 2017-12-26 DIAGNOSIS — Z800 Family history of malignant neoplasm of digestive organs: Secondary | ICD-10-CM | POA: Insufficient documentation

## 2017-12-26 DIAGNOSIS — Z5111 Encounter for antineoplastic chemotherapy: Secondary | ICD-10-CM | POA: Insufficient documentation

## 2017-12-26 DIAGNOSIS — J45909 Unspecified asthma, uncomplicated: Secondary | ICD-10-CM | POA: Diagnosis not present

## 2017-12-26 DIAGNOSIS — K909 Intestinal malabsorption, unspecified: Secondary | ICD-10-CM | POA: Insufficient documentation

## 2017-12-26 DIAGNOSIS — Z87891 Personal history of nicotine dependence: Secondary | ICD-10-CM | POA: Diagnosis not present

## 2017-12-26 DIAGNOSIS — D696 Thrombocytopenia, unspecified: Secondary | ICD-10-CM | POA: Insufficient documentation

## 2017-12-26 LAB — COMPREHENSIVE METABOLIC PANEL
ALBUMIN: 4.1 g/dL (ref 3.5–5.0)
ALK PHOS: 61 U/L (ref 38–126)
ALT: 15 U/L (ref 0–44)
AST: 24 U/L (ref 15–41)
Anion gap: 7 (ref 5–15)
BILIRUBIN TOTAL: 1 mg/dL (ref 0.3–1.2)
BUN: 6 mg/dL (ref 6–20)
CALCIUM: 9.1 mg/dL (ref 8.9–10.3)
CO2: 27 mmol/L (ref 22–32)
Chloride: 107 mmol/L (ref 98–111)
Creatinine, Ser: 1.04 mg/dL (ref 0.61–1.24)
GFR calc Af Amer: >60 mL/min (ref >60)
GFR calc non Af Amer: >60 mL/min (ref >60)
GLUCOSE: 107 mg/dL — AB (ref 70–99)
Potassium: 3.8 mmol/L (ref 3.5–5.1)
Sodium: 141 mmol/L (ref 135–145)
Total Protein: 7.4 g/dL (ref 6.5–8.1)

## 2017-12-26 LAB — CBC WITH DIFFERENTIAL/PLATELET
BASOS ABS: 0 10*3/uL (ref 0–0.1)
Basophils Relative: 0 %
EOS PCT: 2 %
Eosinophils Absolute: 0.1 10*3/uL (ref 0–0.7)
HCT: 38.8 % — ABNORMAL LOW (ref 40.0–52.0)
Hemoglobin: 13.3 g/dL (ref 13.0–18.0)
LYMPHS PCT: 53 %
Lymphs Abs: 3.5 10*3/uL (ref 1.0–3.6)
MCH: 33 pg (ref 26.0–34.0)
MCHC: 34.1 g/dL (ref 32.0–36.0)
MCV: 96.7 fL (ref 80.0–100.0)
MONO ABS: 0.7 10*3/uL (ref 0.2–1.0)
MONOS PCT: 10 %
Neutro Abs: 2.3 10*3/uL (ref 1.4–6.5)
Neutrophils Relative %: 35 %
PLATELETS: 343 10*3/uL (ref 150–440)
RBC: 4.02 MIL/uL — ABNORMAL LOW (ref 4.40–5.90)
RDW: 22.7 % — AB (ref 11.5–14.5)
WBC: 6.6 10*3/uL (ref 3.8–10.6)

## 2017-12-26 MED ORDER — SODIUM CHLORIDE 0.9% FLUSH
10.0000 mL | INTRAVENOUS | Status: DC | PRN
  Administered 2017-12-26: 10 mL via INTRAVENOUS
  Filled 2017-12-26: qty 10

## 2017-12-26 MED ORDER — HEPARIN SOD (PORK) LOCK FLUSH 100 UNIT/ML IV SOLN
500.0000 [IU] | Freq: Once | INTRAVENOUS | Status: AC
  Administered 2017-12-26: 500 [IU] via INTRAVENOUS
  Filled 2017-12-26: qty 5

## 2017-12-26 MED ORDER — SODIUM CHLORIDE 0.9% FLUSH
10.0000 mL | INTRAVENOUS | Status: DC | PRN
  Filled 2017-12-26: qty 10

## 2017-12-26 MED ORDER — PROCHLORPERAZINE MALEATE 10 MG PO TABS
10.0000 mg | ORAL_TABLET | Freq: Once | ORAL | Status: AC
  Administered 2017-12-26: 10 mg via ORAL
  Filled 2017-12-26: qty 1

## 2017-12-26 MED ORDER — SODIUM CHLORIDE 0.9 % IV SOLN
1400.0000 mg | Freq: Once | INTRAVENOUS | Status: AC
  Administered 2017-12-26: 1400 mg via INTRAVENOUS
  Filled 2017-12-26: qty 26.3

## 2017-12-26 MED ORDER — HEPARIN SOD (PORK) LOCK FLUSH 100 UNIT/ML IV SOLN: 500.0000 [IU] | Freq: Once | INTRAVENOUS | Status: DC | PRN

## 2017-12-26 MED ORDER — SODIUM CHLORIDE 0.9 % IV SOLN
Freq: Once | INTRAVENOUS | Status: AC
  Administered 2017-12-26: 10:00:00 via INTRAVENOUS
  Filled 2017-12-26: qty 1000

## 2017-12-26 MED FILL — CAPECITABINE 150 MG TABS: 150 | 28 days supply | Qty: 84 | Fill #2

## 2017-12-26 MED FILL — CAPECITABINE 500 MG TABLET: 500 | 28 days supply | Qty: 84 | Fill #2

## 2017-12-26 NOTE — Progress Notes (Signed)
[place] OFFICE PROGRESS NOTE  Patient Care Team: Maeola Sarah, [name], 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; [name]; [name]; [name]; [name]; [name];   # Stomach- H.Pylori [[facility]] S/p prevpack [may 2019]   ---------------------------------------------------    # Nov 2018-biliary obstruction status post ERCP; [name]; [name]; [name]; [name], [name], [name], and PALB2 c.1655A>G (p.Gln552Arg. This is still considered a normal result.  # Molecular testing Not done -------------------------------------------------   DIAGNOSIS: [ ]  Pancreatic cancer  STAGE:  II       ;GOALS: Curative  CURRENT/MOST RECENT THERAPY- Gem-Cap [June 3rd]          Malignant tumor of body of pancreas ([place])   10/11/2017 -  Chemotherapy    The patient had gemcitabine (GEMZAR) 1,600 mg in sodium chloride 0.9 % 250 mL chemo infusion, 1,672 mg, Intravenous,  Once, 3 of 6 cycles Dose modification: 900 mg/m2 (original dose 1,000 mg/m2, Cycle 3, Reason: Provider Judgment) Administration: 1,600 mg (10/24/2017), 1,600 mg (11/07/2017), 1,600 mg (11/21/2017), 1,600 mg (11/28/2017), 1,400 mg (12/26/2017), 1,400 mg (01/02/2018), 1,400 mg (01/09/2018)  for chemotherapy treatment.        INTERVAL HISTORY:  Nathaniel Saunders 47 y.o.  male pleasant  patient above history of stage II pancreatic cancer currently on adjuvant Xeloda-gemcitabine is here for follow-up  Patient denies any unusual aches and pains.  No nausea no vomiting.  No fevers or chills.  Review of Systems  Constitutional: Negative for chills, diaphoresis, fever, malaise/fatigue and weight loss.  HENT: Negative for nosebleeds and sore throat.   Eyes: Negative for double vision.  Respiratory: Negative for cough, hemoptysis, sputum production, shortness of breath and wheezing.   Cardiovascular: Negative for chest pain, palpitations, orthopnea and leg swelling.  Gastrointestinal: Negative for abdominal pain, blood in stool, constipation, diarrhea, heartburn, melena, nausea and vomiting.  Genitourinary: Negative for dysuria, frequency and urgency.  Musculoskeletal: Negative for back pain and joint pain.  Skin: Negative.  Negative for itching and rash.  Neurological: Negative for dizziness, tingling, focal weakness, weakness and headaches.  Endo/Heme/Allergies: Does not bruise/bleed easily.  Psychiatric/Behavioral: Negative for depression. The patient is not nervous/anxious and does not have insomnia.       PAST MEDICAL HISTORY :  Past Medical History:  Diagnosis Date  . Asthma   . Genetic testing 05/20/2017   Multi-Cancer panel (83 genes) @ [facility] - No pathogenic mutations detected  . Pancreatic cancer ([place])   . Pancreatic mass     PAST SURGICAL HISTORY :   Past Surgical History:  Procedure Laterality Date  . ERCP N/A 04/21/2017   Procedure: ENDOSCOPIC RETROGRADE CHOLANGIOPANCREATOGRAPHY (ERCP);  Surgeon: Lucilla Lame, MD;  Location: [facility] ENDOSCOPY;  Service: Endoscopy;  Laterality: N/A;  . ERCP N/A 05/06/2017   Procedure: ENDOSCOPIC RETROGRADE CHOLANGIOPANCREATOGRAPHY (ERCP);  Surgeon: Lucilla Lame, MD;  Location: [facility] ENDOSCOPY;  Service: Endoscopy;  Laterality: N/A;  . PANCREATECTOMY, PROXIMAL SUBTOTAL WITH TOTAL DUODENECTOMY, PARTIAL  GASTRECTOMY,  CHOLEDOCHOENTEROSTOMY AND GASTROJEJUNOSTOMY (WHIPPLE-TYPE PROCEDURE); WITHOUT PANCREATOJEJUNOSTOMY  09/06/2017  . PORTA CATH INSERTION N/A 04/18/2017   Procedure: PORTA CATH INSERTION;  Surgeon: Algernon Huxley, MD;  Location: [facility];  Service: Cardiovascular;  Laterality: N/A;    FAMILY HISTORY :   Family History  Problem Relation Age of Onset  . Pancreatic cancer Mother 62       deceased 61  . Diabetes Father   . Colon cancer Maternal Grandmother 70       deceased 25    SOCIAL HISTORY:   Social History   Tobacco Use  . Smoking status: Former Smoker    Packs/day: 0.25    Years: 5.00    Pack years: 1.25    Types: Cigarettes    Last attempt to quit: 05/07/2015    Years since quitting: 2.6  . Smokeless tobacco: Never Used  Substance Use Topics  . Alcohol use: No  . Drug use: No    ALLERGIES:  has No Known Allergies.  MEDICATIONS:  Current Outpatient Medications  Medication Sig Dispense Refill  . acetaminophen (TYLENOL) 325 MG tablet Take 2 tablets (650 mg total) by mouth every 6 (six) hours as needed for mild pain or fever (headache). 20 tablet 0  . albuterol (PROVENTIL HFA;VENTOLIN HFA) 108 (90 Base) MCG/ACT inhaler Inhale 1-2 puffs every 6 (six) hours as needed into the lungs for wheezing or shortness of breath.    . Ascorbic Acid (VITA-C PO) Take 1,000 mg by mouth daily.    . capecitabine (XELODA) 150 MG tablet Take 2 tablets (300 mg total) by mouth 2 (two) times daily after a meal. Take with two 561m tablets. Take for 3 weeks on, 1 week off. 84 tablet 5  . capecitabine (XELODA) 500 MG tablet Take 2 tablets (1,000 mg total) by mouth 2 (two) times daily after a meal. Take with two 1559mtablets. Take for 3 weeks on, 1 week off. 84 tablet 5  . ondansetron (ZOFRAN) 8 MG tablet One pill every 8 hours as needed for nausea/vomitting. 30 tablet 0  . pantoprazole (PROTONIX) 40 MG tablet Take 1 tablet by mouth daily.    . traMADol (ULTRAM) 50 MG tablet Take 1 tablet  (50 mg total) by mouth every 8 (eight) hours as needed. 30 tablet 0   No current facility-administered medications for this visit.    Facility-Administered Medications Ordered in Other Visits  Medication Dose Route Frequency Provider Last Rate Last Dose  . sodium chloride flush (NS) 0.9 % injection 10 mL  10 mL Intravenous PRN BrCammie SickleMD   10 mL at 06/15/17 0941    PHYSICAL EXAMINATION: ECOG PERFORMANCE STATUS: 0 - Asymptomatic  BP 113/77 (BP Location: Left Arm, Patient Position: Sitting)   Pulse (!) 55   Temp (!) 97.1 F (36.2 C) (Tympanic)   Resp 16   Wt 140 lb (63.5 kg)   BMI 24.80 kg/m   Filed Weights   12/26/17 0934  Weight: 140 lb (63.5 kg)    GENERAL: Well-nourished well-developed; Alert, no distress and comfortable.  Alone. EYES: no pallor or icterus OROPHARYNX: no thrush or ulceration; NECK: supple; no lymph nodes felt. LYMPH:  no palpable lymphadenopathy in the axillary or inguinal regions LUNGS: Decreased breath sounds auscultation bilaterally. No wheeze or crackles HEART/CVS: regular rate & rhythm and no murmurs; No lower extremity edema ABDOMEN:abdomen soft, non-tender and normal bowel sounds. No hepatomegaly or splenomegaly.  Musculoskeletal:no cyanosis of digits and no clubbing  PSYCH:  alert & oriented x 3 with fluent speech NEURO: no focal motor/sensory deficits SKIN:  no rashes or significant lesions    LABORATORY DATA:  I have reviewed the data as listed    Component Value Date/Time   NA 140 01/09/2018 0918   K 3.3 (L) 01/09/2018 0918   CL 107 01/09/2018 0918   CO2 25 01/09/2018 0918   GLUCOSE 129 (H) 01/09/2018 0918   BUN 7 01/09/2018 0918   CREATININE 0.82 01/09/2018 0918   CALCIUM 9.0 01/09/2018 0918   PROT 7.1 01/09/2018 0918   ALBUMIN 3.8 01/09/2018 0918   AST 20 01/09/2018 0918   ALT 15 01/09/2018 0918   ALKPHOS 64 01/09/2018 0918   BILITOT 0.7 01/09/2018 0918   GFRNONAA >60 01/09/2018 0918   GFRAA >60 01/09/2018 0918     No results found for: SPEP, UPEP  Lab Results  Component Value Date   WBC 3.5 (L) 01/09/2018   NEUTROABS 1.5 01/09/2018   HGB 11.0 (L) 01/09/2018   HCT 31.9 (L) 01/09/2018   MCV 97.7 01/09/2018   PLT 77 (L) 01/09/2018      Chemistry      Component Value Date/Time   NA 140 01/09/2018 0918   K 3.3 (L) 01/09/2018 0918   CL 107 01/09/2018 0918   CO2 25 01/09/2018 0918   BUN 7 01/09/2018 0918   CREATININE 0.82 01/09/2018 0918      Component Value Date/Time   CALCIUM 9.0 01/09/2018 0918   ALKPHOS 64 01/09/2018 0918   AST 20 01/09/2018 0918   ALT 15 01/09/2018 0918   BILITOT 0.7 01/09/2018 0918       RADIOGRAPHIC STUDIES: I have personally reviewed the radiological images as listed and agreed with the findings in the report. No results found.   ASSESSMENT & PLAN:  Malignant tumor of body of pancreas ([place]) #Pancreatic adenocarcinoma stage II s/p Whipple's. Currently on adjuvant chemotherapy with gemcitabine-Xeloda. STABLE.   # Proceed with chemo today; Labs today reviewed;  acceptable for treatment today.    # Pain control-better controlled; stable  # Foul smelling stool- ? Malabsorption;  improved with dietary discretion.   # chemo today; in 1 week/cbc; follow up with me in 2 week/labs-Gem/MD.    No orders of the defined types were placed in this encounter.  All questions were answered. The patient knows to call the clinic with any problems, questions or concerns.      Cammie Sickle, MD 01/16/2018 7:39 PM

## 2017-12-26 NOTE — Assessment & Plan Note (Addendum)
#  Pancreatic adenocarcinoma stage II s/p Whipple's. Currently on adjuvant chemotherapy with gemcitabine-Xeloda. STABLE.   # Proceed with chemo today; Labs today reviewed;  acceptable for treatment today.    # Pain control-better controlled; stable  # Foul smelling stool- ? Malabsorption;  improved with dietary discretion.   # chemo today; in 1 week/cbc; follow up with me in 2 week/labs-Gem/MD.

## 2018-01-02 ENCOUNTER — Inpatient Hospital Stay: Payer: Medicaid Other

## 2018-01-02 VITALS — BP 117/71 | HR 64 | Temp 98.1°F | Resp 18 | Wt 139.0 lb

## 2018-01-02 DIAGNOSIS — C251 Malignant neoplasm of body of pancreas: Secondary | ICD-10-CM

## 2018-01-02 DIAGNOSIS — Z5111 Encounter for antineoplastic chemotherapy: Secondary | ICD-10-CM | POA: Diagnosis not present

## 2018-01-02 LAB — CBC WITH DIFFERENTIAL/PLATELET
Basophils Absolute: 0 10*3/uL (ref 0–0.1)
Basophils Relative: 1 %
Eosinophils Absolute: 0 10*3/uL (ref 0–0.7)
Eosinophils Relative: 1 %
HCT: 32.4 % — ABNORMAL LOW (ref 40.0–52.0)
Hemoglobin: 11.3 g/dL — ABNORMAL LOW (ref 13.0–18.0)
Lymphocytes Relative: 48 %
Lymphs Abs: 1.6 10*3/uL (ref 1.0–3.6)
MCH: 33.5 pg (ref 26.0–34.0)
MCHC: 34.7 g/dL (ref 32.0–36.0)
MCV: 96.3 fL (ref 80.0–100.0)
Monocytes Absolute: 0.2 10*3/uL (ref 0.2–1.0)
Monocytes Relative: 7 %
Neutro Abs: 1.5 10*3/uL (ref 1.4–6.5)
Neutrophils Relative %: 43 %
Platelets: 136 10*3/uL — ABNORMAL LOW (ref 150–440)
RBC: 3.37 MIL/uL — ABNORMAL LOW (ref 4.40–5.90)
RDW: 21.8 % — ABNORMAL HIGH (ref 11.5–14.5)
WBC: 3.4 10*3/uL — ABNORMAL LOW (ref 3.8–10.6)

## 2018-01-02 LAB — COMPREHENSIVE METABOLIC PANEL
ALT: 17 U/L (ref 0–44)
AST: 22 U/L (ref 15–41)
Albumin: 3.7 g/dL (ref 3.5–5.0)
Alkaline Phosphatase: 61 U/L (ref 38–126)
Anion gap: 7 (ref 5–15)
BILIRUBIN TOTAL: 0.8 mg/dL (ref 0.3–1.2)
BUN: 6 mg/dL (ref 6–20)
CALCIUM: 8.9 mg/dL (ref 8.9–10.3)
CHLORIDE: 108 mmol/L (ref 98–111)
CO2: 26 mmol/L (ref 22–32)
Creatinine, Ser: 0.78 mg/dL (ref 0.61–1.24)
GFR calc Af Amer: >60 mL/min (ref >60)
GLUCOSE: 112 mg/dL — AB (ref 70–99)
Potassium: 3.4 mmol/L — ABNORMAL LOW (ref 3.5–5.1)
Sodium: 141 mmol/L (ref 135–145)
Total Protein: 6.9 g/dL (ref 6.5–8.1)

## 2018-01-02 MED ORDER — SODIUM CHLORIDE 0.9 % IV SOLN
1400.0000 mg | Freq: Once | INTRAVENOUS | Status: AC
  Administered 2018-01-02: 1400 mg via INTRAVENOUS
  Filled 2018-01-02: qty 26.3

## 2018-01-02 MED ORDER — PROCHLORPERAZINE MALEATE 10 MG PO TABS
10.0000 mg | ORAL_TABLET | Freq: Once | ORAL | Status: AC
  Administered 2018-01-02: 10 mg via ORAL
  Filled 2018-01-02: qty 1

## 2018-01-02 MED ORDER — HEPARIN SOD (PORK) LOCK FLUSH 100 UNIT/ML IV SOLN
500.0000 [IU] | Freq: Once | INTRAVENOUS | Status: AC
  Administered 2018-01-02: 500 [IU] via INTRAVENOUS
  Filled 2018-01-02: qty 5

## 2018-01-02 MED ORDER — SODIUM CHLORIDE 0.9 % IV SOLN
Freq: Once | INTRAVENOUS | Status: AC
  Administered 2018-01-02: 09:00:00 via INTRAVENOUS
  Filled 2018-01-02: qty 1000

## 2018-01-02 MED ORDER — HEPARIN SOD (PORK) LOCK FLUSH 100 UNIT/ML IV SOLN: 500.0000 [IU] | Freq: Once | INTRAVENOUS | Status: DC | PRN

## 2018-01-02 MED ORDER — SODIUM CHLORIDE 0.9% FLUSH
10.0000 mL | Freq: Once | INTRAVENOUS | Status: AC
  Administered 2018-01-02: 10 mL via INTRAVENOUS
  Filled 2018-01-02: qty 10

## 2018-01-02 MED ORDER — SODIUM CHLORIDE 0.9% FLUSH
10.0000 mL | INTRAVENOUS | Status: DC | PRN
  Filled 2018-01-02: qty 10

## 2018-01-02 NOTE — Progress Notes (Signed)
potassium 3.4. Pt states states he is eating good, and no diarrhea. MD aware, pt educated to increase potassium intake in diet, pt verbalizes understanding. NNO at this time.

## 2018-01-05 ENCOUNTER — Telehealth: Payer: Self-pay | Admitting: Pharmacist

## 2018-01-06 NOTE — Telephone Encounter (Signed)
Oral Chemotherapy Pharmacist Encounter   Attempted to reach patient for follow up on oral medication: Xeloda (capecitabine). No answer. Left VM for patient to call back.    Darl Pikes, PharmD, BCPS, [facility] Hematology/Oncology Clinical Pharmacist [facility] [phone]  01/06/2018 8:29 AM

## 2018-01-09 ENCOUNTER — Inpatient Hospital Stay (HOSPITAL_BASED_OUTPATIENT_CLINIC_OR_DEPARTMENT_OTHER): Payer: Medicaid Other | Admitting: Internal Medicine

## 2018-01-09 ENCOUNTER — Encounter: Payer: Self-pay | Admitting: Internal Medicine

## 2018-01-09 ENCOUNTER — Other Ambulatory Visit: Payer: Self-pay

## 2018-01-09 ENCOUNTER — Inpatient Hospital Stay: Payer: Medicaid Other

## 2018-01-09 ENCOUNTER — Telehealth: Payer: Self-pay | Admitting: Pharmacist

## 2018-01-09 VITALS — BP 121/78 | HR 56 | Temp 97.9°F | Resp 18 | Ht 63.0 in | Wt 137.0 lb

## 2018-01-09 DIAGNOSIS — C251 Malignant neoplasm of body of pancreas: Secondary | ICD-10-CM

## 2018-01-09 DIAGNOSIS — J45909 Unspecified asthma, uncomplicated: Secondary | ICD-10-CM

## 2018-01-09 DIAGNOSIS — R197 Diarrhea, unspecified: Secondary | ICD-10-CM

## 2018-01-09 DIAGNOSIS — K909 Intestinal malabsorption, unspecified: Secondary | ICD-10-CM

## 2018-01-09 DIAGNOSIS — D696 Thrombocytopenia, unspecified: Secondary | ICD-10-CM | POA: Diagnosis not present

## 2018-01-09 DIAGNOSIS — Z803 Family history of malignant neoplasm of breast: Secondary | ICD-10-CM

## 2018-01-09 DIAGNOSIS — Z5111 Encounter for antineoplastic chemotherapy: Secondary | ICD-10-CM

## 2018-01-09 DIAGNOSIS — Z87891 Personal history of nicotine dependence: Secondary | ICD-10-CM

## 2018-01-09 DIAGNOSIS — Z79899 Other long term (current) drug therapy: Secondary | ICD-10-CM

## 2018-01-09 DIAGNOSIS — Z800 Family history of malignant neoplasm of digestive organs: Secondary | ICD-10-CM

## 2018-01-09 LAB — COMPREHENSIVE METABOLIC PANEL
ALT: 15 U/L (ref 0–44)
ANION GAP: 8 (ref 5–15)
AST: 20 U/L (ref 15–41)
Albumin: 3.8 g/dL (ref 3.5–5.0)
Alkaline Phosphatase: 64 U/L (ref 38–126)
BILIRUBIN TOTAL: 0.7 mg/dL (ref 0.3–1.2)
BUN: 7 mg/dL (ref 6–20)
CALCIUM: 9 mg/dL (ref 8.9–10.3)
CO2: 25 mmol/L (ref 22–32)
CREATININE: 0.82 mg/dL (ref 0.61–1.24)
Chloride: 107 mmol/L (ref 98–111)
Glucose, Bld: 129 mg/dL — ABNORMAL HIGH (ref 70–99)
Potassium: 3.3 mmol/L — ABNORMAL LOW (ref 3.5–5.1)
SODIUM: 140 mmol/L (ref 135–145)
TOTAL PROTEIN: 7.1 g/dL (ref 6.5–8.1)

## 2018-01-09 LAB — CBC WITH DIFFERENTIAL/PLATELET
BASOS ABS: 0 10*3/uL (ref 0–0.1)
BASOS PCT: 1 %
EOS ABS: 0 10*3/uL (ref 0–0.7)
Eosinophils Relative: 1 %
HCT: 31.9 % — ABNORMAL LOW (ref 40.0–52.0)
HEMOGLOBIN: 11 g/dL — AB (ref 13.0–18.0)
Lymphocytes Relative: 52 %
Lymphs Abs: 1.8 10*3/uL (ref 1.0–3.6)
MCH: 33.8 pg (ref 26.0–34.0)
MCHC: 34.6 g/dL (ref 32.0–36.0)
MCV: 97.7 fL (ref 80.0–100.0)
MONOS PCT: 4 %
Monocytes Absolute: 0.1 10*3/uL — ABNORMAL LOW (ref 0.2–1.0)
NEUTROS ABS: 1.5 10*3/uL (ref 1.4–6.5)
NEUTROS PCT: 42 %
Platelets: 77 10*3/uL — ABNORMAL LOW (ref 150–440)
RBC: 3.26 MIL/uL — AB (ref 4.40–5.90)
RDW: 22.1 % — ABNORMAL HIGH (ref 11.5–14.5)
WBC: 3.5 10*3/uL — AB (ref 3.8–10.6)

## 2018-01-09 MED ORDER — SODIUM CHLORIDE 0.9 % IV SOLN
Freq: Once | INTRAVENOUS | Status: AC
  Administered 2018-01-09: 10:00:00 via INTRAVENOUS
  Filled 2018-01-09: qty 1000

## 2018-01-09 MED ORDER — HEPARIN SOD (PORK) LOCK FLUSH 100 UNIT/ML IV SOLN
500.0000 [IU] | Freq: Once | INTRAVENOUS | Status: AC
  Administered 2018-01-09: 500 [IU] via INTRAVENOUS
  Filled 2018-01-09: qty 5

## 2018-01-09 MED ORDER — SODIUM CHLORIDE 0.9% FLUSH
10.0000 mL | Freq: Once | INTRAVENOUS | Status: AC
  Administered 2018-01-09: 10 mL via INTRAVENOUS
  Filled 2018-01-09: qty 10

## 2018-01-09 MED ORDER — SODIUM CHLORIDE 0.9% FLUSH
10.0000 mL | INTRAVENOUS | Status: DC | PRN
  Filled 2018-01-09: qty 10

## 2018-01-09 MED ORDER — HEPARIN SOD (PORK) LOCK FLUSH 100 UNIT/ML IV SOLN: 500.0000 [IU] | Freq: Once | INTRAVENOUS | Status: DC | PRN

## 2018-01-09 MED ORDER — PROCHLORPERAZINE MALEATE 10 MG PO TABS
10.0000 mg | ORAL_TABLET | Freq: Once | ORAL | Status: AC
  Administered 2018-01-09: 10 mg via ORAL
  Filled 2018-01-09: qty 1

## 2018-01-09 MED ORDER — SODIUM CHLORIDE 0.9 % IV SOLN
1400.0000 mg | Freq: Once | INTRAVENOUS | Status: AC
  Administered 2018-01-09: 1400 mg via INTRAVENOUS
  Filled 2018-01-09: qty 37
  Filled 2018-01-09: qty 26.3

## 2018-01-09 NOTE — Telephone Encounter (Addendum)
Oral Chemotherapy Pharmacist Encounter   There was some concern from the pharmacy that Mr. Ealy was nonadherent with his Xeloda regimen. Met with Mr. Crisanto in infusion following his OV. I provided Mr. Caylor with a patient calendar tailored to his medication schedule and treatment cycle. I also discussed the current method Mr. Rappaport uses to keep track of his medication schedule/taking his medication. I suggested that he keep his medication calendar by his medication bottle and keep both near where in eats his meals as the medication is to be taken with food. Also suggested that he cross off the doses and days on the calendar as he takes his medication to help him keep track. The calendar will cover him until the end of October, additional months can be provided if needed.  Mr. Bruhl accepted my suggestions. He stated that his reason for missing doses was his not eating. Discussed the importance of the Xeloda in his treatment and ways to increase his deit so he is able to take his Xeloda with food as instructed and not miss doses. He also stated that he was experiencing some diarrhea and nausea with the Xeloda, discussed management of his side effects.  I will follow-up with Mr. Migliaccio at the end of his next cycle to see if he is still missing doses.   Darl Pikes, PharmD, BCPS, [facility] Hematology/Oncology Clinical Pharmacist [facility] [phone]  01/09/2018 1:13 PM

## 2018-01-09 NOTE — Telephone Encounter (Signed)
Oral Chemotherapy Pharmacist Encounter  Follow-Up Form  Called patient today to follow up regarding patient's oral chemotherapy medication: Xeloda (capecitabine)  Original Start date of oral chemotherapy: 09/2017  Pt reports 4-5 doses of Xarelto missed so far this cycle. See adherence encounter from 01/09/18  Pt reports the following side effects: diarrhea and nausea, discussed management with pt  New medications?: None reported  Other Issues: None reported  Patient knows to call the office with questions or concerns. Oral Oncology Clinic will continue to follow.  Darl Pikes, PharmD, BCPS, [facility] Hematology/Oncology Clinical Pharmacist [facility] [phone]  01/09/2018 3:07 PM

## 2018-01-09 NOTE — Assessment & Plan Note (Addendum)
#   Pancreatic adenocarcinoma stage II s/p Whipple's. Currently on adjuvant chemotherapy with gemcitabine-Xeloda. STABLE.   # Proceed with chemo today; Labs today reviewed;  acceptable for treatment today; except for platelets- 73.  Again reviewed compliance with Xeloda.  # Thrombocytopenia- new; from gem- okay to proceed with treatment today.  Monitor closely.  # Chemo today; ; follow up with me in 2 week/labs-Gem/MD; gem/cbcin 3 weeks.

## 2018-01-09 NOTE — Progress Notes (Signed)
[place] OFFICE PROGRESS NOTE  Patient Care Team: Maeola Sarah, [name], 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; [name]; [name]; [name]; [name]; [name];   # Stomach- H.Pylori [[facility]] S/p prevpack [may 2019]   ---------------------------------------------------    # Nov 2018-biliary obstruction status post ERCP; [name]; [name]; [name]; [name], [name], [name], and PALB2 c.1655A>G (p.Gln552Arg. This is still considered a normal result.  # Molecular testing Not done -------------------------------------------------   DIAGNOSIS: [ ]  Pancreatic cancer  STAGE:  II       ;GOALS: Curative  CURRENT/MOST RECENT THERAPY- Gem-Cap [June 3rd]          Malignant tumor of body of pancreas ([place])   10/11/2017 -  Chemotherapy    The patient had gemcitabine (GEMZAR) 1,600 mg in sodium chloride 0.9 % 250 mL chemo infusion, 1,672 mg, Intravenous,  Once, 3 of 6 cycles Dose modification: 900 mg/m2 (original dose 1,000 mg/m2, Cycle 3, Reason: Provider Judgment) Administration: 1,600 mg (10/24/2017), 1,600 mg (11/07/2017), 1,600 mg (11/21/2017), 1,600 mg (11/28/2017), 1,400 mg (12/26/2017), 1,400 mg (01/02/2018), 1,400 mg (01/09/2018)  for chemotherapy treatment.        INTERVAL HISTORY:  Nathaniel Saunders 47 y.o.  male pleasant  patient above history of stage II pancreatic cancer status post Whipple's is currently on adjuvant gemcitabine-Xeloda is here for follow-up.  Patient admits to mild diarrhea 1 loose stool a day.  Otherwise denies any skin rash.  Denies any bleeding.  Review of Systems  Constitutional: Negative for chills, diaphoresis, fever, malaise/fatigue and weight loss.  HENT: Negative for nosebleeds and sore throat.   Eyes: Negative for double vision.  Respiratory: Negative for cough, hemoptysis, sputum production, shortness of breath and wheezing.   Cardiovascular: Negative for chest pain, palpitations, orthopnea and leg swelling.  Gastrointestinal: Positive for diarrhea. Negative for abdominal pain, blood in stool, constipation, heartburn, melena, nausea and vomiting.  Genitourinary: Negative for dysuria, frequency and urgency.  Musculoskeletal: Negative for back pain and joint pain.  Skin: Negative.  Negative for itching and rash.  Neurological: Negative for dizziness, tingling, focal weakness, weakness and headaches.  Endo/Heme/Allergies: Does not bruise/bleed easily.  Psychiatric/Behavioral: Negative for depression. The patient is not nervous/anxious and does not have insomnia.       PAST MEDICAL HISTORY :  Past Medical History:  Diagnosis Date  . Asthma   . Genetic testing 05/20/2017   Multi-Cancer panel (83 genes) @ [facility] - No pathogenic mutations detected  . Pancreatic cancer ([place])   . Pancreatic mass     PAST SURGICAL HISTORY :   Past Surgical History:  Procedure Laterality Date  . ERCP N/A 04/21/2017   Procedure: ENDOSCOPIC RETROGRADE CHOLANGIOPANCREATOGRAPHY (ERCP);  Surgeon: Lucilla Lame, MD;  Location: [facility] ENDOSCOPY;  Service: Endoscopy;  Laterality: N/A;  . ERCP N/A 05/06/2017   Procedure: ENDOSCOPIC RETROGRADE CHOLANGIOPANCREATOGRAPHY (ERCP);  Surgeon: Lucilla Lame, MD;  Location: [facility] ENDOSCOPY;  Service: Endoscopy;  Laterality: N/A;  .  PANCREATECTOMY, PROXIMAL SUBTOTAL  WITH TOTAL DUODENECTOMY, PARTIAL GASTRECTOMY, CHOLEDOCHOENTEROSTOMY AND GASTROJEJUNOSTOMY (WHIPPLE-TYPE PROCEDURE); WITHOUT PANCREATOJEJUNOSTOMY  09/06/2017  . PORTA CATH INSERTION N/A 04/18/2017   Procedure: PORTA CATH INSERTION;  Surgeon: Algernon Huxley, MD;  Location: [place] CV LAB;  Service: Cardiovascular;  Laterality: N/A;    FAMILY HISTORY :   Family History  Problem Relation Age of Onset  . Pancreatic cancer Mother 19       deceased 23  . Diabetes Father   . Colon cancer Maternal Grandmother 70       deceased 50    SOCIAL HISTORY:   Social History   Tobacco Use  . Smoking status: Former Smoker    Packs/day: 0.25    Years: 5.00    Pack years: 1.25    Types: Cigarettes    Last attempt to quit: 05/07/2015    Years since quitting: 2.6  . Smokeless tobacco: Never Used  Substance Use Topics  . Alcohol use: No  . Drug use: No    ALLERGIES:  has No Known Allergies.  MEDICATIONS:  Current Outpatient Medications  Medication Sig Dispense Refill  . acetaminophen (TYLENOL) 325 MG tablet Take 2 tablets (650 mg total) by mouth every 6 (six) hours as needed for mild pain or fever (headache). 20 tablet 0  . albuterol (PROVENTIL HFA;VENTOLIN HFA) 108 (90 Base) MCG/ACT inhaler Inhale 1-2 puffs every 6 (six) hours as needed into the lungs for wheezing or shortness of breath.    . Ascorbic Acid (VITA-C PO) Take 1,000 mg by mouth daily.    . capecitabine (XELODA) 150 MG tablet Take 2 tablets (300 mg total) by mouth 2 (two) times daily after a meal. Take with two 565m tablets. Take for 3 weeks on, 1 week off. 84 tablet 5  . capecitabine (XELODA) 500 MG tablet Take 2 tablets (1,000 mg total) by mouth 2 (two) times daily after a meal. Take with two 1511mtablets. Take for 3 weeks on, 1 week off. 84 tablet 5  . ondansetron (ZOFRAN) 8 MG tablet One pill every 8 hours as needed for nausea/vomitting. 30 tablet 0  . pantoprazole (PROTONIX) 40 MG tablet Take 1 tablet by mouth daily.    .  traMADol (ULTRAM) 50 MG tablet Take 1 tablet (50 mg total) by mouth every 8 (eight) hours as needed. 30 tablet 0   No current facility-administered medications for this visit.    Facility-Administered Medications Ordered in Other Visits  Medication Dose Route Frequency Provider Last Rate Last Dose  . sodium chloride flush (NS) 0.9 % injection 10 mL  10 mL Intravenous PRN BrCammie SickleMD   10 mL at 06/15/17 0941    PHYSICAL EXAMINATION: ECOG PERFORMANCE STATUS: 0 - Asymptomatic  BP 121/78 (BP Location: Left Arm, Patient Position: Sitting)   Pulse (!) 56   Temp 97.9 F (36.6 C) (Tympanic)   Resp 18   Ht 5' 3"  (1.6 m)   Wt 137 lb (62.1 kg)   BMI 24.27 kg/m   Filed Weights   01/09/18 0935  Weight: 137 lb (62.1 kg)    GENERAL: Well-nourished well-developed; Alert, no distress and comfortable.  Alone. EYES: no pallor or icterus OROPHARYNX: no thrush or ulceration; NECK: supple; no lymph nodes felt. LYMPH:  no palpable lymphadenopathy in the axillary or inguinal regions LUNGS: Decreased breath sounds auscultation bilaterally. No wheeze or crackles HEART/CVS: regular rate & rhythm and no murmurs; No lower extremity edema ABDOMEN:abdomen soft, non-tender and normal bowel sounds. No  hepatomegaly or splenomegaly.  Musculoskeletal:no cyanosis of digits and no clubbing  PSYCH: alert & oriented x 3 with fluent speech NEURO: no focal motor/sensory deficits SKIN:  no rashes or significant lesions    LABORATORY DATA:  I have reviewed the data as listed    Component Value Date/Time   NA 140 01/09/2018 0918   K 3.3 (L) 01/09/2018 0918   CL 107 01/09/2018 0918   CO2 25 01/09/2018 0918   GLUCOSE 129 (H) 01/09/2018 0918   BUN 7 01/09/2018 0918   CREATININE 0.82 01/09/2018 0918   CALCIUM 9.0 01/09/2018 0918   PROT 7.1 01/09/2018 0918   ALBUMIN 3.8 01/09/2018 0918   AST 20 01/09/2018 0918   ALT 15 01/09/2018 0918   ALKPHOS 64 01/09/2018 0918   BILITOT 0.7 01/09/2018 0918    GFRNONAA >60 01/09/2018 0918   GFRAA >60 01/09/2018 0918    No results found for: SPEP, UPEP  Lab Results  Component Value Date   WBC 3.5 (L) 01/09/2018   NEUTROABS 1.5 01/09/2018   HGB 11.0 (L) 01/09/2018   HCT 31.9 (L) 01/09/2018   MCV 97.7 01/09/2018   PLT 77 (L) 01/09/2018      Chemistry      Component Value Date/Time   NA 140 01/09/2018 0918   K 3.3 (L) 01/09/2018 0918   CL 107 01/09/2018 0918   CO2 25 01/09/2018 0918   BUN 7 01/09/2018 0918   CREATININE 0.82 01/09/2018 0918      Component Value Date/Time   CALCIUM 9.0 01/09/2018 0918   ALKPHOS 64 01/09/2018 0918   AST 20 01/09/2018 0918   ALT 15 01/09/2018 0918   BILITOT 0.7 01/09/2018 0918       RADIOGRAPHIC STUDIES: I have personally reviewed the radiological images as listed and agreed with the findings in the report. No results found.   ASSESSMENT & PLAN:  Malignant tumor of body of pancreas ([place]) # Pancreatic adenocarcinoma stage II s/p Whipple's. Currently on adjuvant chemotherapy with gemcitabine-Xeloda. STABLE.   # Proceed with chemo today; Labs today reviewed;  acceptable for treatment today; except for platelets- 73.  Again reviewed compliance with Xeloda.  # Thrombocytopenia- new; from gem- okay to proceed with treatment today.  Monitor closely.  # Chemo today; ; follow up with me in 2 week/labs-Gem/MD; gem/cbcin 3 weeks.    Orders Placed This Encounter  Procedures  . CBC with Differential    Standing Status:   Future    Standing Expiration Date:   01/10/2019  . Comprehensive metabolic panel    Standing Status:   Future    Standing Expiration Date:   01/10/2019  . CBC with Differential    Standing Status:   Future    Standing Expiration Date:   01/09/2019   All questions were answered. The patient knows to call the clinic with any problems, questions or concerns.      Cammie Sickle, MD 01/10/2018 7:47 AM

## 2018-01-24 ENCOUNTER — Encounter: Payer: Self-pay | Admitting: Internal Medicine

## 2018-01-24 ENCOUNTER — Other Ambulatory Visit: Payer: Self-pay

## 2018-01-24 ENCOUNTER — Inpatient Hospital Stay: Payer: Medicaid Other | Attending: Internal Medicine

## 2018-01-24 ENCOUNTER — Inpatient Hospital Stay: Payer: Medicaid Other

## 2018-01-24 ENCOUNTER — Inpatient Hospital Stay (HOSPITAL_BASED_OUTPATIENT_CLINIC_OR_DEPARTMENT_OTHER): Payer: Medicaid Other | Admitting: Internal Medicine

## 2018-01-24 VITALS — BP 99/66 | HR 86 | Temp 96.7°F | Resp 18 | Ht 63.0 in | Wt 136.7 lb

## 2018-01-24 VITALS — BP 118/73 | HR 57 | Temp 96.0°F | Resp 18

## 2018-01-24 DIAGNOSIS — C251 Malignant neoplasm of body of pancreas: Secondary | ICD-10-CM | POA: Insufficient documentation

## 2018-01-24 DIAGNOSIS — Z87891 Personal history of nicotine dependence: Secondary | ICD-10-CM

## 2018-01-24 DIAGNOSIS — D701 Agranulocytosis secondary to cancer chemotherapy: Secondary | ICD-10-CM

## 2018-01-24 DIAGNOSIS — Z5111 Encounter for antineoplastic chemotherapy: Secondary | ICD-10-CM | POA: Diagnosis not present

## 2018-01-24 LAB — CBC WITH DIFFERENTIAL/PLATELET
BASOS PCT: 2 %
Basophils Absolute: 0.1 10*3/uL (ref 0–0.1)
EOS ABS: 0.1 10*3/uL (ref 0–0.7)
EOS PCT: 3 %
HCT: 35.1 % — ABNORMAL LOW (ref 40.0–52.0)
HEMOGLOBIN: 11.8 g/dL — AB (ref 13.0–18.0)
Lymphocytes Relative: 38 %
Lymphs Abs: 1.4 10*3/uL (ref 1.0–3.6)
MCH: 33.9 pg (ref 26.0–34.0)
MCHC: 33.6 g/dL (ref 32.0–36.0)
MCV: 100.7 fL — ABNORMAL HIGH (ref 80.0–100.0)
Monocytes Absolute: 0.5 10*3/uL (ref 0.2–1.0)
Monocytes Relative: 14 %
NEUTROS PCT: 43 %
Neutro Abs: 1.6 10*3/uL (ref 1.4–6.5)
PLATELETS: 427 10*3/uL (ref 150–440)
RBC: 3.49 MIL/uL — ABNORMAL LOW (ref 4.40–5.90)
RDW: 22.4 % — AB (ref 11.5–14.5)
WBC: 3.6 10*3/uL — AB (ref 3.8–10.6)

## 2018-01-24 LAB — COMPREHENSIVE METABOLIC PANEL
ALK PHOS: 64 U/L (ref 38–126)
ALT: 27 U/L (ref 0–44)
AST: 34 U/L (ref 15–41)
Albumin: 4 g/dL (ref 3.5–5.0)
Anion gap: 8 (ref 5–15)
BUN: 6 mg/dL (ref 6–20)
CALCIUM: 9 mg/dL (ref 8.9–10.3)
CO2: 26 mmol/L (ref 22–32)
CREATININE: 0.89 mg/dL (ref 0.61–1.24)
Chloride: 105 mmol/L (ref 98–111)
Glucose, Bld: 138 mg/dL — ABNORMAL HIGH (ref 70–99)
Potassium: 3.8 mmol/L (ref 3.5–5.1)
Sodium: 139 mmol/L (ref 135–145)
Total Bilirubin: 1.1 mg/dL (ref 0.3–1.2)
Total Protein: 7.5 g/dL (ref 6.5–8.1)

## 2018-01-24 MED ORDER — SODIUM CHLORIDE 0.9 % IV SOLN: 1400.0000 mg | Freq: Once | INTRAVENOUS | Status: DC

## 2018-01-24 MED ORDER — SODIUM CHLORIDE 0.9 % IV SOLN
Freq: Once | INTRAVENOUS | Status: AC
  Administered 2018-01-24: 10:00:00 via INTRAVENOUS
  Filled 2018-01-24: qty 250

## 2018-01-24 MED ORDER — HEPARIN SOD (PORK) LOCK FLUSH 100 UNIT/ML IV SOLN
500.0000 [IU] | Freq: Once | INTRAVENOUS | Status: AC
  Administered 2018-01-24: 500 [IU] via INTRAVENOUS

## 2018-01-24 MED ORDER — PROCHLORPERAZINE MALEATE 10 MG PO TABS
10.0000 mg | ORAL_TABLET | Freq: Once | ORAL | Status: AC
  Administered 2018-01-24: 10 mg via ORAL
  Filled 2018-01-24: qty 1

## 2018-01-24 MED ORDER — SODIUM CHLORIDE 0.9% FLUSH
10.0000 mL | INTRAVENOUS | Status: DC | PRN
  Administered 2018-01-24: 10 mL via INTRAVENOUS
  Filled 2018-01-24: qty 10

## 2018-01-24 MED ORDER — SODIUM CHLORIDE 0.9 % IV SOLN
1300.0000 mg | Freq: Once | INTRAVENOUS | Status: AC
  Administered 2018-01-24: 1300 mg via INTRAVENOUS
  Filled 2018-01-24 (×2): qty 34

## 2018-01-24 NOTE — Assessment & Plan Note (Addendum)
#   Pancreatic adenocarcinoma stage II s/p Whipple's. Currently on adjuvant chemotherapy with gemcitabine-Xeloda. STABLE.   #Cycle #4- day-1 today; Proceed with chemo today [we will decrease the dose of gemcitabine to 800 mg/m-given neutropenia/thrombocytopenia]; Labs today reviewed;  acceptable for treatment today.   # Thrombocytopenia-improved  #Diarrhea-improved.  # Chemo today;  follow up with me in 2 week/labs-Gem/MD; gem/cbcin 1 week

## 2018-01-24 NOTE — Progress Notes (Signed)
[place] OFFICE PROGRESS NOTE  Patient Care Team: Maeola Sarah, [name], 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; [name]; [name]; [name]; [name]; [name];   # Stomach- H.Pylori [[facility]] S/p prevpack [may 2019]   ---------------------------------------------------    # Nov 2018-biliary obstruction status post ERCP; [name]; [name]; [name]; [name], [name], [name], and PALB2 c.1655A>G (p.Gln552Arg. This is still considered a normal result.  # Molecular testing Not done -------------------------------------------------   DIAGNOSIS: _0  Pancreatic cancer  STAGE:  II       ;GOALS: Curative  CURRENT/MOST RECENT THERAPY- Gem-Cap [June 3rd]          Malignant tumor of body of pancreas ([place])   10/11/2017 -  Chemotherapy    The patient had gemcitabine (GEMZAR) 1,600 mg in sodium chloride 0.9 % 250 mL chemo infusion, 1,672 mg, Intravenous,  Once, 4 of 6 cycles Dose modification: 900 mg/m2 (original dose 1,000 mg/m2, Cycle 3, Reason: Provider Judgment), 800 mg/m2 (original dose 1,000 mg/m2, Cycle 4, Reason: Provider Judgment) Administration: 1,600 mg (10/24/2017), 1,600 mg (11/07/2017), 1,600 mg (11/21/2017), 1,600 mg (11/28/2017), 1,400 mg (12/26/2017), 1,400 mg (01/02/2018), 1,400 mg (01/09/2018)  for chemotherapy treatment.         INTERVAL HISTORY:  Nathaniel Saunders 47 y.o.  male pleasant patient above history of stage II pancreatic cancer status post Whipple's is currently on adjuvant gemcitabine-Xeloda is here for follow-up.  No diarrhea.  No skin rash.  No bleeding.  No nausea no vomiting.  Review of Systems  Constitutional: Negative for chills, diaphoresis, fever, malaise/fatigue and weight loss.  HENT: Negative for nosebleeds and sore throat.   Eyes: Negative for double vision.  Respiratory: Negative for cough, hemoptysis, sputum production, shortness of breath and wheezing.   Cardiovascular: Negative for chest pain, palpitations, orthopnea and leg swelling.  Gastrointestinal: Negative for abdominal pain, blood in stool, constipation, heartburn, melena, nausea and vomiting.  Genitourinary: Negative for dysuria, frequency and urgency.  Musculoskeletal: Negative for back pain and joint pain.  Skin: Negative.  Negative for itching and rash.  Neurological: Negative for dizziness, tingling, focal weakness, weakness and headaches.  Endo/Heme/Allergies: Does not bruise/bleed easily.  Psychiatric/Behavioral: Negative for depression. The patient is not nervous/anxious and does not have insomnia.       PAST MEDICAL HISTORY :  Past Medical History:  Diagnosis Date  . Asthma   . Genetic testing 05/20/2017   Multi-Cancer panel (83 genes) @ [facility] - No pathogenic mutations detected  . Pancreatic cancer ([place])   . Pancreatic mass     PAST SURGICAL HISTORY :   Past Surgical History:  Procedure Laterality Date  . ERCP N/A 04/21/2017   Procedure: ENDOSCOPIC RETROGRADE CHOLANGIOPANCREATOGRAPHY (ERCP);  Surgeon: Lucilla Lame, MD;  Location: [facility] ENDOSCOPY;  Service: Endoscopy;  Laterality: N/A;  . ERCP N/A 05/06/2017   Procedure: ENDOSCOPIC RETROGRADE CHOLANGIOPANCREATOGRAPHY (ERCP);  Surgeon: Lucilla Lame, MD;  Location: [facility] ENDOSCOPY;  Service: Endoscopy;  Laterality: N/A;  . PANCREATECTOMY, PROXIMAL  SUBTOTAL WITH TOTAL DUODENECTOMY, PARTIAL GASTRECTOMY, CHOLEDOCHOENTEROSTOMY AND GASTROJEJUNOSTOMY (WHIPPLE-TYPE PROCEDURE); WITHOUT PANCREATOJEJUNOSTOMY  09/06/2017  . PORTA CATH INSERTION N/A 04/18/2017   Procedure: PORTA CATH INSERTION;  Surgeon: Algernon Huxley, MD;  Location: [place] CV LAB;  Service: Cardiovascular;  Laterality: N/A;    FAMILY HISTORY :   Family History  Problem Relation Age of Onset  . Pancreatic cancer Mother 36       deceased 28  . Diabetes Father   . Colon cancer Maternal Grandmother 70       deceased 73    SOCIAL HISTORY:   Social History   Tobacco Use  . Smoking status: Former Smoker    Packs/day: 0.25    Years: 5.00    Pack years: 1.25    Types: Cigarettes    Last attempt to quit: 05/07/2015    Years since quitting: 2.7  . Smokeless tobacco: Never Used  Substance Use Topics  . Alcohol use: No  . Drug use: No    ALLERGIES:  has No Known Allergies.  MEDICATIONS:  Current Outpatient Medications  Medication Sig Dispense Refill  . albuterol (PROVENTIL HFA;VENTOLIN HFA) 108 (90 Base) MCG/ACT inhaler Inhale 1-2 puffs every 6 (six) hours as needed into the lungs for wheezing or shortness of breath.    . capecitabine (XELODA) 150 MG tablet Take 2 tablets (300 mg total) by mouth 2 (two) times daily after a meal. Take with two 573m tablets. Take for 3 weeks on, 1 week off. 84 tablet 5  . capecitabine (XELODA) 500 MG tablet Take 2 tablets (1,000 mg total) by mouth 2 (two) times daily after a meal. Take with two 1526mtablets. Take for 3 weeks on, 1 week off. 84 tablet 5  . ondansetron (ZOFRAN) 8 MG tablet One pill every 8 hours as needed for nausea/vomitting. 30 tablet 0  . pantoprazole (PROTONIX) 40 MG tablet Take 1 tablet by mouth daily.    . traMADol (ULTRAM) 50 MG tablet Take 1 tablet (50 mg total) by mouth every 8 (eight) hours as needed. 30 tablet 0  . acetaminophen (TYLENOL) 325 MG tablet Take 2 tablets (650 mg total) by mouth every 6 (six)  hours as needed for mild pain or fever (headache). (Patient not taking: Reported on 01/24/2018) 20 tablet 0  . Ascorbic Acid (VITA-C PO) Take 1,000 mg by mouth daily.     No current facility-administered medications for this visit.    Facility-Administered Medications Ordered in Other Visits  Medication Dose Route Frequency Provider Last Rate Last Dose  . sodium chloride flush (NS) 0.9 % injection 10 mL  10 mL Intravenous PRN BrCammie SickleMD   10 mL at 06/15/17 0941  . sodium chloride flush (NS) 0.9 % injection 10 mL  10 mL Intravenous PRN BrCammie SickleMD   10 mL at 01/24/18 0849    PHYSICAL EXAMINATION: ECOG PERFORMANCE STATUS: 0 - Asymptomatic  BP 99/66 (BP Location: Right Arm, Patient Position: Sitting)   Pulse 86   Temp (!) 96.7 F (35.9 C) (Tympanic)   Resp 18   Ht _0  (1.6 m)   Wt 136 lb 11 oz (62 kg)   BMI 24.21 kg/m   Filed Weights   01/24/18 0855  Weight: 136 lb 11 oz (62 kg)    Physical Exam  Constitutional: He is oriented to person, place, and time and well-developed, well-nourished, and in no distress.  HENT:  Head: Normocephalic and atraumatic.  Mouth/Throat: Oropharynx is clear and moist. No oropharyngeal exudate.  Eyes: Pupils are equal, round, and reactive to light.  Neck: Normal range of motion. Neck supple.  Cardiovascular: Normal rate and regular rhythm.  Pulmonary/Chest: No respiratory distress. He has no wheezes.  Abdominal: Soft. Bowel sounds are normal. He exhibits no distension and no mass. There is no tenderness. There is no rebound and no guarding.  Musculoskeletal: Normal range of motion. He exhibits no edema or tenderness.  Neurological: He is alert and oriented to person, place, and time.  Skin: Skin is warm.  Psychiatric: Affect normal.       LABORATORY DATA:  I have reviewed the data as listed    Component Value Date/Time   NA 139 01/24/2018 0902   K 3.8 01/24/2018 0902   CL 105 01/24/2018 0902   CO2 26  01/24/2018 0902   GLUCOSE 138 (H) 01/24/2018 0902   BUN 6 01/24/2018 0902   CREATININE 0.89 01/24/2018 0902   CALCIUM 9.0 01/24/2018 0902   PROT 7.5 01/24/2018 0902   ALBUMIN 4.0 01/24/2018 0902   AST 34 01/24/2018 0902   ALT 27 01/24/2018 0902   ALKPHOS 64 01/24/2018 0902   BILITOT 1.1 01/24/2018 0902   GFRNONAA >60 01/24/2018 0902   GFRAA >60 01/24/2018 0902    No results found for: SPEP, UPEP  Lab Results  Component Value Date   WBC 3.6 (L) 01/24/2018   NEUTROABS 1.6 01/24/2018   HGB 11.8 (L) 01/24/2018   HCT 35.1 (L) 01/24/2018   MCV 100.7 (H) 01/24/2018   PLT 427 01/24/2018      Chemistry      Component Value Date/Time   NA 139 01/24/2018 0902   K 3.8 01/24/2018 0902   CL 105 01/24/2018 0902   CO2 26 01/24/2018 0902   BUN 6 01/24/2018 0902   CREATININE 0.89 01/24/2018 0902      Component Value Date/Time   CALCIUM 9.0 01/24/2018 0902   ALKPHOS 64 01/24/2018 0902   AST 34 01/24/2018 0902   ALT 27 01/24/2018 0902   BILITOT 1.1 01/24/2018 0902       RADIOGRAPHIC STUDIES: I have personally reviewed the radiological images as listed and agreed with the findings in the report. No results found.   ASSESSMENT & PLAN:  Malignant tumor of body of pancreas ([place]) # Pancreatic adenocarcinoma stage II s/p Whipple's. Currently on adjuvant chemotherapy with gemcitabine-Xeloda. STABLE.   #Cycle #4- day-1 today; Proceed with chemo today [we will decrease the dose of gemcitabine to 800 mg/m-given neutropenia/thrombocytopenia]; Labs today reviewed;  acceptable for treatment today.   # Thrombocytopenia-improved  #Diarrhea-improved.  # Chemo today;  follow up with me in 2 week/labs-Gem/MD; gem/cbcin 1 week   No orders of the defined types were placed in this encounter.  All questions were answered. The patient knows to call the clinic with any problems, questions or concerns.      Cammie Sickle, MD 01/24/2018 12:59 PM

## 2018-01-25 ENCOUNTER — Telehealth: Payer: Self-pay | Admitting: Pharmacist

## 2018-01-25 DIAGNOSIS — C251 Malignant neoplasm of body of pancreas: Secondary | ICD-10-CM

## 2018-01-25 MED ORDER — CAPECITABINE 500 MG PO TABS: 1000.0000 mg | ORAL_TABLET | Freq: Two times a day (BID) | ORAL | 0 refills | Status: DC

## 2018-01-25 MED ORDER — CAPECITABINE 150 MG PO TABS: 300.0000 mg | ORAL_TABLET | Freq: Two times a day (BID) | ORAL | 0 refills | Status: DC

## 2018-01-25 NOTE — Telephone Encounter (Signed)
Oral Chemotherapy Pharmacist Encounter   Met with patient in the [place] clinic on 01/24/18 during his infusion appt. I have been meeting with Mr. Weedman to discussion and improve his Xeloda adherence. Today I set him up with medication tray with his twice daily Xeloda doses separated into different compartments. Through doing this I was able to get an accurate dose count to Mr. Ebanks. In order to complete his current cycle he will need 3 more days worth of Xeloda. A prescription for 3 days worth of Xeloda was sent to [place]. This will set him up to be back on schedule with his full cycle Xeloda refills.   I will meet with Mr. Humphrey at the beginning of each of his remaining cycles to set up his Xeloda medication trays.  Mr. Everton stated his understanding, I will continue to address his Xeloda adherence.    Darl Pikes, PharmD, BCPS, [facility] Hematology/Oncology Clinical Pharmacist [facility] [phone]  01/25/2018 9:39 AM

## 2018-01-30 ENCOUNTER — Other Ambulatory Visit: Payer: Medicaid Other

## 2018-01-30 ENCOUNTER — Ambulatory Visit: Payer: Medicaid Other

## 2018-01-30 MED FILL — CAPECITABINE 500 MG TABLET: 500 | 3 days supply | Qty: 12 | Fill #0

## 2018-01-30 MED FILL — CAPECITABINE 150 MG TABS: 150 | 3 days supply | Qty: 12 | Fill #0

## 2018-01-31 ENCOUNTER — Inpatient Hospital Stay: Payer: Medicaid Other

## 2018-02-07 ENCOUNTER — Inpatient Hospital Stay (HOSPITAL_BASED_OUTPATIENT_CLINIC_OR_DEPARTMENT_OTHER): Payer: Medicaid Other | Admitting: Internal Medicine

## 2018-02-07 ENCOUNTER — Telehealth: Payer: Self-pay | Admitting: Pharmacist

## 2018-02-07 ENCOUNTER — Other Ambulatory Visit: Payer: Self-pay | Admitting: Internal Medicine

## 2018-02-07 ENCOUNTER — Ambulatory Visit
Admission: RE | Admit: 2018-02-07 | Discharge: 2018-02-07 | Disposition: A | Discharge status: Home / Self Care | Payer: Medicaid Other | Source: Ambulatory Visit | Attending: Internal Medicine | Admitting: Internal Medicine

## 2018-02-07 ENCOUNTER — Inpatient Hospital Stay: Payer: Medicaid Other

## 2018-02-07 ENCOUNTER — Other Ambulatory Visit: Payer: Self-pay

## 2018-02-07 VITALS — BP 106/71 | HR 56 | Temp 97.6°F | Resp 18 | Ht 66.0 in | Wt 130.1 lb

## 2018-02-07 DIAGNOSIS — C251 Malignant neoplasm of body of pancreas: Secondary | ICD-10-CM

## 2018-02-07 DIAGNOSIS — R1033 Periumbilical pain: Secondary | ICD-10-CM | POA: Diagnosis present

## 2018-02-07 DIAGNOSIS — Z5111 Encounter for antineoplastic chemotherapy: Secondary | ICD-10-CM | POA: Diagnosis not present

## 2018-02-07 DIAGNOSIS — D701 Agranulocytosis secondary to cancer chemotherapy: Secondary | ICD-10-CM

## 2018-02-07 DIAGNOSIS — Z87891 Personal history of nicotine dependence: Secondary | ICD-10-CM

## 2018-02-07 LAB — CBC WITH DIFFERENTIAL/PLATELET
BASOS ABS: 0 10*3/uL (ref 0–0.1)
Basophils Relative: 1 %
Eosinophils Absolute: 0.1 10*3/uL (ref 0–0.7)
Eosinophils Relative: 1 %
HCT: 36.6 % — ABNORMAL LOW (ref 40.0–52.0)
HEMOGLOBIN: 12.3 g/dL — AB (ref 13.0–18.0)
LYMPHS PCT: 39 %
Lymphs Abs: 2.1 10*3/uL (ref 1.0–3.6)
MCH: 34.4 pg — ABNORMAL HIGH (ref 26.0–34.0)
MCHC: 33.7 g/dL (ref 32.0–36.0)
MCV: 102 fL — AB (ref 80.0–100.0)
Monocytes Absolute: 0.7 10*3/uL (ref 0.2–1.0)
Monocytes Relative: 13 %
Neutro Abs: 2.5 10*3/uL (ref 1.4–6.5)
Neutrophils Relative %: 46 %
Platelets: 187 10*3/uL (ref 150–440)
RBC: 3.58 MIL/uL — AB (ref 4.40–5.90)
RDW: 20.9 % — ABNORMAL HIGH (ref 11.5–14.5)
WBC: 5.3 10*3/uL (ref 3.8–10.6)

## 2018-02-07 LAB — COMPREHENSIVE METABOLIC PANEL
ALT: 31 U/L (ref 0–44)
AST: 33 U/L (ref 15–41)
Albumin: 4.1 g/dL (ref 3.5–5.0)
Alkaline Phosphatase: 63 U/L (ref 38–126)
Anion gap: 7 (ref 5–15)
BUN: 8 mg/dL (ref 6–20)
CHLORIDE: 105 mmol/L (ref 98–111)
CO2: 25 mmol/L (ref 22–32)
CREATININE: 0.78 mg/dL (ref 0.61–1.24)
Calcium: 8.9 mg/dL (ref 8.9–10.3)
GFR calc Af Amer: >60 mL/min (ref >60)
Glucose, Bld: 96 mg/dL (ref 70–99)
Potassium: 3.8 mmol/L (ref 3.5–5.1)
Sodium: 137 mmol/L (ref 135–145)
TOTAL PROTEIN: 7.4 g/dL (ref 6.5–8.1)
Total Bilirubin: 1.6 mg/dL — ABNORMAL HIGH (ref 0.3–1.2)

## 2018-02-07 MED ORDER — SODIUM CHLORIDE 0.9% FLUSH
10.0000 mL | INTRAVENOUS | Status: DC | PRN
  Administered 2018-02-07: 10 mL via INTRAVENOUS
  Filled 2018-02-07: qty 10

## 2018-02-07 MED ORDER — HEPARIN SOD (PORK) LOCK FLUSH 100 UNIT/ML IV SOLN
500.0000 [IU] | Freq: Once | INTRAVENOUS | Status: AC
  Administered 2018-02-07: 500 [IU] via INTRAVENOUS
  Filled 2018-02-07: qty 5

## 2018-02-07 NOTE — Progress Notes (Signed)
Nathaniel Saunders/brooke- Please inform patient that no obvious reason for his abdominal pain noted on the x-rays; and I would recommend abdominal CAT scan. This is ordered STAT.  Thx

## 2018-02-07 NOTE — Assessment & Plan Note (Addendum)
#   Pancreatic adenocarcinoma stage II s/p Whipple's. Currently on adjuvant chemotherapy with gemcitabine-Xeloda.  Question worsening-see discussion below  # Due for Cycle #4- day-15 today; HOLD sec to ongoing abdominal pain [see discussion below].  Recurrence of malignancy versus others.  # Abdominal pain x1 week unclear etiology with nausea vomiting this morning.  Recommend abdominal x-rays; based on that patient will likely need imaging with a CT scan.  # Thrombocytopenia-improved  # HOLD CHEMO today; ab x ray today.  # follow up with me in 1 week; gem /cbcin 1 week; # will plan CT a/p based on results of x-ray.

## 2018-02-07 NOTE — Progress Notes (Signed)
Patient reports 4-5 bowel movements per day. Patient states that these stools are soft and not diarrhea. Patient reports abdominal cramping and soreness below the umbilicus. Patient eating grilled foods in diet. Patient denies eating any greasy fatty foods. Patient reports 1 episodes of nausea and vomiting. Patient reports that the severity of pain stopped after the episodes of vomiting last pm. Patient took pepto bismol 2 days ago and this did not relieve his symptoms. He did not take any last evening. He does not take imodium.  Patient denies any mouth sores.

## 2018-02-07 NOTE — Progress Notes (Signed)
[facility] OFFICE PROGRESS NOTE  Patient Care Team: Maeola Sarah, [name], 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; [name]; [name]; [name]; [name]; [name];   # Stomach- H.Pylori [[facility]] S/p prevpack [may 2019]   ---------------------------------------------------    # Nov 2018-biliary obstruction status post ERCP; [name]; [name]; [name]; [name], [name], [name], and PALB2 c.1655A>G (p.Gln552Arg. This is still considered a normal result.  # Molecular testing Not done -------------------------------------------------   DIAGNOSIS: [ ]  Pancreatic cancer  STAGE:  II       ;GOALS: Curative  CURRENT/MOST RECENT THERAPY- Gem-Cap [June 3rd]          Malignant tumor of body of pancreas ([place])   10/11/2017 -  Chemotherapy    The patient had gemcitabine (GEMZAR) 1,600 mg in sodium chloride 0.9 % 250 mL chemo infusion, 1,672 mg, Intravenous,  Once, 4 of 6 cycles Dose modification: 900 mg/m2 (original dose 1,000 mg/m2, Cycle 3, Reason: Provider Judgment), 800 mg/m2 (original dose 1,000 mg/m2, Cycle 4, Reason: Provider Judgment) Administration: 1,600 mg (10/24/2017), 1,600 mg (11/07/2017), 1,600 mg (11/21/2017), 1,600 mg (11/28/2017), 1,400 mg (12/26/2017), 1,400 mg (01/02/2018), 1,400 mg (01/09/2018)  for chemotherapy treatment.         INTERVAL HISTORY:  Nathaniel Saunders 47 y.o.  male pleasant patient above history of stage II pancreatic cancer status post Whipple's is currently on adjuvant gemcitabine-Xeloda is here for follow-up.  Patient complains of worsening abdominal pain periumbilical rating to the lower abdomen for the last 1 week.  3-4 and a scale of 10.  Poor appetite.  Positive for weight loss.  4-5 formed loose stools.  Had episode of nausea and vomiting this morning.  Patient is currently on Xeloda.  No fevers or chills.  Review of Systems  Constitutional: Positive for weight loss. Negative for chills, diaphoresis, fever and malaise/fatigue.  HENT: Negative for nosebleeds and sore throat.   Eyes: Negative for double vision.  Respiratory: Negative for cough, hemoptysis, sputum production, shortness of breath and wheezing.   Cardiovascular: Negative for chest pain, palpitations, orthopnea and leg swelling.  Gastrointestinal: Positive for abdominal pain, nausea and vomiting. Negative for blood in stool, constipation, heartburn and melena.  Genitourinary: Negative for dysuria, frequency and urgency.  Musculoskeletal: Negative for back pain and joint pain.  Skin: Negative.  Negative for itching and rash.  Neurological: Negative for dizziness, tingling, focal weakness, weakness and headaches.  Endo/Heme/Allergies: Does not bruise/bleed easily.  Psychiatric/Behavioral: Negative for depression. The patient is not nervous/anxious and does not have insomnia.       PAST MEDICAL HISTORY :  Past Medical History:  Diagnosis Date  . Asthma   . Genetic testing 05/20/2017   Multi-Cancer panel (83 genes) @ [facility] - No pathogenic mutations detected  . Pancreatic cancer ([place])   . Pancreatic mass     PAST SURGICAL HISTORY :   Past Surgical History:  Procedure Laterality Date  . ERCP N/A  04/21/2017   Procedure: ENDOSCOPIC RETROGRADE CHOLANGIOPANCREATOGRAPHY (ERCP);  Surgeon: Lucilla Lame, MD;  Location:  [facility] ENDOSCOPY;  Service: Endoscopy;  Laterality: N/A;  . ERCP N/A 05/06/2017   Procedure: ENDOSCOPIC RETROGRADE CHOLANGIOPANCREATOGRAPHY (ERCP);  Surgeon: Lucilla Lame, MD;  Location: [facility] ENDOSCOPY;  Service: Endoscopy;  Laterality: N/A;  . PANCREATECTOMY, PROXIMAL SUBTOTAL WITH TOTAL DUODENECTOMY, PARTIAL GASTRECTOMY, CHOLEDOCHOENTEROSTOMY AND GASTROJEJUNOSTOMY (WHIPPLE-TYPE PROCEDURE); WITHOUT PANCREATOJEJUNOSTOMY  09/06/2017  . PORTA CATH INSERTION N/A 04/18/2017   Procedure: PORTA CATH INSERTION;  Surgeon: Algernon Huxley, MD;  Location: [place] CV LAB;  Service: Cardiovascular;  Laterality: N/A;    FAMILY HISTORY :   Family History  Problem Relation Age of Onset  . Pancreatic cancer Mother 82       deceased 50  . Diabetes Father   . Colon cancer Maternal Grandmother 70       deceased 33    SOCIAL HISTORY:   Social History   Tobacco Use  . Smoking status: Former Smoker    Packs/day: 0.25    Years: 5.00    Pack years: 1.25    Types: Cigarettes    Last attempt to quit: 05/07/2015    Years since quitting: 2.7  . Smokeless tobacco: Never Used  Substance Use Topics  . Alcohol use: No  . Drug use: No    ALLERGIES:  has No Known Allergies.  MEDICATIONS:  Current Outpatient Medications  Medication Sig Dispense Refill  . albuterol (PROVENTIL HFA;VENTOLIN HFA) 108 (90 Base) MCG/ACT inhaler Inhale 1-2 puffs every 6 (six) hours as needed into the lungs for wheezing or shortness of breath.    . Ascorbic Acid (VITA-C PO) Take 1,000 mg by mouth daily.    . capecitabine (XELODA) 150 MG tablet Take 2 tablets (300 mg total) by mouth 2 (two) times daily after a meal. Take with two 534m tablets. 12 tablet 0  . capecitabine (XELODA) 500 MG tablet Take 2 tablets (1,000 mg total) by mouth 2 (two) times daily after a meal. Take with two 1576mtablets. 12 tablet 0  . ondansetron (ZOFRAN) 8 MG tablet One pill every 8 hours as needed for nausea/vomitting. 30 tablet 0  . pantoprazole  (PROTONIX) 40 MG tablet Take 1 tablet by mouth daily.    . traMADol (ULTRAM) 50 MG tablet Take 1 tablet (50 mg total) by mouth every 8 (eight) hours as needed. 30 tablet 0  . acetaminophen (TYLENOL) 325 MG tablet Take 2 tablets (650 mg total) by mouth every 6 (six) hours as needed for mild pain or fever (headache). (Patient not taking: Reported on 01/24/2018) 20 tablet 0   No current facility-administered medications for this visit.    Facility-Administered Medications Ordered in Other Visits  Medication Dose Route Frequency Provider Last Rate Last Dose  . heparin lock flush 100 unit/mL  500 Units Intravenous Once BrCharlaine Dalton, MD      . sodium chloride flush (NS) 0.9 % injection 10 mL  10 mL Intravenous PRN BrCammie SickleMD   10 mL at 06/15/17 0941  . sodium chloride flush (NS) 0.9 % injection 10 mL  10 mL Intravenous PRN BrCammie SickleMD   10 mL at 02/07/18 0929    PHYSICAL EXAMINATION: ECOG PERFORMANCE STATUS: 0 - Asymptomatic  BP 106/71 (BP Location: Right Arm)   Pulse (!) 56   Temp 97.6 F (36.4 C)   Resp 18   Ht 5' 6"  (1.676 m)   Wt 130 lb 1.1 oz (59 kg)  BMI 20.99 kg/m   Filed Weights   02/07/18 0938  Weight: 130 lb 1.1 oz (59 kg)    Physical Exam  Constitutional: He is oriented to person, place, and time and well-developed, well-nourished, and in no distress.  HENT:  Head: Normocephalic and atraumatic.  Mouth/Throat: Oropharynx is clear and moist. No oropharyngeal exudate.  Eyes: Pupils are equal, round, and reactive to light.  Neck: Normal range of motion. Neck supple.  Cardiovascular: Normal rate and regular rhythm.  Pulmonary/Chest: No respiratory distress. He has no wheezes.  Abdominal: Soft. Bowel sounds are normal. He exhibits no distension and no mass. There is no tenderness. There is no rebound and no guarding.  Musculoskeletal: Normal range of motion. He exhibits no edema or tenderness.  Neurological: He is alert and oriented to  person, place, and time.  Skin: Skin is warm.  Psychiatric: Affect normal.       LABORATORY DATA:  I have reviewed the data as listed    Component Value Date/Time   NA 137 02/07/2018 0927   K 3.8 02/07/2018 0927   CL 105 02/07/2018 0927   CO2 25 02/07/2018 0927   GLUCOSE 96 02/07/2018 0927   BUN 8 02/07/2018 0927   CREATININE 0.78 02/07/2018 0927   CALCIUM 8.9 02/07/2018 0927   PROT 7.4 02/07/2018 0927   ALBUMIN 4.1 02/07/2018 0927   AST 33 02/07/2018 0927   ALT 31 02/07/2018 0927   ALKPHOS 63 02/07/2018 0927   BILITOT 1.6 (H) 02/07/2018 0927   GFRNONAA >60 02/07/2018 0927   GFRAA >60 02/07/2018 0927    No results found for: SPEP, UPEP  Lab Results  Component Value Date   WBC 5.3 02/07/2018   NEUTROABS 2.5 02/07/2018   HGB 12.3 (L) 02/07/2018   HCT 36.6 (L) 02/07/2018   MCV 102.0 (H) 02/07/2018   PLT 187 02/07/2018      Chemistry      Component Value Date/Time   NA 137 02/07/2018 0927   K 3.8 02/07/2018 0927   CL 105 02/07/2018 0927   CO2 25 02/07/2018 0927   BUN 8 02/07/2018 0927   CREATININE 0.78 02/07/2018 0927      Component Value Date/Time   CALCIUM 8.9 02/07/2018 0927   ALKPHOS 63 02/07/2018 0927   AST 33 02/07/2018 0927   ALT 31 02/07/2018 0927   BILITOT 1.6 (H) 02/07/2018 0927       RADIOGRAPHIC STUDIES: I have personally reviewed the radiological images as listed and agreed with the findings in the report. No results found.   ASSESSMENT & PLAN:  Malignant tumor of body of pancreas ([place]) # Pancreatic adenocarcinoma stage II s/p Whipple's. Currently on adjuvant chemotherapy with gemcitabine-Xeloda.  Question worsening-see discussion below  # Due for Cycle #4- day-15 today; HOLD sec to ongoing abdominal pain [see discussion below].  Recurrence of malignancy versus others.  # Abdominal pain x1 week unclear etiology with nausea vomiting this morning.  Recommend abdominal x-rays; based on that patient will likely need imaging with a CT  scan.  # Thrombocytopenia-improved  # HOLD CHEMO today; ab x ray today.  # follow up with me in 1 week; gem /cbcin 1 week; # will plan CT a/p based on results of x-ray.    Orders Placed This Encounter  Procedures  . DG Abd 2 Views    Standing Status:   Future    Standing Expiration Date:   04/10/2019    Order Specific Question:   Reason for Exam (SYMPTOM  OR DIAGNOSIS REQUIRED)    Answer:   abdominal pain    Order Specific Question:   Preferred imaging location?    Answer:   [facility]    Order Specific Question:   Radiology Contrast Protocol - do NOT remove file path    Answer:   [email or web address]   All questions were answered. The patient knows to call the clinic with any problems, questions or concerns.      Cammie Sickle, MD 02/07/2018 10:14 AM

## 2018-02-07 NOTE — Patient Instructions (Signed)
HOLD Xeloda until further instructions

## 2018-02-08 NOTE — Telephone Encounter (Signed)
Oral Chemotherapy Pharmacist Encounter   Meet with patient in [place] following his clinic appt. I was there to provider him with 3 days worth of Xeloda that would be used to complete this pill tray cycle. The pill trays are helping the patient stay adherent to his medication, the appropriate number of days of medication were taken from the pill trays. I filled his medication tray the the 3 days worth of Xeloda from [place].  At this time Dr. Rogue Bussing is going to hold his Xeloda. He understands that Dr. Rogue Bussing will let him know when to resume his Xeloda. When he resumes I will make him a new calendar to reflect this dose hold.   Darl Pikes, PharmD, BCPS, [facility] Hematology/Oncology Clinical Pharmacist [facility] [phone]  02/08/2018 11:32 AM

## 2018-02-08 NOTE — Progress Notes (Signed)
Robin, I spoke with the patient. He is agreeable for the CT scan. I sent a msg to [place] in [place] dept to determine if scan needs a PA. Please arrange ct scan. Thanks.

## 2018-02-09 ENCOUNTER — Telehealth: Payer: Self-pay | Admitting: Internal Medicine

## 2018-02-09 ENCOUNTER — Ambulatory Visit
Admission: RE | Admit: 2018-02-09 | Discharge: 2018-02-09 | Disposition: A | Discharge status: Home / Self Care | Payer: Medicaid Other | Source: Ambulatory Visit | Attending: Internal Medicine | Admitting: Internal Medicine

## 2018-02-09 DIAGNOSIS — C251 Malignant neoplasm of body of pancreas: Secondary | ICD-10-CM | POA: Diagnosis present

## 2018-02-09 DIAGNOSIS — R1033 Periumbilical pain: Secondary | ICD-10-CM | POA: Insufficient documentation

## 2018-02-09 MED ORDER — METRONIDAZOLE 500 MG PO TABS: 500.0000 mg | ORAL_TABLET | Freq: Three times a day (TID) | ORAL | 0 refills | Status: DC

## 2018-02-09 MED ORDER — IOPAMIDOL (ISOVUE-300) INJECTION 61%: 100.0000 mL | Freq: Once | INTRAVENOUS | Status: DC | PRN

## 2018-02-09 MED ORDER — IOHEXOL 300 MG/ML  SOLN
150.0000 mL | Freq: Once | INTRAMUSCULAR | Status: AC | PRN
  Administered 2018-02-09: 100 mL via INTRAVENOUS

## 2018-02-09 MED ORDER — CIPROFLOXACIN HCL 500 MG PO TABS: 500.0000 mg | ORAL_TABLET | Freq: Two times a day (BID) | ORAL | 0 refills | Status: DC

## 2018-02-09 NOTE — Telephone Encounter (Signed)
FYI-  Spoke to patient the results of the CT scan; continues to have intermittent diarrhea /abdominal discomfort.  No gross evidence of disease recurrence noted on the CT scan  Recommend probiotics over-the-counter; ciprofloxacin and Flagyl-patients pharmacy/sent to pharmacy.   To keep appt as planned.

## 2018-02-14 ENCOUNTER — Inpatient Hospital Stay: Payer: Medicaid Other

## 2018-02-14 ENCOUNTER — Telehealth: Payer: Self-pay | Admitting: *Deleted

## 2018-02-14 ENCOUNTER — Inpatient Hospital Stay: Payer: Medicaid Other | Admitting: Internal Medicine

## 2018-02-14 NOTE — Assessment & Plan Note (Deleted)
#   Pancreatic adenocarcinoma stage II s/p Whipple's. Currently on adjuvant chemotherapy with gemcitabine-Xeloda.  Question worsening-see discussion below  # Due for Cycle #4- day-15 today; HOLD sec to ongoing abdominal pain [see discussion below].  Recurrence of malignancy versus others.  # Abdominal pain x1 week unclear etiology with nausea vomiting this morning.  Recommend abdominal x-rays; based on that patient will likely need imaging with a CT scan.  # Thrombocytopenia-improved  # HOLD CHEMO today; ab x ray today.  # follow up with me in 1 week; gem /cbcin 1 week; # will plan CT a/p based on results of x-ray.

## 2018-02-14 NOTE — Telephone Encounter (Signed)
-----   Message from Secundino Ginger sent at 02/14/2018  3:45 PM EDT ----- Regarding: Lael nelum Contact: [phone] Wife called me back and set his appt for next Tuesday, she said he did not feel good has been in bed for 2 days and has not eaten very much. She said he did eat a lil today. When she gets home form work if he's not feeling any better she will call.

## 2018-02-14 NOTE — Progress Notes (Deleted)
[facility] OFFICE PROGRESS NOTE  Patient Care Team: Maeola Sarah, [name], 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; [name]; [name]; [name]; [name]; [name];   # Stomach- H.Pylori [[facility]] S/p prevpack [may 2019]   ---------------------------------------------------    # Nov 2018-biliary obstruction status post ERCP; [name]; [name]; [name]; [name], [name], [name], and PALB2 c.1655A>G (p.Gln552Arg. This is still considered a normal result.  # Molecular testing Not done -------------------------------------------------   DIAGNOSIS: [ ]  Pancreatic cancer  STAGE:  II       ;GOALS: Curative  CURRENT/MOST RECENT THERAPY- Gem-Cap [June 3rd]          Malignant tumor of body of pancreas ([place])   10/11/2017 -  Chemotherapy    The patient had gemcitabine (GEMZAR) 1,600 mg in sodium chloride 0.9 % 250 mL chemo infusion, 1,672 mg, Intravenous,  Once, 4 of 6 cycles Dose modification: 900 mg/m2 (original dose 1,000 mg/m2, Cycle 3, Reason: Provider Judgment), 800 mg/m2 (original dose 1,000 mg/m2, Cycle 4, Reason: Provider Judgment) Administration: 1,600 mg (10/24/2017), 1,600 mg (11/07/2017), 1,600 mg (11/21/2017), 1,600 mg (11/28/2017), 1,400 mg (12/26/2017), 1,400 mg (01/02/2018), 1,400 mg (01/09/2018)  for chemotherapy treatment.        INTERVAL HISTORY:  Nathaniel Saunders 47 y.o.  male pleasant patient above history of stage II pancreatic cancer status post Whipple's is currently on adjuvant gemcitabine-Xeloda is here for follow-up.  Patient complains of worsening abdominal pain periumbilical rating to the lower abdomen for the last 1 week.  3-4 and a scale of 10.  Poor appetite.  Positive for weight loss.  4-5 formed loose stools.  Had episode of nausea and vomiting this morning.  Patient is currently on Xeloda.  No fevers or chills.  Review of Systems  Constitutional: Positive for weight loss. Negative for chills, diaphoresis, fever and malaise/fatigue.  HENT: Negative for nosebleeds and sore throat.   Eyes: Negative for double vision.  Respiratory: Negative for cough, hemoptysis, sputum production, shortness of breath and wheezing.   Cardiovascular: Negative for chest pain, palpitations, orthopnea and leg swelling.  Gastrointestinal: Positive for abdominal pain, nausea and vomiting. Negative for blood in stool, constipation, heartburn and melena.  Genitourinary: Negative for dysuria, frequency and urgency.  Musculoskeletal: Negative for back pain and joint pain.  Skin: Negative.  Negative for itching and rash.  Neurological: Negative for dizziness, tingling, focal weakness, weakness and headaches.  Endo/Heme/Allergies: Does not bruise/bleed easily.  Psychiatric/Behavioral: Negative for depression. The patient is not nervous/anxious and does not have insomnia.       PAST MEDICAL HISTORY :  Past Medical History:  Diagnosis Date  . Asthma   . Genetic testing 05/20/2017   Multi-Cancer panel (83 genes) @ [facility] - No pathogenic mutations detected  . Pancreatic cancer ([place])   . Pancreatic mass     PAST SURGICAL HISTORY :   Past Surgical History:  Procedure Laterality Date  . ERCP N/A 04/21/2017  Procedure: ENDOSCOPIC RETROGRADE CHOLANGIOPANCREATOGRAPHY (ERCP);  Surgeon: Lucilla Lame, MD;  Location:  [facility] ENDOSCOPY;  Service: Endoscopy;  Laterality: N/A;  . ERCP N/A 05/06/2017   Procedure: ENDOSCOPIC RETROGRADE CHOLANGIOPANCREATOGRAPHY (ERCP);  Surgeon: Lucilla Lame, MD;  Location: [facility] ENDOSCOPY;  Service: Endoscopy;  Laterality: N/A;  . PANCREATECTOMY, PROXIMAL SUBTOTAL WITH TOTAL DUODENECTOMY, PARTIAL GASTRECTOMY, CHOLEDOCHOENTEROSTOMY AND GASTROJEJUNOSTOMY (WHIPPLE-TYPE PROCEDURE); WITHOUT PANCREATOJEJUNOSTOMY  09/06/2017  . PORTA CATH INSERTION N/A 04/18/2017   Procedure: PORTA CATH INSERTION;  Surgeon: Algernon Huxley, MD;  Location: [place] CV LAB;  Service: Cardiovascular;  Laterality: N/A;    FAMILY HISTORY :   Family History  Problem Relation Age of Onset  . Pancreatic cancer Mother 73       deceased 28  . Diabetes Father   . Colon cancer Maternal Grandmother 70       deceased 41    SOCIAL HISTORY:   Social History   Tobacco Use  . Smoking status: Former Smoker    Packs/day: 0.25    Years: 5.00    Pack years: 1.25    Types: Cigarettes    Last attempt to quit: 05/07/2015    Years since quitting: 2.7  . Smokeless tobacco: Never Used  Substance Use Topics  . Alcohol use: No  . Drug use: No    ALLERGIES:  has No Known Allergies.  MEDICATIONS:  Current Outpatient Medications  Medication Sig Dispense Refill  . acetaminophen (TYLENOL) 325 MG tablet Take 2 tablets (650 mg total) by mouth every 6 (six) hours as needed for mild pain or fever (headache). (Patient not taking: Reported on 01/24/2018) 20 tablet 0  . albuterol (PROVENTIL HFA;VENTOLIN HFA) 108 (90 Base) MCG/ACT inhaler Inhale 1-2 puffs every 6 (six) hours as needed into the lungs for wheezing or shortness of breath.    . Ascorbic Acid (VITA-C PO) Take 1,000 mg by mouth daily.    . capecitabine (XELODA) 150 MG tablet Take 2 tablets (300 mg total) by mouth 2 (two) times daily after a meal. Take with two 566m tablets. 12 tablet 0  . capecitabine (XELODA) 500 MG tablet Take 2 tablets (1,000 mg total) by mouth  2 (two) times daily after a meal. Take with two 153mtablets. 12 tablet 0  . ciprofloxacin (CIPRO) 500 MG tablet Take 1 tablet (500 mg total) by mouth 2 (two) times daily. 20 tablet 0  . metroNIDAZOLE (FLAGYL) 500 MG tablet Take 1 tablet (500 mg total) by mouth 3 (three) times daily. 30 tablet 0  . ondansetron (ZOFRAN) 8 MG tablet One pill every 8 hours as needed for nausea/vomitting. 30 tablet 0  . pantoprazole (PROTONIX) 40 MG tablet Take 1 tablet by mouth daily.    . traMADol (ULTRAM) 50 MG tablet Take 1 tablet (50 mg total) by mouth every 8 (eight) hours as needed. 30 tablet 0   No current facility-administered medications for this visit.    Facility-Administered Medications Ordered in Other Visits  Medication Dose Route Frequency Provider Last Rate Last Dose  . sodium chloride flush (NS) 0.9 % injection 10 mL  10 mL Intravenous PRN BrCammie SickleMD   10 mL at 06/15/17 0941    PHYSICAL EXAMINATION: ECOG PERFORMANCE STATUS: 0 - Asymptomatic  There were no vitals taken for this visit.  There were no vitals filed for this visit.  Physical Exam  Constitutional: He is oriented to person, place, and time and well-developed, well-nourished, and in no distress.  HENT:  Head: Normocephalic and atraumatic.  Mouth/Throat:  Oropharynx is clear and moist. No oropharyngeal exudate.  Eyes: Pupils are equal, round, and reactive to light.  Neck: Normal range of motion. Neck supple.  Cardiovascular: Normal rate and regular rhythm.  Pulmonary/Chest: No respiratory distress. He has no wheezes.  Abdominal: Soft. Bowel sounds are normal. He exhibits no distension and no mass. There is no tenderness. There is no rebound and no guarding.  Musculoskeletal: Normal range of motion. He exhibits no edema or tenderness.  Neurological: He is alert and oriented to person, place, and time.  Skin: Skin is warm.  Psychiatric: Affect normal.       LABORATORY DATA:  I have reviewed the data as  listed    Component Value Date/Time   NA 137 02/07/2018 0927   K 3.8 02/07/2018 0927   CL 105 02/07/2018 0927   CO2 25 02/07/2018 0927   GLUCOSE 96 02/07/2018 0927   BUN 8 02/07/2018 0927   CREATININE 0.78 02/07/2018 0927   CALCIUM 8.9 02/07/2018 0927   PROT 7.4 02/07/2018 0927   ALBUMIN 4.1 02/07/2018 0927   AST 33 02/07/2018 0927   ALT 31 02/07/2018 0927   ALKPHOS 63 02/07/2018 0927   BILITOT 1.6 (H) 02/07/2018 0927   GFRNONAA >60 02/07/2018 0927   GFRAA >60 02/07/2018 0927    No results found for: SPEP, UPEP  Lab Results  Component Value Date   WBC 5.3 02/07/2018   NEUTROABS 2.5 02/07/2018   HGB 12.3 (L) 02/07/2018   HCT 36.6 (L) 02/07/2018   MCV 102.0 (H) 02/07/2018   PLT 187 02/07/2018      Chemistry      Component Value Date/Time   NA 137 02/07/2018 0927   K 3.8 02/07/2018 0927   CL 105 02/07/2018 0927   CO2 25 02/07/2018 0927   BUN 8 02/07/2018 0927   CREATININE 0.78 02/07/2018 0927      Component Value Date/Time   CALCIUM 8.9 02/07/2018 0927   ALKPHOS 63 02/07/2018 0927   AST 33 02/07/2018 0927   ALT 31 02/07/2018 0927   BILITOT 1.6 (H) 02/07/2018 0927       RADIOGRAPHIC STUDIES: I have personally reviewed the radiological images as listed and agreed with the findings in the report. No results found.   ASSESSMENT & PLAN:  No problem-specific Assessment & Plan notes found for this encounter.   No orders of the defined types were placed in this encounter.  All questions were answered. The patient knows to call the clinic with any problems, questions or concerns.      Cammie Sickle, MD 02/14/2018 8:53 AM

## 2018-02-15 ENCOUNTER — Inpatient Hospital Stay: Payer: Medicaid Other

## 2018-02-15 ENCOUNTER — Inpatient Hospital Stay (HOSPITAL_BASED_OUTPATIENT_CLINIC_OR_DEPARTMENT_OTHER): Payer: Medicaid Other | Admitting: Oncology

## 2018-02-15 VITALS — BP 119/76 | HR 58 | Temp 96.0°F | Resp 16

## 2018-02-15 DIAGNOSIS — R319 Hematuria, unspecified: Secondary | ICD-10-CM

## 2018-02-15 DIAGNOSIS — E860 Dehydration: Secondary | ICD-10-CM

## 2018-02-15 DIAGNOSIS — C251 Malignant neoplasm of body of pancreas: Secondary | ICD-10-CM

## 2018-02-15 DIAGNOSIS — Z5111 Encounter for antineoplastic chemotherapy: Secondary | ICD-10-CM

## 2018-02-15 DIAGNOSIS — R109 Unspecified abdominal pain: Secondary | ICD-10-CM

## 2018-02-15 DIAGNOSIS — D709 Neutropenia, unspecified: Secondary | ICD-10-CM | POA: Diagnosis not present

## 2018-02-15 DIAGNOSIS — Z87891 Personal history of nicotine dependence: Secondary | ICD-10-CM | POA: Diagnosis not present

## 2018-02-15 DIAGNOSIS — R110 Nausea: Secondary | ICD-10-CM

## 2018-02-15 DIAGNOSIS — Z95828 Presence of other vascular implants and grafts: Secondary | ICD-10-CM

## 2018-02-15 DIAGNOSIS — M545 Low back pain: Secondary | ICD-10-CM

## 2018-02-15 LAB — COMPREHENSIVE METABOLIC PANEL
ALBUMIN: 4 g/dL (ref 3.5–5.0)
ALK PHOS: 54 U/L (ref 38–126)
ALT: 18 U/L (ref 0–44)
ANION GAP: 7 (ref 5–15)
AST: 27 U/L (ref 15–41)
BILIRUBIN TOTAL: 1 mg/dL (ref 0.3–1.2)
BUN: 9 mg/dL (ref 6–20)
CALCIUM: 8.9 mg/dL (ref 8.9–10.3)
CO2: 25 mmol/L (ref 22–32)
Chloride: 102 mmol/L (ref 98–111)
Creatinine, Ser: 0.88 mg/dL (ref 0.61–1.24)
GFR calc Af Amer: >60 mL/min (ref >60)
Glucose, Bld: 145 mg/dL — ABNORMAL HIGH (ref 70–99)
Potassium: 3.6 mmol/L (ref 3.5–5.1)
SODIUM: 134 mmol/L — AB (ref 135–145)
TOTAL PROTEIN: 7.3 g/dL (ref 6.5–8.1)

## 2018-02-15 LAB — CBC WITH DIFFERENTIAL/PLATELET
BASOS PCT: 1 %
Basophils Absolute: 0.1 10*3/uL (ref 0–0.1)
EOS ABS: 0.1 10*3/uL (ref 0–0.7)
Eosinophils Relative: 2 %
HCT: 36.7 % — ABNORMAL LOW (ref 40.0–52.0)
HEMOGLOBIN: 12.6 g/dL — AB (ref 13.0–18.0)
LYMPHS ABS: 1.5 10*3/uL (ref 1.0–3.6)
Lymphocytes Relative: 28 %
MCH: 35.1 pg — AB (ref 26.0–34.0)
MCHC: 34.4 g/dL (ref 32.0–36.0)
MCV: 102.1 fL — ABNORMAL HIGH (ref 80.0–100.0)
MONOS PCT: 12 %
Monocytes Absolute: 0.6 10*3/uL (ref 0.2–1.0)
Neutro Abs: 3 10*3/uL (ref 1.4–6.5)
Neutrophils Relative %: 57 %
Platelets: 277 10*3/uL (ref 150–440)
RBC: 3.59 MIL/uL — ABNORMAL LOW (ref 4.40–5.90)
RDW: 18.1 % — AB (ref 11.5–14.5)
WBC: 5.3 10*3/uL (ref 3.8–10.6)

## 2018-02-15 LAB — URINALYSIS, COMPLETE (UACMP) WITH MICROSCOPIC
BILIRUBIN URINE: NEGATIVE
Bacteria, UA: NONE SEEN
Glucose, UA: NEGATIVE mg/dL
KETONES UR: NEGATIVE mg/dL
NITRITE: NEGATIVE
PH: 6 (ref 5.0–8.0)
Protein, ur: NEGATIVE mg/dL
SPECIFIC GRAVITY, URINE: 1.019 (ref 1.005–1.030)

## 2018-02-15 LAB — MAGNESIUM: MAGNESIUM: 1.8 mg/dL (ref 1.7–2.4)

## 2018-02-15 MED ORDER — SODIUM CHLORIDE 0.9 % IV SOLN: Freq: Once | INTRAVENOUS | Status: DC

## 2018-02-15 MED ORDER — SODIUM CHLORIDE 0.9% FLUSH
10.0000 mL | INTRAVENOUS | Status: DC | PRN
  Administered 2018-02-15: 10 mL via INTRAVENOUS
  Filled 2018-02-15: qty 10

## 2018-02-15 MED ORDER — SUCRALFATE 1 G PO TABS: 1.0000 g | ORAL_TABLET | Freq: Four times a day (QID) | ORAL | 1 refills | Status: DC

## 2018-02-15 MED ORDER — HYDROCODONE-ACETAMINOPHEN 5-325 MG PO TABS
1.0000 | ORAL_TABLET | Freq: Once | ORAL | Status: AC
  Administered 2018-02-15: 1 via ORAL
  Filled 2018-02-15: qty 1

## 2018-02-15 MED ORDER — SODIUM CHLORIDE 0.9 % IV SOLN
Freq: Once | INTRAVENOUS | Status: AC
  Administered 2018-02-15: 14:00:00 via INTRAVENOUS
  Filled 2018-02-15: qty 250

## 2018-02-15 MED ORDER — DEXAMETHASONE SODIUM PHOSPHATE 10 MG/ML IJ SOLN
10.0000 mg | Freq: Once | INTRAMUSCULAR | Status: AC
  Administered 2018-02-15: 10 mg via INTRAVENOUS
  Filled 2018-02-15: qty 1
  Filled 2018-02-15: qty 3

## 2018-02-15 MED ORDER — ONDANSETRON HCL 4 MG/2ML IJ SOLN
8.0000 mg | Freq: Once | INTRAMUSCULAR | Status: AC
  Administered 2018-02-15: 8 mg via INTRAVENOUS
  Filled 2018-02-15: qty 4

## 2018-02-15 MED ORDER — TRAMADOL HCL 50 MG PO TABS: 50.0000 mg | ORAL_TABLET | Freq: Three times a day (TID) | ORAL | 0 refills | Status: DC | PRN

## 2018-02-15 MED ORDER — HEPARIN SOD (PORK) LOCK FLUSH 100 UNIT/ML IV SOLN
500.0000 [IU] | Freq: Once | INTRAVENOUS | Status: AC
  Administered 2018-02-15: 500 [IU] via INTRAVENOUS

## 2018-02-15 NOTE — Progress Notes (Signed)
Symptom Management Consult note [facility]  [phone] [phone]  Patient Care Team: Maeola Sarah, [name], RN as Registered Nurse   Name of the patient: Nathaniel Saunders  [id number]  25-Feb-1971   Date of visit: 02/15/2018  Diagnosis: 1. Right low back pain, unspecified chronicity, with sciatica presence unspecified - Urinalysis, Complete w Microscopic; Future  2. Dehydration - Magnesium; Future   Chief Complaint:  Back pain and dehydration  Current Treatment: s/p cycle 4 Gemzar. Last given 01/24/18.  Oncology History: Patient was seen by primary medical oncologist Dr. Rogue Bussing on 02/07/2018 for follow-up prior to cycle 4 gemcitabine/Xeloda.  Dose reduced given neutropenia and thrombocytopenia.  He complained of worsening abdominal pain in the periumbilical area for the past week.  Rated it a 3 out of 10.  Admitted to a poor appetite and weight loss.  Having 4-5 formed loose stools daily.  Has had intermittent nausea and vomiting.  Cycle 4-day 15 was held due to ongoing abdominal pain.  Obtain stat abdominal x-ray which revealed mild gaseous enlargement of upper abdomen small bowel that was nonspecific.  Recommended CT scan to rule out obstruction. CT scan negative for obstruction.    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; [name]; [name]; [name]; [name]; [name];   # Stomach- H.Pylori [[facility]] S/p prevpack [may 2019]   ---------------------------------------------------    # Nov 2018-biliary obstruction status post ERCP; [name]; DEC 14th- ERCP [Dr.wohl] s/p metal stent   # Family history of pancreatic cancer-  83 genes on  [facility]'s Multi-Cancer panel-NEGATIVE [[name]; [name]; [name], [name], [name], and PALB2 c.1655A>G (p.Gln552Arg. This is still considered a normal result.  # Molecular testing Not done -------------------------------------------------   DIAGNOSIS: [ ]  Pancreatic cancer  STAGE:  II       ;GOALS: Curative  CURRENT/MOST RECENT THERAPY- Gem-Cap [June 3rd]          Malignant tumor of body of pancreas ([place])   10/11/2017 -  Chemotherapy    The patient had gemcitabine (GEMZAR) 1,600 mg in sodium chloride 0.9 % 250 mL chemo infusion, 1,672 mg, Intravenous,  Once, 4 of 6 cycles Dose modification: 900 mg/m2 (original dose 1,000 mg/m2, Cycle 3, Reason: Provider Judgment), 800 mg/m2 (original dose 1,000 mg/m2, Cycle 4, Reason: Provider Judgment) Administration: 1,600 mg (10/24/2017), 1,600 mg (11/07/2017), 1,600 mg (11/21/2017), 1,600 mg (11/28/2017), 1,400 mg (12/26/2017), 1,400 mg (01/02/2018), 1,400 mg (01/09/2018)  for chemotherapy treatment.      Subjective Data:  Subjective:     Nathaniel Saunders is a 47 y.o. male who presents for evaluation of abdominal pain, right sided flank pain and left sided inguinal adenopathy. Onset was 2 days ago. Symptoms have been gradually worsening. The pain is described as aching, sharp, shooting and stabbing, and is 8/10 in intensity. Pain is located in the epigastric region, suprapubic region and costovertebral angle on the right without radiation.  Aggravating factors: eating and standing.  Alleviating factors: acetaminophen and applying pressure to flank. Associated symptoms: anorexia, chills, hematuria, nausea and sweats. The patient denies constipation, diarrhea, flatus and hematochezia.  The patient's history has been marked as reviewed and updated as appropriate.  Review  of Systems A comprehensive review of systems was negative except for:  Constitutional: positive for anorexia, chills, fatigue, night sweats, sweats and weight loss Gastrointestinal: positive for abdominal pain, nausea and reflux symptoms Genitourinary: positive for frequency and hematuria Musculoskeletal: positive for back pain and muscle weakness     Objective:    BP 119/76 (BP Location: Right Arm, Patient Position: Sitting)   Pulse (!) 58   Temp (!) 96 F (35.6 C) (Tympanic)   Resp 16   SpO2 98%  General appearance: alert, fatigued and mild distress Back: CVA tenderness to right flank Lungs: clear to auscultation bilaterally Abdomen: abnormal findings:  hypoactive bowel sounds and marked tenderness in the epigastrium and in the right flank Male genitalia: normal Lymph nodes: Inguinal adenopathy: left  Neurologic: Grossly normal    Assessment:    Abdominal pain, likely secondary to known pancreatic cancer .  Other differentials include gastric ulcer, gallstones, heartburn, pancreatitis and hernia.  Right flank pain: hx of kidney stones. Kidney function ok and baseline (non-obstructing). UA and Urine Culture. UA revealed moderate amounts of hematuria, trace leukocytes. Urine culture sent.  Left-sided inguinal adenopathy: Likely d/t known cancer or infection.      Plan:    The diagnosis was discussed with the patient and evaluation and treatment plans outlined. See orders for lab and imaging studies. Further follow-up plans will be based on outcome of lab/imaging studies; see orders.    Stage II pancreatic cancer: s/p Cycle 4 Gemzar. Last given 01/24/18.  Scheduled to return to clinic on 02/21/2018 for further evaluation with labs and cycle 5 Gemzar.  Epigastric abdominal pain: Previously worked up by Dr. Rogue Bussing.  Recently had a CT abdomen pelvis with contrast (02/09/18).  Did not reveal any acute abdomen or pelvic findings to explain symptoms.  There is no evidence of bowel obstruction or anastomotic leak.  Normal post Whipple changes noted. Had  mild bowel wall thickening that was nonspecific.  He was placed on Cipro 500 mg twice daily for 10 days and Flagyl 500 mg 3 times daily for 7 days.  Has been taking ibuprofen and Tylenol for pain with no relief. RX tramadol 50 mg q 8 hours PRN and Carafate 1 g tablets QID for ulcer/gerd like symptoms.  Acute right sided flank pain: CVA tenderness present.  Has history of kidney stones. Last > 10 years ago.  Kidney function appears to be baseline.  Moderate amount of hematuria.  Will get CT stone study ASAP.  If positive will refer to urology for further evaluation and plan.  CT stone study scheduled for 02/16/2018 at 10 AM.  Non tender left inguinal adenopathy: Unclear etiology.  Likely due to infection or cancer.  Other differentials include STD or lymphoma. Both unlikely. Non tender adenpathy.  Often treated with broad-spectrum antibiotics.  If lymph node responds no further evaluation is needed.   Dehydration/mild hyponatremia: d/t nausea/vomiting.  Will give 1 L NaCl, 8 mg Zofran and 10 mg Decadron IV while in clinic. Patient appeared to feel better after fluids. BP stable.   Addendum: Patient's significant other called on 02/16/2018 stating patient was in significant pain and unable to have CT scan completed as scheduled.  He canceled his appointment.  He was advised to be seen in the emergency room ASAP for further workup.  Greater than 50% was spent in counseling and coordination of care with this patient including but not limited to discussion of the relevant topics above (See A&P) including, but not limited to diagnosis  and management of acute and chronic medical conditions.   Faythe Casa, NP 02/17/2018 12:32 PM

## 2018-02-15 NOTE — Telephone Encounter (Signed)
Nathaniel Saunders, patient is currently on adjuvant chemotherapy with gemcitabine + Xeloda. He no showed for chemotherapy apts yesterday. Multiple attempts were made to reach patient. I spoke with patient this am. -pt returning call - "He said he is on an antibiotic and has 2 more days, but has no appetite." pt reports that "his stomach fills full and hurts." pt attempted to "drink some tea this am." He states that he "is weak and has no energy."  Arranged for an apt with jenny, NP at 145 pm. Pt accepted this apt.

## 2018-02-16 ENCOUNTER — Telehealth: Payer: Self-pay | Admitting: *Deleted

## 2018-02-16 ENCOUNTER — Ambulatory Visit: Admission: RE | Admit: 2018-02-16 | Payer: Medicaid Other | Source: Ambulatory Visit

## 2018-02-16 NOTE — Telephone Encounter (Signed)
Call received from s/o reporting patient is in a lot of pain. I discussed with Sonia Baller, NP who said he was to have a [employer] today and I told her he cancelled it. She said he needs to go to the ER then for w/u. I explained this to [facility] and she said she would tell him

## 2018-02-17 ENCOUNTER — Emergency Department
Admission: EM | Admit: 2018-02-17 | Discharge: 2018-02-17 | Disposition: A | Discharge status: Home / Self Care | Payer: Medicaid Other | Attending: Emergency Medicine | Admitting: Emergency Medicine

## 2018-02-17 ENCOUNTER — Emergency Department: Payer: Medicaid Other

## 2018-02-17 ENCOUNTER — Encounter: Payer: Self-pay | Admitting: *Deleted

## 2018-02-17 ENCOUNTER — Other Ambulatory Visit: Payer: Self-pay

## 2018-02-17 DIAGNOSIS — J45909 Unspecified asthma, uncomplicated: Secondary | ICD-10-CM | POA: Insufficient documentation

## 2018-02-17 DIAGNOSIS — R1030 Lower abdominal pain, unspecified: Secondary | ICD-10-CM | POA: Diagnosis present

## 2018-02-17 DIAGNOSIS — R3129 Other microscopic hematuria: Secondary | ICD-10-CM

## 2018-02-17 DIAGNOSIS — R180 Malignant ascites: Secondary | ICD-10-CM | POA: Diagnosis not present

## 2018-02-17 DIAGNOSIS — R531 Weakness: Secondary | ICD-10-CM

## 2018-02-17 DIAGNOSIS — Z87891 Personal history of nicotine dependence: Secondary | ICD-10-CM | POA: Insufficient documentation

## 2018-02-17 DIAGNOSIS — C259 Malignant neoplasm of pancreas, unspecified: Secondary | ICD-10-CM | POA: Insufficient documentation

## 2018-02-17 DIAGNOSIS — R1084 Generalized abdominal pain: Secondary | ICD-10-CM

## 2018-02-17 LAB — COMPREHENSIVE METABOLIC PANEL
ALBUMIN: 4.1 g/dL (ref 3.5–5.0)
ALT: 19 U/L (ref 0–44)
AST: 27 U/L (ref 15–41)
Alkaline Phosphatase: 57 U/L (ref 38–126)
Anion gap: 7 (ref 5–15)
BUN: 9 mg/dL (ref 6–20)
CHLORIDE: 104 mmol/L (ref 98–111)
CO2: 27 mmol/L (ref 22–32)
Calcium: 9.3 mg/dL (ref 8.9–10.3)
Creatinine, Ser: 0.89 mg/dL (ref 0.61–1.24)
GFR calc Af Amer: >60 mL/min (ref >60)
GFR calc non Af Amer: >60 mL/min (ref >60)
GLUCOSE: 99 mg/dL (ref 70–99)
POTASSIUM: 4 mmol/L (ref 3.5–5.1)
Sodium: 138 mmol/L (ref 135–145)
Total Bilirubin: 0.9 mg/dL (ref 0.3–1.2)
Total Protein: 7.4 g/dL (ref 6.5–8.1)

## 2018-02-17 LAB — URINALYSIS, COMPLETE (UACMP) WITH MICROSCOPIC
BACTERIA UA: NONE SEEN
Bilirubin Urine: NEGATIVE
Glucose, UA: NEGATIVE mg/dL
Ketones, ur: NEGATIVE mg/dL
Leukocytes, UA: NEGATIVE
Nitrite: NEGATIVE
PROTEIN: NEGATIVE mg/dL
RBC / HPF: >50 RBC/hpf — ABNORMAL HIGH (ref 0–5)
Specific Gravity, Urine: 1.013 (ref 1.005–1.030)
pH: 5 (ref 5.0–8.0)

## 2018-02-17 LAB — CBC
HEMATOCRIT: 38.8 % — AB (ref 40.0–52.0)
Hemoglobin: 13.6 g/dL (ref 13.0–18.0)
MCH: 35.9 pg — AB (ref 26.0–34.0)
MCHC: 35 g/dL (ref 32.0–36.0)
MCV: 102.6 fL — AB (ref 80.0–100.0)
Platelets: 261 10*3/uL (ref 150–440)
RBC: 3.78 MIL/uL — ABNORMAL LOW (ref 4.40–5.90)
RDW: 18.2 % — AB (ref 11.5–14.5)
WBC: 6.6 10*3/uL (ref 3.8–10.6)

## 2018-02-17 LAB — LIPASE, BLOOD: LIPASE: 17 U/L (ref 11–51)

## 2018-02-17 MED ORDER — OXYCODONE HCL 5 MG PO TABS: 5.0000 mg | ORAL_TABLET | Freq: Four times a day (QID) | ORAL | 0 refills | Status: DC | PRN

## 2018-02-17 NOTE — ED Notes (Signed)
First Nurse Note: Patient states he receives chemo - last treatment was 3 wks. Ago.  Given face mask to wear.  Here complaining of abdominal pain states his MD sent him for a CT scan yesterday but he was too weak to obtain.  Hematuria per patient.  Placed in private area of [place].

## 2018-02-17 NOTE — Discharge Instructions (Signed)
The cause for the bleeding in your urine is unclear, but there is no evidence for urinary tract infection today.  You have a small amount of fluid in your abdomen, called peritoneal fluid, which may be irritating your belly and causing you pain.  Please stop taking tramadol, as it is not helping you.  You may take

## 2018-02-17 NOTE — ED Triage Notes (Signed)
PT to ED reporting PCP sent pt for a CT scan to r/o a kidney stone. Right flank pain x 3 days. Pt had a urinalysis performed two days ago that showed blood in urine. No changes in urine per pt reports. 2 episodes of vomiting yesterday. NO NVD today. No fevers.   Pt is a currently a cancer pt. Last chemo treatment was three weeks ago.

## 2018-02-17 NOTE — ED Notes (Signed)
Pt had a urine performed on the 25th. Results in computer.

## 2018-02-17 NOTE — ED Provider Notes (Signed)
[facility] Emergency Department Provider Note  ____________________________________________  Time seen: Approximately 2:17 PM  I have reviewed the triage vital signs and the nursing notes.   HISTORY  Chief Complaint Flank Pain    HPI Nathaniel Saunders is a 47 y.o. male that is post Whipple 4 months ago, on chemo with last chemo 2 weeks ago for pancreatic cancer, presenting for lower abdominal pain.  The patient reports that 2 weeks ago he began to have a pressure and pain sensation in the lower abdomen and was told he had hematuria.  He was treated with an antibiotic, but does not know which one and has had no improvement in his symptoms.  He also has had pain over the right flank.  He denies any visible hematuria, and denies dysuria or urinary frequency.  He has had generalized weakness but no nausea vomiting or diarrhea.  The patient has had tramadol without any improvement.  The patient underwent CT imaging 9/19 with no acute findings to explain his symptoms; he did have some mild bowel wall thickening in the ascending colon that was nonspecific.  Today, the patient had a CT renal stone study which showed small to moderate amount of peritoneal fluid increased from baseline but otherwise no significant change and no evidence of renal stones.   Past Medical History:  Diagnosis Date  . Asthma   . Genetic testing 05/20/2017   Multi-Cancer panel (83 genes) @ [facility] - No pathogenic mutations detected  . Pancreatic cancer ([place])   . Pancreatic mass     Patient Active Problem List   Diagnosis Date Noted  . Community acquired pneumonia 08/06/2017  . Pneumonia 08/06/2017  . Flu 07/31/2017  . Genetic testing 05/20/2017  . Pancreatic mass   . Obstruction of bile duct   . Port-A-Cath in place 04/29/2017  . Biliary obstruction 04/29/2017  . Obstructive jaundice due to malignant neoplasm ([place]) 04/21/2017  . Malignant neoplasm of pancreas ([place])   . Extrahepatic  obstructive biliary disease   . Malignant tumor of body of pancreas ([place]) 03/24/2017    Past Surgical History:  Procedure Laterality Date  . ERCP N/A 04/21/2017   Procedure: ENDOSCOPIC RETROGRADE CHOLANGIOPANCREATOGRAPHY (ERCP);  Surgeon: Lucilla Lame, MD;  Location: [facility] ENDOSCOPY;  Service: Endoscopy;  Laterality: N/A;  . ERCP N/A 05/06/2017   Procedure: ENDOSCOPIC RETROGRADE CHOLANGIOPANCREATOGRAPHY (ERCP);  Surgeon: Lucilla Lame, MD;  Location: [facility] ENDOSCOPY;  Service: Endoscopy;  Laterality: N/A;  . PANCREATECTOMY, PROXIMAL SUBTOTAL WITH TOTAL DUODENECTOMY, PARTIAL GASTRECTOMY, CHOLEDOCHOENTEROSTOMY AND GASTROJEJUNOSTOMY (WHIPPLE-TYPE PROCEDURE); WITHOUT PANCREATOJEJUNOSTOMY  09/06/2017  . PORTA CATH INSERTION N/A 04/18/2017   Procedure: PORTA CATH INSERTION;  Surgeon: Algernon Huxley, MD;  Location: [facility] CV LAB;  Service: Cardiovascular;  Laterality: N/A;    Current Outpatient Rx  . Order #: [id number] Class: Normal  . Order #: [id number] Class: Historical Med  . Order #: [id number] Class: Historical Med  . Order #: [id number] Class: Normal  . Order #: [id number] Class: Normal  . Order #: [id number] Class: Normal  . Order #: [id number] Class: Normal  . Order #: [id number] Class: Print  . Order #: [id number] Class: Print  . Order #: [id number] Class: Historical Med  . Order #: [id number] Class: Normal    Allergies Patient has no known allergies.  Family History  Problem Relation Age of Onset  . Pancreatic cancer Mother 49       deceased 39  . Diabetes Father   . Colon cancer Maternal Grandmother 63       deceased 50  Social History Social History   Tobacco Use  . Smoking status: Former Smoker    Packs/day: 0.25    Years: 5.00    Pack years: 1.25    Types: Cigarettes    Last attempt to quit: 05/07/2015    Years since quitting: 2.7  . Smokeless tobacco: Never Used  Substance Use Topics  . Alcohol use: No  . Drug use: No    Review of Systems Constitutional: No  fever/chills.  No lightheadedness or syncope.  As of generalized weakness. Eyes: No visual changes. ENT: No sore throat. No congestion or rhinorrhea. Cardiovascular: Denies chest pain. Denies palpitations. Respiratory: Denies shortness of breath.  No cough. Gastrointestinal: Positive for lower abdominal pain.  No nausea, no vomiting.  No diarrhea.  No constipation. Genitourinary: Negative for dysuria.  Scopic hematuria without gross hematuria.  No urinary frequency. Musculoskeletal: Positive for right flank pain. Skin: Negative for rash. Neurological: Negative for headaches. No focal numbness, tingling or weakness.     ____________________________________________   PHYSICAL EXAM:  VITAL SIGNS: ED Triage Vitals  Enc Vitals Group     BP 02/17/18 1111 110/87     Pulse Rate 02/17/18 1111 67     Resp 02/17/18 1111 16     Temp 02/17/18 1111 98.8 F (37.1 C)     Temp Source 02/17/18 1111 Oral     SpO2 02/17/18 1111 98 %     Weight 02/17/18 1112 130 lb 1.1 oz (59 kg)     Height 02/17/18 1112 5\' 3"  (1.6 m)     Head Circumference --      Peak Flow --      Pain Score 02/17/18 1111 8     Pain Loc --      Pain Edu? --      Excl. in [place]? --     Constitutional: Alert and oriented. Answers questions appropriately.  Chronically ill-appearing. Eyes: Conjunctivae are normal.  EOMI. No scleral icterus. Head: Atraumatic. Nose: No congestion/rhinnorhea. Mouth/Throat: Mucous membranes are moist.  Neck: No stridor.  Supple.  No JVD.  No meningismus. Cardiovascular: Normal rate, regular rhythm. No murmurs, rubs or gallops.  Respiratory: Normal respiratory effort.  No accessory muscle use or retractions. Lungs CTAB.  No wheezes, rales or ronchi. Gastrointestinal: Soft, and nondistended.  No guarding or rebound.  No peritoneal signs.  Diffuse nonfocal tenderness to palpation in all of the patient's abdomen except for the right lower quadrant.  Negative Murphy sign. Musculoskeletal: No LE edema.  Neurologic:  A&Ox3.  Speech is clear.  Face and smile are symmetric.  EOMI.  Moves all extremities well. Skin:  Skin is warm, dry and intact. No rash noted. Psychiatric: Mood and affect are normal. Speech and behavior are normal.  Normal judgement.  ____________________________________________   LABS (all labs ordered are listed, but only abnormal results are displayed)  Labs Reviewed  CBC - Abnormal; Notable for the following components:      Result Value   RBC 3.78 (*)    HCT 38.8 (*)    MCV 102.6 (*)    MCH 35.9 (*)    RDW 18.2 (*)    All other components within normal limits  URINALYSIS, COMPLETE (UACMP) WITH MICROSCOPIC - Abnormal; Notable for the following components:   Color, Urine YELLOW (*)    APPearance CLOUDY (*)    Hgb urine dipstick MODERATE (*)    RBC / HPF >50 (*)    All other components within normal limits  LIPASE, BLOOD  COMPREHENSIVE METABOLIC PANEL   ____________________________________________  EKG  Not indicated ____________________________________________  RADIOLOGY  Ct Renal Stone Study  Result Date: 02/17/2018 CLINICAL DATA:  Right flank pain for the past 3 days. Hematuria. Undergoing chemotherapy for pancreatic cancer. EXAM: CT ABDOMEN AND PELVIS WITHOUT CONTRAST TECHNIQUE: Multidetector CT imaging of the abdomen and pelvis was performed following the standard protocol without IV contrast. COMPARISON:  02/09/2018. FINDINGS: Lower chest: Stable bibasilar scarring. Hepatobiliary: The previously demonstrated intrahepatic biliary air is no longer demonstrated. Cholecystectomy clips are again noted. Probable duodenum extending into the cholecystectomy bed, containing small locules of air, similar to the previous examination. Pancreas: Surgical absence of the head of the pancreas with associated surgical clips. The remainder the pancreas is grossly unremarkable. Spleen: Normal in size without focal abnormality. Adrenals/Urinary Tract: Previously demonstrated  small right renal cyst. Unremarkable adrenal glands, left kidney, urinary bladder and visualized portions of the ureters. Stomach/Bowel: Suboptimal due to the lack of intravenous and oral contrast and lack of intraperitoneal fat. Grossly normal stomach, small bowel and colon. No evidence of appendicitis. Vascular/Lymphatic: Mild atheromatous arterial calcifications. No enlarged lymph nodes. Reproductive: Poorly visualized, grossly unremarkable prostate gland. Other: Small to moderate amount of free peritoneal fluid, increased. Musculoskeletal: Mild-to-moderate thoracolumbar spine degenerative changes. Mild bilateral hip degenerative changes. IMPRESSION: 1. Small to moderate amount of free peritoneal fluid, increased. 2. Otherwise, no significant change, as described above. Electronically Signed   By: Claudie Revering M.D.   On: 02/17/2018 13:05    ____________________________________________   PROCEDURES  Procedure(s) performed: None  Procedures  Critical Care performed: No ____________________________________________   INITIAL IMPRESSION / ASSESSMENT AND PLAN / ED COURSE  Pertinent labs & imaging results that were available during my care of the patient were reviewed by me and considered in my medical decision making (see chart for details).  47 y.o. male status post Whipple and currently on chemo for pancreatic cancer presenting for ongoing lower abdominal pain; on my exam his pain is diffuse except for in the right lower quadrant.  Overall, the patient is hemodynamically stable and afebrile here.  His electrolytes are reassuring, his white blood cell count is normal at 6.6, and his lipase is also normal.  His hemoglobin and hematocrit are also stable.  He is not thrombocytopenic, although has been so in the past.  His repeat CT scan does not show stones or other causes; however, he does have some increased peritoneal fluid, which could be causing some localized irritation causing the pain.  At  this time, the patient has no evidence of an acute surgical abdominal condition, or severe infectious etiology.  I am awaiting the results of his UA to rule out UTI, and we will treat him with antibiotics if he does have one.  However, the plan will be for discharge home with a pain management plan to treat his symptoms, and instructions for close follow-up with his oncology team.  Return precautions were discussed.  ----------------------------------------- 3:23 PM on 02/17/2018 -----------------------------------------  The patient's urinalysis show microscopic hematuria but no evidence of UTI.  I will plan to discharge the patient home with instructions to take Tylenol or Motrin for mild to moderate pain and oxycodone as needed for severe pain.  I have made a phone call to the oncologist on call to let them know the results of the patient's work-up today.  Plan discharge at this time.  Follow-up instructions as well as return precautions were discussed.  ____________________________________________  FINAL CLINICAL IMPRESSION(S) / ED DIAGNOSES  Final diagnoses:  Generalized abdominal pain  Generalized weakness  Microscopic hematuria  Malignant ascites         NEW MEDICATIONS STARTED DURING THIS VISIT:  New Prescriptions   OXYCODONE (ROXICODONE) 5 MG IMMEDIATE RELEASE TABLET    Take 1 tablet (5 mg total) by mouth every 6 (six) hours as needed for severe pain.      Eula Listen, MD 02/17/18 [id number]

## 2018-02-21 ENCOUNTER — Inpatient Hospital Stay: Payer: Medicaid Other

## 2018-02-21 ENCOUNTER — Inpatient Hospital Stay: Payer: Medicaid Other | Attending: Internal Medicine | Admitting: Internal Medicine

## 2018-02-21 ENCOUNTER — Encounter: Payer: Self-pay | Admitting: Internal Medicine

## 2018-02-21 VITALS — BP 125/82 | HR 66 | Temp 96.3°F | Resp 18 | Wt 125.2 lb

## 2018-02-21 DIAGNOSIS — J8410 Pulmonary fibrosis, unspecified: Secondary | ICD-10-CM

## 2018-02-21 DIAGNOSIS — Z800 Family history of malignant neoplasm of digestive organs: Secondary | ICD-10-CM | POA: Diagnosis not present

## 2018-02-21 DIAGNOSIS — R188 Other ascites: Secondary | ICD-10-CM | POA: Diagnosis not present

## 2018-02-21 DIAGNOSIS — R109 Unspecified abdominal pain: Secondary | ICD-10-CM | POA: Diagnosis not present

## 2018-02-21 DIAGNOSIS — I81 Portal vein thrombosis: Secondary | ICD-10-CM | POA: Insufficient documentation

## 2018-02-21 DIAGNOSIS — R634 Abnormal weight loss: Secondary | ICD-10-CM | POA: Insufficient documentation

## 2018-02-21 DIAGNOSIS — R180 Malignant ascites: Secondary | ICD-10-CM | POA: Insufficient documentation

## 2018-02-21 DIAGNOSIS — C251 Malignant neoplasm of body of pancreas: Secondary | ICD-10-CM

## 2018-02-21 DIAGNOSIS — Z79899 Other long term (current) drug therapy: Secondary | ICD-10-CM | POA: Diagnosis not present

## 2018-02-21 DIAGNOSIS — Z7901 Long term (current) use of anticoagulants: Secondary | ICD-10-CM | POA: Insufficient documentation

## 2018-02-21 DIAGNOSIS — Z87891 Personal history of nicotine dependence: Secondary | ICD-10-CM | POA: Diagnosis not present

## 2018-02-21 DIAGNOSIS — K5900 Constipation, unspecified: Secondary | ICD-10-CM

## 2018-02-21 DIAGNOSIS — J45909 Unspecified asthma, uncomplicated: Secondary | ICD-10-CM | POA: Diagnosis not present

## 2018-02-21 DIAGNOSIS — Z5111 Encounter for antineoplastic chemotherapy: Secondary | ICD-10-CM | POA: Insufficient documentation

## 2018-02-21 LAB — CBC WITH DIFFERENTIAL/PLATELET
BASOS PCT: 1 %
Basophils Absolute: 0.1 10*3/uL (ref 0–0.1)
EOS ABS: 0.1 10*3/uL (ref 0–0.7)
Eosinophils Relative: 1 %
HEMATOCRIT: 37.8 % — AB (ref 40.0–52.0)
HEMOGLOBIN: 13.1 g/dL (ref 13.0–18.0)
LYMPHS ABS: 1.5 10*3/uL (ref 1.0–3.6)
Lymphocytes Relative: 23 %
MCH: 34.9 pg — AB (ref 26.0–34.0)
MCHC: 34.5 g/dL (ref 32.0–36.0)
MCV: 101.1 fL — ABNORMAL HIGH (ref 80.0–100.0)
MONO ABS: 0.7 10*3/uL (ref 0.2–1.0)
MONOS PCT: 11 %
NEUTROS ABS: 4 10*3/uL (ref 1.4–6.5)
Neutrophils Relative %: 64 %
Platelets: 259 10*3/uL (ref 150–440)
RBC: 3.74 MIL/uL — ABNORMAL LOW (ref 4.40–5.90)
RDW: 16.8 % — AB (ref 11.5–14.5)
WBC: 6.3 10*3/uL (ref 3.8–10.6)

## 2018-02-21 MED ORDER — HEPARIN SOD (PORK) LOCK FLUSH 100 UNIT/ML IV SOLN
500.0000 [IU] | Freq: Once | INTRAVENOUS | Status: AC
  Administered 2018-02-21: 500 [IU] via INTRAVENOUS

## 2018-02-21 MED ORDER — SODIUM CHLORIDE 0.9% FLUSH
10.0000 mL | INTRAVENOUS | Status: DC | PRN
  Administered 2018-02-21: 10 mL via INTRAVENOUS
  Filled 2018-02-21: qty 10

## 2018-02-21 NOTE — Progress Notes (Signed)
[place] OFFICE PROGRESS NOTE  Patient Care Team: Maeola Sarah, [name], 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; [name]; [name]; [name]; [name]; [name];   # Stomach- H.Pylori [[facility]] S/p prevpack [may 2019]   ---------------------------------------------------    # Nov 2018-biliary obstruction status post ERCP; [name]; [name]; [name]; [name], [name], [name], and PALB2 c.1655A>G (p.Gln552Arg. This is still considered a normal result.  # Molecular testing Not done -------------------------------------------------   DIAGNOSIS: [ ]  Pancreatic cancer  STAGE:  II       ;GOALS: Curative  CURRENT/MOST RECENT THERAPY- Gem-Cap [June 3rd]          Malignant tumor of body of pancreas ([place])   10/11/2017 -  Chemotherapy    The patient had gemcitabine (GEMZAR) 1,600 mg in sodium chloride 0.9 % 250 mL chemo infusion, 1,672 mg, Intravenous,  Once, 4 of 6 cycles Dose modification: 900 mg/m2 (original dose 1,000 mg/m2, Cycle 3, Reason: Provider Judgment), 800 mg/m2 (original dose 1,000 mg/m2, Cycle 4, Reason: Provider Judgment) Administration: 1,600 mg (10/24/2017), 1,600 mg (11/07/2017), 1,600 mg (11/21/2017), 1,600 mg (11/28/2017), 1,400 mg (12/26/2017), 1,400 mg (01/02/2018), 1,400 mg (01/09/2018)  for chemotherapy treatment.        INTERVAL HISTORY:  Nathaniel Saunders 47 y.o.  male pleasant patient above history of stage II pancreatic cancer status post Whipple's is currently on adjuvant gemcitabine-Xeloda is here for follow-up.  Patient interim notes to have worsening abdominal pain 8-9 a scale of 10 left lower quadrant rating the pelvis.  He has been work-up with a CT scan of the abdomen pelvis-that showed no acute process; except for mild possible colitis.  He was treated with ciprofloxacin and Flagyl.   Given the lack of improvement of pain-patient also had a CT urogram-that was again negative for any acute process; incidentally noted to have increasing mild to moderate pelvic ascites.  Positive for constipation.  Last bowel movement 2 or 3 days ago.  Patient is currently on oxycodone for pain.  Review of Systems  Constitutional: Positive for weight loss. Negative for chills, diaphoresis, fever and malaise/fatigue.  HENT: Negative for nosebleeds and sore throat.   Eyes: Negative for double vision.  Respiratory: Negative for cough, hemoptysis, sputum production, shortness of breath and wheezing.   Cardiovascular: Negative for chest pain, palpitations, orthopnea and leg swelling.  Gastrointestinal: Positive for abdominal pain, nausea and vomiting. Negative for blood in stool, constipation, heartburn and melena.  Genitourinary: Negative for dysuria, frequency and urgency.  Musculoskeletal: Negative for back pain and joint pain.  Skin: Negative.  Negative for itching and rash.  Neurological: Negative for dizziness, tingling, focal weakness, weakness and headaches.  Endo/Heme/Allergies: Does not bruise/bleed easily.  Psychiatric/Behavioral: Negative for depression. The patient is not nervous/anxious and does not have insomnia.       PAST MEDICAL HISTORY :  Past Medical History:  Diagnosis Date  . Asthma   . Genetic testing 05/20/2017  Multi-Cancer panel (83 genes) @ [facility] - No pathogenic mutations  detected  . Pancreatic cancer ([place])   . Pancreatic mass     PAST SURGICAL HISTORY :   Past Surgical History:  Procedure Laterality Date  . ERCP N/A 04/21/2017   Procedure: ENDOSCOPIC RETROGRADE CHOLANGIOPANCREATOGRAPHY (ERCP);  Surgeon: Lucilla Lame, MD;  Location: [facility] ENDOSCOPY;  Service: Endoscopy;  Laterality: N/A;  . ERCP N/A 05/06/2017   Procedure: ENDOSCOPIC RETROGRADE CHOLANGIOPANCREATOGRAPHY (ERCP);  Surgeon: Lucilla Lame, MD;  Location: [facility] ENDOSCOPY;  Service: Endoscopy;  Laterality: N/A;  . PANCREATECTOMY, PROXIMAL SUBTOTAL WITH TOTAL DUODENECTOMY, PARTIAL GASTRECTOMY, CHOLEDOCHOENTEROSTOMY AND GASTROJEJUNOSTOMY (WHIPPLE-TYPE PROCEDURE); WITHOUT PANCREATOJEJUNOSTOMY  09/06/2017  . PORTA CATH INSERTION N/A 04/18/2017   Procedure: PORTA CATH INSERTION;  Surgeon: Algernon Huxley, MD;  Location: [place] CV LAB;  Service: Cardiovascular;  Laterality: N/A;    FAMILY HISTORY :   Family History  Problem Relation Age of Onset  . Pancreatic cancer Mother 5       deceased 39  . Diabetes Father   . Colon cancer Maternal Grandmother 70       deceased 18    SOCIAL HISTORY:   Social History   Tobacco Use  . Smoking status: Former Smoker    Packs/day: 0.25    Years: 5.00    Pack years: 1.25    Types: Cigarettes    Last attempt to quit: 05/07/2015    Years since quitting: 2.8  . Smokeless tobacco: Never Used  Substance Use Topics  . Alcohol use: No  . Drug use: No    ALLERGIES:  has No Known Allergies.  MEDICATIONS:  Current Outpatient Medications  Medication Sig Dispense Refill  . acetaminophen (TYLENOL) 325 MG tablet Take 2 tablets (650 mg total) by mouth every 6 (six) hours as needed for mild pain or fever (headache). 20 tablet 0  . albuterol (PROVENTIL HFA;VENTOLIN HFA) 108 (90 Base) MCG/ACT inhaler Inhale 1-2 puffs every 6 (six) hours as needed into the lungs for wheezing or shortness of breath.    . Ascorbic Acid (VITA-C PO) Take 1,000 mg by mouth daily.    .  metroNIDAZOLE (FLAGYL) 500 MG tablet Take 1 tablet (500 mg total) by mouth 3 (three) times daily. 30 tablet 0  . oxyCODONE (ROXICODONE) 5 MG immediate release tablet Take 1 tablet (5 mg total) by mouth every 6 (six) hours as needed for severe pain. 20 tablet 0  . pantoprazole (PROTONIX) 40 MG tablet Take 1 tablet by mouth daily.    . polyethylene glycol (MIRALAX / GLYCOLAX) packet Take 17 g by mouth daily as needed.    . sucralfate (CARAFATE) 1 g tablet Take 1 tablet (1 g total) by mouth 4 (four) times daily. 90 tablet 1  . capecitabine (XELODA) 150 MG tablet Take 2 tablets (300 mg total) by mouth 2 (two) times daily after a meal. Take with two 557m tablets. (Patient not taking: Reported on 02/15/2018) 12 tablet 0  . capecitabine (XELODA) 500 MG tablet Take 2 tablets (1,000 mg total) by mouth 2 (two) times daily after a meal. Take with two 1571mtablets. (Patient not taking: Reported on 02/15/2018) 12 tablet 0  . ondansetron (ZOFRAN) 8 MG tablet One pill every 8 hours as needed for nausea/vomitting. (Patient not taking: Reported on 02/21/2018) 30 tablet 0   No current facility-administered medications for this visit.    Facility-Administered Medications Ordered in Other Visits  Medication Dose Route Frequency Provider Last Rate Last Dose  . sodium chloride flush (  NS) 0.9 % injection 10 mL  10 mL Intravenous PRN Cammie Sickle, MD   10 mL at 06/15/17 0941    PHYSICAL EXAMINATION: ECOG PERFORMANCE STATUS: 0 - Asymptomatic  BP 125/82 (BP Location: Left Arm, Patient Position: Sitting)   Pulse 66   Temp (!) 96.3 F (35.7 C) (Tympanic)   Resp 18   Wt 125 lb 3.5 oz (56.8 kg)   BMI 22.18 kg/m   Filed Weights   02/21/18 1434  Weight: 125 lb 3.5 oz (56.8 kg)    Physical Exam  Constitutional: He is oriented to person, place, and time.  Patient is alone.  He is in mild distress because of abdominal pain.  HENT:  Head: Normocephalic and atraumatic.  Mouth/Throat: Oropharynx is clear and  moist. No oropharyngeal exudate.  Eyes: Pupils are equal, round, and reactive to light.  Neck: Normal range of motion. Neck supple.  Cardiovascular: Normal rate and regular rhythm.  Pulmonary/Chest: No respiratory distress. He has no wheezes.  Abdominal: Soft. Bowel sounds are normal. He exhibits no distension and no mass. There is tenderness. There is no rebound and no guarding.  No rigidity or guarding.  Abdominal tenderness generalized more so in the right lower quadrant.  Musculoskeletal: Normal range of motion. He exhibits no edema or tenderness.  Neurological: He is alert and oriented to person, place, and time.  Skin: Skin is warm.  Psychiatric: Affect normal.       LABORATORY DATA:  I have reviewed the data as listed    Component Value Date/Time   NA 138 02/17/2018 1120   K 4.0 02/17/2018 1120   CL 104 02/17/2018 1120   CO2 27 02/17/2018 1120   GLUCOSE 99 02/17/2018 1120   BUN 9 02/17/2018 1120   CREATININE 0.89 02/17/2018 1120   CALCIUM 9.3 02/17/2018 1120   PROT 7.4 02/17/2018 1120   ALBUMIN 4.1 02/17/2018 1120   AST 27 02/17/2018 1120   ALT 19 02/17/2018 1120   ALKPHOS 57 02/17/2018 1120   BILITOT 0.9 02/17/2018 1120   GFRNONAA >60 02/17/2018 1120   GFRAA >60 02/17/2018 1120    No results found for: SPEP, UPEP  Lab Results  Component Value Date   WBC 6.3 02/21/2018   NEUTROABS 4.0 02/21/2018   HGB 13.1 02/21/2018   HCT 37.8 (L) 02/21/2018   MCV 101.1 (H) 02/21/2018   PLT 259 02/21/2018      Chemistry      Component Value Date/Time   NA 138 02/17/2018 1120   K 4.0 02/17/2018 1120   CL 104 02/17/2018 1120   CO2 27 02/17/2018 1120   BUN 9 02/17/2018 1120   CREATININE 0.89 02/17/2018 1120      Component Value Date/Time   CALCIUM 9.3 02/17/2018 1120   ALKPHOS 57 02/17/2018 1120   AST 27 02/17/2018 1120   ALT 19 02/17/2018 1120   BILITOT 0.9 02/17/2018 1120       RADIOGRAPHIC STUDIES: I have personally reviewed the radiological images as  listed and agreed with the findings in the report. No results found.   ASSESSMENT & PLAN:  Malignant tumor of body of pancreas ([place]) # Pancreatic adenocarcinoma stage II s/p Whipple's. Currently on adjuvant chemotherapy with gemcitabine-Xeloda.  Chemotherapy currently on hold because of worsening abdominal pain [see discussion below]  # Continue to hold chemotherapy-gemcitabine Xeloda/see below.  # Worsening abdominal pain left lower quadrant/pelvic pain worsening-CT abdomen pelvis negative for any acute process; CT urogram-no acute process but showed increasing mild to  moderate pelvic ascitic fluid.  Will discuss with radiology; also reach out to Dr. Zenia Resides at [facility].  Clinically suspect recurrent malignancy.  #Constipation-second to narcotics.  Continue MiraLAX.  Increase fluid intake.  # Follow up in 1 week/MD   No orders of the defined types were placed in this encounter.  All questions were answered. The patient knows to call the clinic with any problems, questions or concerns.      Cammie Sickle, MD 02/22/2018 12:25 PM

## 2018-02-21 NOTE — Progress Notes (Signed)
Pt in for follow up and treatment today.  Reports having "mid to left abdominal pain and lower back pain".  Pt using pain med with relief but having difficulty with constipation.

## 2018-02-21 NOTE — Assessment & Plan Note (Addendum)
#   Pancreatic adenocarcinoma stage II s/p Whipple's. Currently on adjuvant chemotherapy with gemcitabine-Xeloda.  Chemotherapy currently on hold because of worsening abdominal pain [see discussion below]  # Continue to hold chemotherapy-gemcitabine Xeloda/see below.  # Worsening abdominal pain left lower quadrant/pelvic pain worsening-CT abdomen pelvis negative for any acute process; CT urogram-no acute process but showed increasing mild to moderate pelvic ascitic fluid.  Will discuss with radiology; also reach out to Dr. Zenia Resides at [facility].  Clinically suspect recurrent malignancy.  #Constipation-second to narcotics.  Continue MiraLAX.  Increase fluid intake.  # Follow up in 1 week/MD

## 2018-02-22 ENCOUNTER — Telehealth: Payer: Self-pay | Admitting: *Deleted

## 2018-02-22 NOTE — Telephone Encounter (Signed)
Contacted patient to f/u on s/s/ of abdominal pain. Left vm - requesting patient to return my phone call.

## 2018-02-28 ENCOUNTER — Inpatient Hospital Stay: Payer: Medicaid Other | Admitting: Internal Medicine

## 2018-02-28 NOTE — Assessment & Plan Note (Deleted)
#   Pancreatic adenocarcinoma stage II s/p Whipple's. Currently on adjuvant chemotherapy with gemcitabine-Xeloda.  Chemotherapy currently on hold because of worsening abdominal pain [see discussion below]  # Continue to hold chemotherapy-gemcitabine Xeloda/see below.  # Worsening abdominal pain left lower quadrant/pelvic pain worsening-CT abdomen pelvis negative for any acute process; CT urogram-no acute process but showed increasing mild to moderate pelvic ascitic fluid.  Will discuss with radiology; also reach out to Dr. Zenia Resides at [facility].  Clinically suspect recurrent malignancy.  #Constipation-second to narcotics.  Continue MiraLAX.  Increase fluid intake.  # Follow up in 1 week/MD

## 2018-02-28 NOTE — Progress Notes (Deleted)
[place] OFFICE PROGRESS NOTE  Patient Care Team: Maeola Sarah, [name], 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; [name]; [name]; [name]; [name]; [name];   # Stomach- H.Pylori [[facility]] S/p prevpack [may 2019]   ---------------------------------------------------    # Nov 2018-biliary obstruction status post ERCP; [name]; [name]; [name]; [name], [name], [name], and PALB2 c.1655A>G (p.Gln552Arg. This is still considered a normal result.  # Molecular testing Not done -------------------------------------------------   DIAGNOSIS: [ ]  Pancreatic cancer  STAGE:  II       ;GOALS: Curative  CURRENT/MOST RECENT THERAPY- Gem-Cap [June 3rd]          Malignant tumor of body of pancreas ([place])   10/11/2017 -  Chemotherapy    The patient had gemcitabine (GEMZAR) 1,600 mg in sodium chloride 0.9 % 250 mL chemo infusion, 1,672 mg, Intravenous,  Once, 4 of 6 cycles Dose modification: 900 mg/m2 (original dose 1,000 mg/m2, Cycle 3, Reason: Provider Judgment), 800 mg/m2 (original dose 1,000 mg/m2, Cycle 4, Reason: Provider Judgment) Administration: 1,600 mg (10/24/2017), 1,600 mg (11/07/2017), 1,600 mg (11/21/2017), 1,600 mg (11/28/2017), 1,400 mg (12/26/2017), 1,400 mg (01/02/2018), 1,400 mg (01/09/2018)  for chemotherapy treatment.        INTERVAL HISTORY:  Nathaniel Saunders 47 y.o.  male pleasant patient above history of stage II pancreatic cancer status post Whipple's is currently on adjuvant gemcitabine-Xeloda is here for follow-up.  Patient interim notes to have worsening abdominal pain 8-9 a scale of 10 left lower quadrant rating the pelvis.  He has been work-up with a CT scan of the abdomen pelvis-that showed no acute process; except for mild possible colitis.  He was treated with ciprofloxacin and Flagyl.   Given the lack of improvement of pain-patient also had a CT urogram-that was again negative for any acute process; incidentally noted to have increasing mild to moderate pelvic ascites.  Positive for constipation.  Last bowel movement 2 or 3 days ago.  Patient is currently on oxycodone for pain.  Review of Systems  Constitutional: Positive for weight loss. Negative for chills, diaphoresis, fever and malaise/fatigue.  HENT: Negative for nosebleeds and sore throat.   Eyes: Negative for double vision.  Respiratory: Negative for cough, hemoptysis, sputum production, shortness of breath and wheezing.   Cardiovascular: Negative for chest pain, palpitations, orthopnea and leg swelling.  Gastrointestinal: Positive for abdominal pain, nausea and vomiting. Negative for blood in stool, constipation, heartburn and melena.  Genitourinary: Negative for dysuria, frequency and urgency.  Musculoskeletal: Negative for back pain and joint pain.  Skin: Negative.  Negative for itching and rash.  Neurological: Negative for dizziness, tingling, focal weakness, weakness and headaches.  Endo/Heme/Allergies: Does not bruise/bleed easily.  Psychiatric/Behavioral: Negative for depression. The patient is not nervous/anxious and does not have insomnia.       PAST MEDICAL HISTORY :  Past Medical History:  Diagnosis Date  . Asthma   . Genetic testing 05/20/2017  Multi-Cancer panel (83 genes) @ [facility] - No pathogenic mutations  detected  . Pancreatic cancer ([place])   . Pancreatic mass     PAST SURGICAL HISTORY :   Past Surgical History:  Procedure Laterality Date  . ERCP N/A 04/21/2017   Procedure: ENDOSCOPIC RETROGRADE CHOLANGIOPANCREATOGRAPHY (ERCP);  Surgeon: Lucilla Lame, MD;  Location[facility] ENDOSCOPY;  Service: Endoscopy;  Laterality: N/A;  . ERCP N/A 05/06/2017   Procedure: ENDOSCOPIC RETROGRADE CHOLANGIOPANCREATOGRAPHY (ERCP);  Surgeon: Lucilla Lame, MD;  Location: [facility] ENDOSCOPY;  Service: Endoscopy;  Laterality: N/A;  . PANCREATECTOMY, PROXIMAL SUBTOTAL WITH TOTAL DUODENECTOMY, PARTIAL GASTRECTOMY, CHOLEDOCHOENTEROSTOMY AND GASTROJEJUNOSTOMY (WHIPPLE-TYPE PROCEDURE); WITHOUT PANCREATOJEJUNOSTOMY  09/06/2017  . PORTA CATH INSERTION N/A 04/18/2017   Procedure: PORTA CATH INSERTION;  Surgeon: Algernon Huxley, MD;  Location: [place] CV LAB;  Service: Cardiovascular;  Laterality: N/A;    FAMILY HISTORY :   Family History  Problem Relation Age of Onset  . Pancreatic cancer Mother 58       deceased 74  . Diabetes Father   . Colon cancer Maternal Grandmother 70       deceased 75    SOCIAL HISTORY:   Social History   Tobacco Use  . Smoking status: Former Smoker    Packs/day: 0.25    Years: 5.00    Pack years: 1.25    Types: Cigarettes    Last attempt to quit: 05/07/2015    Years since quitting: 2.8  . Smokeless tobacco: Never Used  Substance Use Topics  . Alcohol use: No  . Drug use: No    ALLERGIES:  has No Known Allergies.  MEDICATIONS:  Current Outpatient Medications  Medication Sig Dispense Refill  . acetaminophen (TYLENOL) 325 MG tablet Take 2 tablets (650 mg total) by mouth every 6 (six) hours as needed for mild pain or fever (headache). 20 tablet 0  . albuterol (PROVENTIL HFA;VENTOLIN HFA) 108 (90 Base) MCG/ACT inhaler Inhale 1-2 puffs every 6 (six) hours as needed into the lungs for wheezing or shortness of breath.    . Ascorbic Acid (VITA-C PO) Take 1,000 mg by mouth daily.    .  capecitabine (XELODA) 150 MG tablet Take 2 tablets (300 mg total) by mouth 2 (two) times daily after a meal. Take with two 592m tablets. (Patient not taking: Reported on 02/15/2018) 12 tablet 0  . capecitabine (XELODA) 500 MG tablet Take 2 tablets (1,000 mg total) by mouth 2 (two) times daily after a meal. Take with two 1528mtablets. (Patient not taking: Reported on 02/15/2018) 12 tablet 0  . metroNIDAZOLE (FLAGYL) 500 MG tablet Take 1 tablet (500 mg total) by mouth 3 (three) times daily. 30 tablet 0  . ondansetron (ZOFRAN) 8 MG tablet One pill every 8 hours as needed for nausea/vomitting. (Patient not taking: Reported on 02/21/2018) 30 tablet 0  . oxyCODONE (ROXICODONE) 5 MG immediate release tablet Take 1 tablet (5 mg total) by mouth every 6 (six) hours as needed for severe pain. 20 tablet 0  . pantoprazole (PROTONIX) 40 MG tablet Take 1 tablet by mouth daily.    . polyethylene glycol (MIRALAX / GLYCOLAX) packet Take 17 g by mouth daily as needed.    . sucralfate (CARAFATE) 1 g tablet Take 1 tablet (1 g total) by mouth 4 (four) times daily. 90 tablet 1   No current facility-administered medications for this visit.    Facility-Administered Medications Ordered in Other Visits  Medication Dose Route Frequency Provider Last Rate Last Dose  . sodium chloride flush (  NS) 0.9 % injection 10 mL  10 mL Intravenous PRN Cammie Sickle, MD   10 mL at 06/15/17 0941    PHYSICAL EXAMINATION: ECOG PERFORMANCE STATUS: 0 - Asymptomatic  There were no vitals taken for this visit.  There were no vitals filed for this visit.  Physical Exam  Constitutional: He is oriented to person, place, and time.  Patient is alone.  He is in mild distress because of abdominal pain.  HENT:  Head: Normocephalic and atraumatic.  Mouth/Throat: Oropharynx is clear and moist. No oropharyngeal exudate.  Eyes: Pupils are equal, round, and reactive to light.  Neck: Normal range of motion. Neck supple.  Cardiovascular:  Normal rate and regular rhythm.  Pulmonary/Chest: No respiratory distress. He has no wheezes.  Abdominal: Soft. Bowel sounds are normal. He exhibits no distension and no mass. There is tenderness. There is no rebound and no guarding.  No rigidity or guarding.  Abdominal tenderness generalized more so in the right lower quadrant.  Musculoskeletal: Normal range of motion. He exhibits no edema or tenderness.  Neurological: He is alert and oriented to person, place, and time.  Skin: Skin is warm.  Psychiatric: Affect normal.       LABORATORY DATA:  I have reviewed the data as listed    Component Value Date/Time   NA 138 02/17/2018 1120   K 4.0 02/17/2018 1120   CL 104 02/17/2018 1120   CO2 27 02/17/2018 1120   GLUCOSE 99 02/17/2018 1120   BUN 9 02/17/2018 1120   CREATININE 0.89 02/17/2018 1120   CALCIUM 9.3 02/17/2018 1120   PROT 7.4 02/17/2018 1120   ALBUMIN 4.1 02/17/2018 1120   AST 27 02/17/2018 1120   ALT 19 02/17/2018 1120   ALKPHOS 57 02/17/2018 1120   BILITOT 0.9 02/17/2018 1120   GFRNONAA >60 02/17/2018 1120   GFRAA >60 02/17/2018 1120    No results found for: SPEP, UPEP  Lab Results  Component Value Date   WBC 6.3 02/21/2018   NEUTROABS 4.0 02/21/2018   HGB 13.1 02/21/2018   HCT 37.8 (L) 02/21/2018   MCV 101.1 (H) 02/21/2018   PLT 259 02/21/2018      Chemistry      Component Value Date/Time   NA 138 02/17/2018 1120   K 4.0 02/17/2018 1120   CL 104 02/17/2018 1120   CO2 27 02/17/2018 1120   BUN 9 02/17/2018 1120   CREATININE 0.89 02/17/2018 1120      Component Value Date/Time   CALCIUM 9.3 02/17/2018 1120   ALKPHOS 57 02/17/2018 1120   AST 27 02/17/2018 1120   ALT 19 02/17/2018 1120   BILITOT 0.9 02/17/2018 1120       RADIOGRAPHIC STUDIES: I have personally reviewed the radiological images as listed and agreed with the findings in the report. No results found.   ASSESSMENT & PLAN:  No problem-specific Assessment & Plan notes found for this  encounter.   No orders of the defined types were placed in this encounter.  All questions were answered. The patient knows to call the clinic with any problems, questions or concerns.      Cammie Sickle, MD 02/28/2018 7:04 AM

## 2018-03-07 ENCOUNTER — Telehealth: Payer: Self-pay | Admitting: Internal Medicine

## 2018-03-07 ENCOUNTER — Inpatient Hospital Stay (HOSPITAL_BASED_OUTPATIENT_CLINIC_OR_DEPARTMENT_OTHER): Payer: Medicaid Other | Admitting: Internal Medicine

## 2018-03-07 ENCOUNTER — Encounter: Payer: Self-pay | Admitting: Internal Medicine

## 2018-03-07 ENCOUNTER — Telehealth: Payer: Self-pay | Admitting: *Deleted

## 2018-03-07 ENCOUNTER — Other Ambulatory Visit: Payer: Self-pay

## 2018-03-07 ENCOUNTER — Inpatient Hospital Stay: Payer: Medicaid Other

## 2018-03-07 VITALS — BP 129/84 | HR 75 | Temp 97.6°F | Resp 20 | Ht 63.0 in | Wt 125.7 lb

## 2018-03-07 DIAGNOSIS — Z5111 Encounter for antineoplastic chemotherapy: Secondary | ICD-10-CM

## 2018-03-07 DIAGNOSIS — R180 Malignant ascites: Secondary | ICD-10-CM

## 2018-03-07 DIAGNOSIS — Z7901 Long term (current) use of anticoagulants: Secondary | ICD-10-CM

## 2018-03-07 DIAGNOSIS — C251 Malignant neoplasm of body of pancreas: Secondary | ICD-10-CM | POA: Diagnosis not present

## 2018-03-07 DIAGNOSIS — Z79899 Other long term (current) drug therapy: Secondary | ICD-10-CM

## 2018-03-07 DIAGNOSIS — I81 Portal vein thrombosis: Secondary | ICD-10-CM | POA: Diagnosis not present

## 2018-03-07 DIAGNOSIS — Z7189 Other specified counseling: Secondary | ICD-10-CM

## 2018-03-07 DIAGNOSIS — K5900 Constipation, unspecified: Secondary | ICD-10-CM

## 2018-03-07 DIAGNOSIS — R188 Other ascites: Secondary | ICD-10-CM

## 2018-03-07 DIAGNOSIS — J45909 Unspecified asthma, uncomplicated: Secondary | ICD-10-CM

## 2018-03-07 DIAGNOSIS — J8410 Pulmonary fibrosis, unspecified: Secondary | ICD-10-CM

## 2018-03-07 DIAGNOSIS — Z87891 Personal history of nicotine dependence: Secondary | ICD-10-CM

## 2018-03-07 DIAGNOSIS — Z800 Family history of malignant neoplasm of digestive organs: Secondary | ICD-10-CM

## 2018-03-07 LAB — COMPREHENSIVE METABOLIC PANEL
ALBUMIN: 3.6 g/dL (ref 3.5–5.0)
ALT: 28 U/L (ref 0–44)
ANION GAP: 8 (ref 5–15)
AST: 30 U/L (ref 15–41)
Alkaline Phosphatase: 77 U/L (ref 38–126)
BILIRUBIN TOTAL: 0.9 mg/dL (ref 0.3–1.2)
BUN: 14 mg/dL (ref 6–20)
CHLORIDE: 100 mmol/L (ref 98–111)
CO2: 28 mmol/L (ref 22–32)
Calcium: 8.9 mg/dL (ref 8.9–10.3)
Creatinine, Ser: 0.64 mg/dL (ref 0.61–1.24)
GFR calc Af Amer: >60 mL/min (ref >60)
GLUCOSE: 97 mg/dL (ref 70–99)
POTASSIUM: 3.9 mmol/L (ref 3.5–5.1)
Sodium: 136 mmol/L (ref 135–145)
TOTAL PROTEIN: 7.3 g/dL (ref 6.5–8.1)

## 2018-03-07 LAB — CBC WITH DIFFERENTIAL/PLATELET
Abs Immature Granulocytes: 0.01 10*3/uL (ref 0.00–0.07)
BASOS ABS: 0 10*3/uL (ref 0.0–0.1)
BASOS PCT: 0 %
EOS ABS: 0.1 10*3/uL (ref 0.0–0.5)
Eosinophils Relative: 1 %
HCT: 31.8 % — ABNORMAL LOW (ref 39.0–52.0)
Hemoglobin: 11.2 g/dL — ABNORMAL LOW (ref 13.0–17.0)
IMMATURE GRANULOCYTES: 0 %
Lymphocytes Relative: 25 %
Lymphs Abs: 1.4 10*3/uL (ref 0.7–4.0)
MCH: 33.5 pg (ref 26.0–34.0)
MCHC: 35.2 g/dL (ref 30.0–36.0)
MCV: 95.2 fL (ref 80.0–100.0)
Monocytes Absolute: 0.8 10*3/uL (ref 0.1–1.0)
Monocytes Relative: 14 %
NEUTROS PCT: 60 %
NRBC: 0 % (ref 0.0–0.2)
Neutro Abs: 3.4 10*3/uL (ref 1.7–7.7)
PLATELETS: 255 10*3/uL (ref 150–400)
RBC: 3.34 MIL/uL — ABNORMAL LOW (ref 4.22–5.81)
RDW: 13.7 % (ref 11.5–15.5)
WBC: 5.6 10*3/uL (ref 4.0–10.5)

## 2018-03-07 MED ORDER — SODIUM CHLORIDE 0.9% FLUSH
10.0000 mL | INTRAVENOUS | Status: DC | PRN
  Administered 2018-03-07: 10 mL via INTRAVENOUS
  Filled 2018-03-07: qty 10

## 2018-03-07 MED ORDER — HEPARIN SOD (PORK) LOCK FLUSH 100 UNIT/ML IV SOLN
500.0000 [IU] | Freq: Once | INTRAVENOUS | Status: AC
  Administered 2018-03-07: 500 [IU] via INTRAVENOUS

## 2018-03-07 MED ORDER — FENTANYL 50 MCG/HR TD PT72: 50.0000 ug | MEDICATED_PATCH | TRANSDERMAL | 0 refills | Status: DC

## 2018-03-07 NOTE — Progress Notes (Signed)
DISCONTINUE ON PATHWAY REGIMEN - Pancreatic     A cycle is every 28 days:     Gemcitabine      Capecitabine   **Always confirm dose/schedule in your pharmacy ordering system**  REASON: Disease Progression PRIOR TREATMENT: PANOS78: Gemcitabine 1,000 mg/m2 D1, 8, 15 + Capecitabine 830 mg/m2  BID D1-21 q28 Days to a Total of 6 Months of Chemotherapy (Combined Neoadjuvant and Adjuvant) TREATMENT RESPONSE: Progressive Disease (PD)  START ON PATHWAY REGIMEN - Pancreatic     A cycle is every 28 days:     Nab-paclitaxel (protein bound)      Gemcitabine   **Always confirm dose/schedule in your pharmacy ordering system**  Patient Characteristics: Adenocarcinoma, Metastatic Disease, First Line, PS = 0, 1 Histology: Adenocarcinoma Current evidence of distant metastases<= Yes AJCC T Category: T3 AJCC N Category: N0 AJCC M Category: M1 AJCC 8 Stage Grouping: IV Line of Therapy: First Line  Intent of Therapy: Non-Curative / Palliative Intent, Discussed with Patient

## 2018-03-07 NOTE — Telephone Encounter (Signed)
kristie-please obtain the recent CT done at [place]; uploaded in the [facility] system  Heather/Brooke-please order omniseq.  Xeloda discontinued.

## 2018-03-07 NOTE — Assessment & Plan Note (Addendum)
The# Pancreatic adenocarcinoma stage II s/p Whipple's-currently on adjuvant gemcitabine Xeloda.  Unfortunately, [facility] October 2019/ recent imaging/ascites-cytology positive suspicious for malignant cells.   # I reviewed with the patient and fianc regarding-stage IV pancreatic cancer.  Unfortunately this is incurable; and treatments are palliative.  #Recommend discontinuation of gemcitabine and Xeloda; recommend starting gemcitabine Abraxane every 2 weeks.  Discussed regarding potential side effects including but not limited to nausea vomiting diarrhea neuropathy; risk of infections etc.  #Malignant ascites-monitor closely patient will likely need further paracentesis.  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  #Pain control-poorly controlled; recommend fentanyl patch 50 mcg new patient given.  Recommend continued Percocet 1 every 6-8 hours.  #Constipation-second to narcotics.  Continue MiraLAX.  #Discussed with the patient and family regarding appointment with Dr. Zenia Resides; recommend calling the office prior to canceling.  #We will order molecular testing on pathology sample.   # Follow up in 1 week/MD- Gem-abraxane; no labs.  # 40 minutes face-to-face with the patient discussing the above plan of care; more than 50% of time spent on prognosis/ natural history; counseling and coordination.

## 2018-03-07 NOTE — Telephone Encounter (Signed)
Per Dr. Jacinto Reap - Contacted patient to f/u on [facility] admission and to schedule an apt to discuss future treatment changes. Patient agreeable to come today at 1pm to see Dr. Jacinto Reap. Per md ordered cbc, metc

## 2018-03-07 NOTE — Progress Notes (Signed)
[place] OFFICE PROGRESS NOTE  Patient Care Team: Maeola Sarah, [name], 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; [name]; [name]; [name]; [facility]]- close margins; [name];   # [name] [June 3rd to sep 3rd,2019]; progression [CT Oct 10th 2019- [facility]- ascites/1.6 lits; cytology pos-Adeno ca];    # October, 22nd 2019- Gem- Abraxane  # Stomach- H.Pylori [[facility]] S/p prevpack [may 2019];Oct 2019- Portal/Hepatic Vein Thrombosis [Eliquis] ---------------------------------------------------   # Nov 2018-biliary obstruction status post ERCP; [name]; [name]; [name]; [name], [name], [name], and PALB2 c.1655A>G (p.Gln552Arg. This is still considered a normal result.  # Molecular testing- Not done -------------------------------------------------   DIAGNOSIS:  Pancreatic cancer  STAGE:  IV       ;GOALS: pallaitive  CURRENT/MOST RECENT THERAPY- Gem-Abraxane          Malignant tumor of body of pancreas ([place])   10/11/2017 - 01/24/2018 Chemotherapy    The patient had gemcitabine (GEMZAR) 1,600 mg in sodium chloride 0.9 % 250 mL chemo infusion, 1,672 mg, Intravenous,  Once, 4 of 6 cycles Dose modification: 900 mg/m2 (original dose 1,000 mg/m2, Cycle 3, Reason: Provider Judgment), 800 mg/m2 (original dose 1,000 mg/m2, Cycle 4, Reason:  Provider Judgment) Administration: 1,600 mg (10/24/2017), 1,600 mg (11/07/2017), 1,600 mg (11/21/2017), 1,600 mg (11/28/2017), 1,400 mg (12/26/2017), 1,400 mg (01/02/2018), 1,400 mg (01/09/2018)  for chemotherapy treatment.     03/13/2018 -  Chemotherapy    The patient had gemcitabine (GEMZAR) 1,596 mg in sodium chloride 0.9 % 100 mL chemo infusion, 1,000 mg/m2 = 1,596 mg, Intravenous,  Once, 0 of 4 cycles PACLitaxel-protein bound (ABRAXANE) chemo infusion 200 mg, 125 mg/m2 = 200 mg, Intravenous, Once, 0 of 4 cycles  for chemotherapy treatment.        INTERVAL HISTORY:  Nathaniel Saunders 47 y.o.  male pleasant patient above history of T2N1 pancreatic cancer s/p Whipple in April 2019 followed by adjuvant gemcitabine/Xeloda. He presented to the emergency room on 02/25/2018 with recurrent abdominal pain. Chemotherapy was placed on hold secondary to the symptoms. He is undergone multiple CTs in the last 3 weeks with most recent CT A/P on 02/09/2018 noting free fluid but no other significant changes. He initially presented on 02/25/2018 to [facility] urgent care for continued abdominal pain for 24 hours he did not have any nausea or vomiting. CT scan performed was concerning for nonocclusive thrombus of main portal vein and right portal vein, large volume ascites, possible carcinomatosis. He was transferred to [facility] for further management.   Pt underwent ultrasound-guided paracentesis on 02/27/2018 with removal of 1.6 L of straw-colored fluid. Cytology of this fluid returned suspicious for adenocarcinoma. He was started on anticoagulation for portal vein thrombosis. Patient's pain improved. He was deemed stable for discharge to home on 02/28/2018.  Patient is here for follow-up.  Patient continues to have moderate abdominal pain.   Review of Systems  Constitutional: Positive for weight loss. Negative for chills, diaphoresis, fever and  malaise/fatigue.  HENT: Negative for nosebleeds and sore throat.   Eyes:  Negative for double vision.  Respiratory: Negative for cough, hemoptysis, sputum production, shortness of breath and wheezing.   Cardiovascular: Negative for chest pain, palpitations, orthopnea and leg swelling.  Gastrointestinal: Positive for abdominal pain, nausea and vomiting. Negative for blood in stool, constipation, heartburn and melena.  Genitourinary: Negative for dysuria, frequency and urgency.  Musculoskeletal: Negative for back pain and joint pain.  Skin: Negative.  Negative for itching and rash.  Neurological: Negative for dizziness, tingling, focal weakness, weakness and headaches.  Endo/Heme/Allergies: Does not bruise/bleed easily.  Psychiatric/Behavioral: Negative for depression. The patient is not nervous/anxious and does not have insomnia.       PAST MEDICAL HISTORY :  Past Medical History:  Diagnosis Date  . Asthma   . Genetic testing 05/20/2017   Multi-Cancer panel (83 genes) @ [facility] - No pathogenic mutations detected  . Pancreatic cancer ([place])   . Pancreatic mass     PAST SURGICAL HISTORY :   Past Surgical History:  Procedure Laterality Date  . ERCP N/A 04/21/2017   Procedure: ENDOSCOPIC RETROGRADE CHOLANGIOPANCREATOGRAPHY (ERCP);  Surgeon: Lucilla Lame, MD;  Location[facility] ENDOSCOPY;  Service: Endoscopy;  Laterality: N/A;  . ERCP N/A 05/06/2017   Procedure: ENDOSCOPIC RETROGRADE CHOLANGIOPANCREATOGRAPHY (ERCP);  Surgeon: Lucilla Lame, MD;  Location: [facility] ENDOSCOPY;  Service: Endoscopy;  Laterality: N/A;  . PANCREATECTOMY, PROXIMAL SUBTOTAL WITH TOTAL DUODENECTOMY, PARTIAL GASTRECTOMY, CHOLEDOCHOENTEROSTOMY AND GASTROJEJUNOSTOMY (WHIPPLE-TYPE PROCEDURE); WITHOUT PANCREATOJEJUNOSTOMY  09/06/2017  . PORTA CATH INSERTION N/A 04/18/2017   Procedure: PORTA CATH INSERTION;  Surgeon: Algernon Huxley, MD;  Location: [facility] LAB;  Service: Cardiovascular;  Laterality: N/A;    FAMILY HISTORY :   Family History  Problem Relation Age of Onset  . Pancreatic cancer  Mother 34       deceased 55  . Diabetes Father   . Colon cancer Maternal Grandmother 70       deceased 84    SOCIAL HISTORY:   Social History   Tobacco Use  . Smoking status: Former Smoker    Packs/day: 0.25    Years: 5.00    Pack years: 1.25    Types: Cigarettes    Last attempt to quit: 05/07/2015    Years since quitting: 2.8  . Smokeless tobacco: Never Used  Substance Use Topics  . Alcohol use: No  . Drug use: No    ALLERGIES:  has No Known Allergies.  MEDICATIONS:  Current Outpatient Medications  Medication Sig Dispense Refill  . acetaminophen (TYLENOL) 325 MG tablet Take 2 tablets (650 mg total) by mouth every 6 (six) hours as needed for mild pain or fever (headache). 20 tablet 0  . oxyCODONE (ROXICODONE) 5 MG immediate release tablet Take 1 tablet (5 mg total) by mouth every 6 (six) hours as needed for severe pain. 20 tablet 0  . pantoprazole (PROTONIX) 40 MG tablet Take 1 tablet by mouth daily.    Marland Kitchen senna-docusate (SENOKOT-S) 8.6-50 MG tablet Take 1 tablet by mouth 2 (two) times daily as needed for constipation.    Marland Kitchen albuterol (PROVENTIL HFA;VENTOLIN HFA) 108 (90 Base) MCG/ACT inhaler Inhale 1-2 puffs every 6 (six) hours as needed into the lungs for wheezing or shortness of breath.    . fentaNYL (DURAGESIC - DOSED MCG/HR) 50 MCG/HR Place 1 patch (50 mcg total) onto the skin every 3 (three) days. 5 patch 0  . ondansetron (ZOFRAN) 8 MG tablet One pill every 8 hours as needed  for nausea/vomitting. (Patient not taking: Reported on 02/21/2018) 30 tablet 0   No current facility-administered medications for this visit.    Facility-Administered Medications Ordered in Other Visits  Medication Dose Route Frequency Provider Last Rate Last Dose  . sodium chloride flush (NS) 0.9 % injection 10 mL  10 mL Intravenous PRN Cammie Sickle, MD   10 mL at 06/15/17 0941    PHYSICAL EXAMINATION: ECOG PERFORMANCE STATUS: 0 - Asymptomatic  BP 129/84 (Patient Position: Sitting)    Pulse 75   Temp 97.6 F (36.4 C) (Tympanic)   Resp 20   Ht 5' 3"  (1.6 m)   Wt 125 lb 10.6 oz (57 kg)   BMI 22.26 kg/m   Filed Weights   03/07/18 1331  Weight: 125 lb 10.6 oz (57 kg)    Physical Exam  Constitutional: He is oriented to person, place, and time.  Patient is accompanied by fianc . he is in mild distress because of abdominal pain.  HENT:  Head: Normocephalic and atraumatic.  Mouth/Throat: Oropharynx is clear and moist. No oropharyngeal exudate.  Eyes: Pupils are equal, round, and reactive to light.  Neck: Normal range of motion. Neck supple.  Cardiovascular: Normal rate and regular rhythm.  Pulmonary/Chest: No respiratory distress. He has no wheezes.  Abdominal: Soft. Bowel sounds are normal. He exhibits distension. He exhibits no mass. There is tenderness. There is no rebound and no guarding.  No rigidity or guarding.  Abdominal tenderness generalized more so in the right lower quadrant.  Musculoskeletal: Normal range of motion. He exhibits no edema or tenderness.  Neurological: He is alert and oriented to person, place, and time.  Skin: Skin is warm.  Psychiatric: Affect normal.       LABORATORY DATA:  I have reviewed the data as listed    Component Value Date/Time   NA 136 03/07/2018 1323   K 3.9 03/07/2018 1323   CL 100 03/07/2018 1323   CO2 28 03/07/2018 1323   GLUCOSE 97 03/07/2018 1323   BUN 14 03/07/2018 1323   CREATININE 0.64 03/07/2018 1323   CALCIUM 8.9 03/07/2018 1323   PROT 7.3 03/07/2018 1323   ALBUMIN 3.6 03/07/2018 1323   AST 30 03/07/2018 1323   ALT 28 03/07/2018 1323   ALKPHOS 77 03/07/2018 1323   BILITOT 0.9 03/07/2018 1323   GFRNONAA >60 03/07/2018 1323   GFRAA >60 03/07/2018 1323    No results found for: SPEP, UPEP  Lab Results  Component Value Date   WBC 5.6 03/07/2018   NEUTROABS 3.4 03/07/2018   HGB 11.2 (L) 03/07/2018   HCT 31.8 (L) 03/07/2018   MCV 95.2 03/07/2018   PLT 255 03/07/2018      Chemistry       Component Value Date/Time   NA 136 03/07/2018 1323   K 3.9 03/07/2018 1323   CL 100 03/07/2018 1323   CO2 28 03/07/2018 1323   BUN 14 03/07/2018 1323   CREATININE 0.64 03/07/2018 1323      Component Value Date/Time   CALCIUM 8.9 03/07/2018 1323   ALKPHOS 77 03/07/2018 1323   AST 30 03/07/2018 1323   ALT 28 03/07/2018 1323   BILITOT 0.9 03/07/2018 1323       RADIOGRAPHIC STUDIES: I have personally reviewed the radiological images as listed and agreed with the findings in the report. No results found.   ASSESSMENT & PLAN:  Malignant tumor of body of pancreas ([place]) The# Pancreatic adenocarcinoma stage II s/p Whipple's-currently on adjuvant gemcitabine Xeloda.  Unfortunately, [facility] October 2019/ recent imaging/ascites-cytology positive suspicious for malignant cells.   # I reviewed with the patient and fianc regarding-stage IV pancreatic cancer.  Unfortunately this is incurable; and treatments are palliative.  #Recommend discontinuation of gemcitabine and Xeloda; recommend starting gemcitabine Abraxane every 2 weeks.  Discussed regarding potential side effects including but not limited to nausea vomiting diarrhea neuropathy; risk of infections etc.  #Malignant ascites-monitor closely patient will likely need further paracentesis.  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  #Pain control-poorly controlled; recommend fentanyl patch 50 mcg new patient given.  Recommend continued Percocet 1 every 6-8 hours.  #Constipation-second to narcotics.  Continue MiraLAX.  #Discussed with the patient and family regarding appointment with Dr. Zenia Resides; recommend calling the office prior to canceling.  #We will order molecular testing on pathology sample.   # Follow up in 1 week/MD- Gem-abraxane; no labs.  # 40 minutes face-to-face with the patient discussing the above plan of care; more than 50% of time spent on prognosis/ natural history; counseling and  coordination.    No orders of the defined types were placed in this encounter.  All questions were answered. The patient knows to call the clinic with any problems, questions or concerns.      Cammie Sickle, MD 03/07/2018 9:50 PM

## 2018-03-08 NOTE — Telephone Encounter (Signed)
Omniseq testing form completed - pending md signature

## 2018-03-08 NOTE — Telephone Encounter (Signed)
CT performed at [facility] on 02/25/18. Request sent to [facility] imaging for powershare with fax confirmation of receipt.

## 2018-03-09 ENCOUNTER — Ambulatory Visit
Admission: RE | Admit: 2018-03-09 | Discharge: 2018-03-09 | Disposition: A | Discharge status: Home / Self Care | Payer: Self-pay | Source: Ambulatory Visit | Attending: Oncology | Admitting: Oncology

## 2018-03-09 ENCOUNTER — Other Ambulatory Visit: Payer: Self-pay | Admitting: Oncology

## 2018-03-09 DIAGNOSIS — R319 Hematuria, unspecified: Secondary | ICD-10-CM

## 2018-03-13 ENCOUNTER — Other Ambulatory Visit: Payer: Self-pay | Admitting: *Deleted

## 2018-03-13 ENCOUNTER — Other Ambulatory Visit: Payer: Self-pay | Admitting: Internal Medicine

## 2018-03-13 DIAGNOSIS — R180 Malignant ascites: Secondary | ICD-10-CM | POA: Insufficient documentation

## 2018-03-13 MED ORDER — SODIUM CHLORIDE 0.9 % IV SOLN
125.00 | INTRAVENOUS | Status: DC
Start: ? — End: 2018-03-13

## 2018-03-13 MED ORDER — OXYCODONE HCL 5 MG PO TABS: 10.0000 mg | ORAL_TABLET | Freq: Four times a day (QID) | ORAL | 0 refills | Status: DC | PRN

## 2018-03-13 NOTE — Telephone Encounter (Signed)
Patient called Nathaniel Saunders requesting refill of oxycodone.   As mandated by the [place] STOP Act (Strengthen Opioid Misuse Prevention), the [place] Controlled Substance Reporting System ([place]) was reviewed for this patient.  Below is the past 80-months of controlled substance prescriptions as displayed by the registry.  I have personally consulted with my supervising physician, Dr. Rogue Bussing, who agrees that continuation of opiate therapy is medically appropriate at this time and agrees to provide continual monitoring, including urine/blood drug screens, as indicated. Prescription sent electronically using [employer] secure transmission to requested pharmacy.   [name] Reviewed:    NCCSR review revealed inconsistencies. Discussed with patient who states that Dr. Rogue Bussing advised him to increase oxycodone to 2 tablets (10mg ) every 6 hours for pain which is how he has been taking his medication. Discussed with Dr. Rogue Bussing who recommends updating prescription and sending short course of medication as he will be seeing patient in clinic for discussion of pain control following paracentesis. Patient requests prescription be sent to [facility] which will be done.    Beckey Rutter, DNP, AGNP-C [place] at [facility] [phone] (work cell) [phone] (office) 03/13/18 4:52 PM

## 2018-03-13 NOTE — Telephone Encounter (Signed)
Omniseq form has been faxed.

## 2018-03-13 NOTE — Telephone Encounter (Signed)
Patient is miserable and went to [facility] over weekend and was told he needs a paracentesis. She is requesting we arrange for him to have a paracentesis. Please advise

## 2018-03-13 NOTE — Telephone Encounter (Signed)
Paracentesis has been scheduled for 03/14/18 at 1:30. Patient notified and verbalized understanding. Patient also notified to be in [place] on 03/15/18 at 9:00 for infusion. Patient verbalized understanding.

## 2018-03-13 NOTE — Telephone Encounter (Signed)
Order for paracentesis has been placed and order form has been faxed.

## 2018-03-14 ENCOUNTER — Ambulatory Visit: Payer: Medicaid Other

## 2018-03-14 ENCOUNTER — Inpatient Hospital Stay: Payer: Medicaid Other | Admitting: Internal Medicine

## 2018-03-14 ENCOUNTER — Ambulatory Visit
Admission: RE | Admit: 2018-03-14 | Discharge: 2018-03-14 | Disposition: A | Discharge status: Home / Self Care | Payer: Medicaid Other | Source: Ambulatory Visit | Attending: Internal Medicine | Admitting: Internal Medicine

## 2018-03-14 DIAGNOSIS — R180 Malignant ascites: Secondary | ICD-10-CM | POA: Diagnosis present

## 2018-03-14 DIAGNOSIS — C259 Malignant neoplasm of pancreas, unspecified: Secondary | ICD-10-CM | POA: Diagnosis not present

## 2018-03-15 ENCOUNTER — Inpatient Hospital Stay: Payer: Medicaid Other

## 2018-03-15 ENCOUNTER — Inpatient Hospital Stay: Payer: Medicaid Other | Admitting: Internal Medicine

## 2018-03-15 ENCOUNTER — Telehealth: Payer: Self-pay | Admitting: Internal Medicine

## 2018-03-15 NOTE — Telephone Encounter (Signed)
Called [phone] spoke to patient regarding no-show for chemotherapy feels he is tired/sore.  Patient symptoms are from progressive cancer; recommend chemotherapy ASAP.  Please set up gem Abraxane-[place] or [place] as soon as possible.   Left a message with patient's fianc at [phone] call us back.

## 2018-03-16 ENCOUNTER — Inpatient Hospital Stay (HOSPITAL_BASED_OUTPATIENT_CLINIC_OR_DEPARTMENT_OTHER): Payer: Medicaid Other | Admitting: Internal Medicine

## 2018-03-16 ENCOUNTER — Inpatient Hospital Stay: Payer: Medicaid Other

## 2018-03-16 ENCOUNTER — Other Ambulatory Visit: Payer: Self-pay

## 2018-03-16 VITALS — BP 127/92 | HR 96 | Temp 97.4°F | Resp 18 | Wt 120.6 lb

## 2018-03-16 VITALS — BP 128/89 | HR 91

## 2018-03-16 DIAGNOSIS — Z87891 Personal history of nicotine dependence: Secondary | ICD-10-CM

## 2018-03-16 DIAGNOSIS — Z79899 Other long term (current) drug therapy: Secondary | ICD-10-CM

## 2018-03-16 DIAGNOSIS — J8410 Pulmonary fibrosis, unspecified: Secondary | ICD-10-CM

## 2018-03-16 DIAGNOSIS — Z5111 Encounter for antineoplastic chemotherapy: Secondary | ICD-10-CM | POA: Diagnosis not present

## 2018-03-16 DIAGNOSIS — Z7901 Long term (current) use of anticoagulants: Secondary | ICD-10-CM

## 2018-03-16 DIAGNOSIS — Z800 Family history of malignant neoplasm of digestive organs: Secondary | ICD-10-CM

## 2018-03-16 DIAGNOSIS — R180 Malignant ascites: Secondary | ICD-10-CM

## 2018-03-16 DIAGNOSIS — C251 Malignant neoplasm of body of pancreas: Secondary | ICD-10-CM | POA: Diagnosis not present

## 2018-03-16 DIAGNOSIS — I81 Portal vein thrombosis: Secondary | ICD-10-CM | POA: Diagnosis not present

## 2018-03-16 DIAGNOSIS — K5900 Constipation, unspecified: Secondary | ICD-10-CM

## 2018-03-16 DIAGNOSIS — J45909 Unspecified asthma, uncomplicated: Secondary | ICD-10-CM

## 2018-03-16 DIAGNOSIS — R188 Other ascites: Secondary | ICD-10-CM

## 2018-03-16 LAB — CBC WITH DIFFERENTIAL/PLATELET
ABS IMMATURE GRANULOCYTES: 0.02 10*3/uL (ref 0.00–0.07)
BASOS ABS: 0 10*3/uL (ref 0.0–0.1)
BASOS PCT: 0 %
EOS PCT: 0 %
Eosinophils Absolute: 0 10*3/uL (ref 0.0–0.5)
HCT: 37 % — ABNORMAL LOW (ref 39.0–52.0)
HEMOGLOBIN: 12.7 g/dL — AB (ref 13.0–17.0)
Immature Granulocytes: 0 %
Lymphocytes Relative: 12 %
Lymphs Abs: 0.7 10*3/uL (ref 0.7–4.0)
MCH: 31.8 pg (ref 26.0–34.0)
MCHC: 34.3 g/dL (ref 30.0–36.0)
MCV: 92.7 fL (ref 80.0–100.0)
MONO ABS: 0.6 10*3/uL (ref 0.1–1.0)
Monocytes Relative: 11 %
NEUTROS ABS: 4.3 10*3/uL (ref 1.7–7.7)
NRBC: 0 % (ref 0.0–0.2)
Neutrophils Relative %: 77 %
PLATELETS: 255 10*3/uL (ref 150–400)
RBC: 3.99 MIL/uL — AB (ref 4.22–5.81)
RDW: 13.4 % (ref 11.5–15.5)
WBC: 5.7 10*3/uL (ref 4.0–10.5)

## 2018-03-16 LAB — COMPREHENSIVE METABOLIC PANEL
ALT: 55 U/L — ABNORMAL HIGH (ref 0–44)
AST: 60 U/L — ABNORMAL HIGH (ref 15–41)
Albumin: 3.4 g/dL — ABNORMAL LOW (ref 3.5–5.0)
Alkaline Phosphatase: 89 U/L (ref 38–126)
Anion gap: 6 (ref 5–15)
BILIRUBIN TOTAL: 0.9 mg/dL (ref 0.3–1.2)
BUN: 26 mg/dL — ABNORMAL HIGH (ref 6–20)
CHLORIDE: 101 mmol/L (ref 98–111)
CO2: 27 mmol/L (ref 22–32)
Calcium: 9.1 mg/dL (ref 8.9–10.3)
Creatinine, Ser: 0.62 mg/dL (ref 0.61–1.24)
Glucose, Bld: 109 mg/dL — ABNORMAL HIGH (ref 70–99)
POTASSIUM: 4.3 mmol/L (ref 3.5–5.1)
Sodium: 134 mmol/L — ABNORMAL LOW (ref 135–145)
TOTAL PROTEIN: 7.6 g/dL (ref 6.5–8.1)

## 2018-03-16 LAB — CYTOLOGY - NON PAP

## 2018-03-16 MED ORDER — SODIUM CHLORIDE 0.9 % IV SOLN
Freq: Once | INTRAVENOUS | Status: AC
  Administered 2018-03-16: 11:00:00 via INTRAVENOUS
  Filled 2018-03-16: qty 250

## 2018-03-16 MED ORDER — PROCHLORPERAZINE MALEATE 10 MG PO TABS
10.0000 mg | ORAL_TABLET | Freq: Once | ORAL | Status: AC
  Administered 2018-03-16: 10 mg via ORAL
  Filled 2018-03-16: qty 1

## 2018-03-16 MED ORDER — OXYCODONE HCL 10 MG PO TABS: 10.0000 mg | ORAL_TABLET | Freq: Four times a day (QID) | ORAL | 0 refills | Status: AC | PRN

## 2018-03-16 MED ORDER — DEXAMETHASONE SODIUM PHOSPHATE 10 MG/ML IJ SOLN
10.0000 mg | Freq: Once | INTRAMUSCULAR | Status: AC
  Administered 2018-03-16: 10 mg via INTRAVENOUS
  Filled 2018-03-16: qty 1

## 2018-03-16 MED ORDER — MORPHINE SULFATE (PF) 2 MG/ML IV SOLN
2.0000 mg | Freq: Once | INTRAVENOUS | Status: AC
  Administered 2018-03-16: 2 mg via INTRAVENOUS
  Filled 2018-03-16: qty 1

## 2018-03-16 MED ORDER — SODIUM CHLORIDE 0.9 % IV SOLN: Freq: Once | INTRAVENOUS | Status: DC

## 2018-03-16 MED ORDER — ONDANSETRON HCL 4 MG/2ML IJ SOLN
8.0000 mg | Freq: Once | INTRAMUSCULAR | Status: AC
  Administered 2018-03-16: 8 mg via INTRAVENOUS
  Filled 2018-03-16: qty 4

## 2018-03-16 MED ORDER — HEPARIN SOD (PORK) LOCK FLUSH 100 UNIT/ML IV SOLN
500.0000 [IU] | Freq: Once | INTRAVENOUS | Status: AC | PRN
  Administered 2018-03-16: 500 [IU]
  Filled 2018-03-16: qty 5

## 2018-03-16 MED ORDER — SODIUM CHLORIDE 0.9 % IV SOLN
1600.0000 mg | Freq: Once | INTRAVENOUS | Status: AC
  Administered 2018-03-16: 1600 mg via INTRAVENOUS
  Filled 2018-03-16: qty 26.3

## 2018-03-16 MED ORDER — PACLITAXEL PROTEIN-BOUND CHEMO INJECTION 100 MG
125.0000 mg/m2 | Freq: Once | Status: AC
  Administered 2018-03-16: 200 mg via INTRAVENOUS
  Filled 2018-03-16: qty 40

## 2018-03-16 MED ORDER — FENTANYL 75 MCG/HR TD PT72: 75.0000 ug | MEDICATED_PATCH | TRANSDERMAL | 0 refills | Status: AC

## 2018-03-16 NOTE — Assessment & Plan Note (Addendum)
#   Pancreatic adenocarcinoma stage -Stage IV. Recommend starting gemcitabine Abraxane every 2 weeks.  #Proceed with cycle #1 today; Labs today reviewed;  acceptable for treatment today.   # I reviewed with the patient and fianc regarding-stage IV pancreatic cancer.  Unfortunately this is incurable; and treatments are palliative.  Discussed response rates in about 20%.  # Discussed regarding potential side effects including but not limited to nausea vomiting diarrhea neuropathy; risk of infections etc.  #Malignant ascites-monitor closely patient will likely need further paracentesis; positive for malignant cells in the fluid.  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  #Pain control-poorly controlled; increase the dose of fentanyl patch to 75 mcg; also oxycodone every 6 hours.  #Constipation-second to narcotics.  Continue MiraLAX.  # molecular testing on pathology-pending.    # DISPOSITION; # today- cbc/cmp/gem-abraxane # [place]- 10/29- MD/ no labs # [place]- 11/05- labs/ MD- cbc/cmp/gem-abraxane  Addendum spoke to patient's fianc-regarding above prognosis/treatment plan.

## 2018-03-16 NOTE — Progress Notes (Signed)
Sodium 134, AST 60 and ALT 55 per Dr. Rogue Bussing okay to proceed with treatment.  Pt tolerated infusion well.  1307: Pt reports pain is improving (see flowsheets) Pt states he has a ride home, he is not driving, Per Rulon Abide NP okay to discharge pt home. Pt stable at time of discharge, and pt educated to call clinic if symptoms worsens. Pt verbalizes understanding.

## 2018-03-16 NOTE — Progress Notes (Signed)
[facility] OFFICE PROGRESS NOTE  Patient Care Team: Nathaniel Sarah, MD as PCP - General (Family Medicine) Nathaniel Jacks, 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; [name]; [name]; [name]; [facility]]- close margins; [name];   # Nathaniel Saunders [June 3rd to sep 3rd,2019]; progression [CT Oct 10th 2019- [facility]- ascites/1.6 lits; cytology pos-Adeno ca];    # October, 22nd 2019- Gem- Abraxane  # Stomach- H.Pylori [[facility]] S/p prevpack [may 2019];Oct 2019- Portal/Hepatic Vein Thrombosis [Eliquis] ---------------------------------------------------   # Nov 2018-biliary obstruction status post ERCP; [name]; [name]; [name]; [name], [name], [name], and PALB2 c.1655A>G (p.Gln552Arg. This is still considered a normal result.  # Molecular testing- Not done -------------------------------------------------   DIAGNOSIS:  Pancreatic cancer  STAGE:  IV       ;GOALS: pallaitive  CURRENT/MOST RECENT THERAPY- Gem-Abraxane          Malignant tumor of body of pancreas ([place])   10/11/2017 - 01/24/2018 Chemotherapy    The patient had gemcitabine (GEMZAR) 1,600 mg in sodium chloride 0.9 % 250 mL chemo infusion, 1,672 mg, Intravenous,  Once, 4 of 6 cycles Dose modification: 900 mg/m2 (original dose 1,000 mg/m2, Cycle 3, Reason: Provider Judgment), 800 mg/m2 (original dose 1,000 mg/m2, Cycle 4, Reason:  Provider Judgment) Administration: 1,600 mg (10/24/2017), 1,600 mg (11/07/2017), 1,600 mg (11/21/2017), 1,600 mg (11/28/2017), 1,400 mg (12/26/2017), 1,400 mg (01/02/2018), 1,400 mg (01/09/2018)  for chemotherapy treatment.     03/13/2018 -  Chemotherapy    The patient had gemcitabine (GEMZAR) 1,600 mg in sodium chloride 0.9 % 250 mL chemo infusion, 1,596 mg, Intravenous,  Once, 1 of 4 cycles Administration: 1,600 mg (03/16/2018) ondansetron (ZOFRAN) 8 mg, dexamethasone (DECADRON) 10 mg in sodium chloride 0.9 % 50 mL IVPB, , Intravenous,  Once, 1 of 4 cycles PACLitaxel-protein bound (ABRAXANE) chemo infusion 200 mg, 125 mg/m2 = 200 mg, Intravenous, Once, 1 of 4 cycles Administration: 200 mg (03/16/2018)  for chemotherapy treatment.        INTERVAL HISTORY:  Nathaniel Saunders 47 y.o.  male with recurrent/stage IV pancreatic cancer with ascites is here to proceed with gemcitabine Abraxane.  Patient interim had paracentesis-cytology positive for malignancy.  Patient continues to have poor pain control.  Losing weight.  Positive for nausea no vomiting.  Patient is currently on fentanyl patch 50 mcg; he is taking oxycodone 5 mg -2 pills every 8 hours.   Review of Systems  Constitutional: Positive for weight loss. Negative for chills, diaphoresis, fever and malaise/fatigue.  HENT: Negative for nosebleeds and sore throat.   Eyes: Negative for double vision.  Respiratory: Negative for cough, hemoptysis, sputum production, shortness of breath and wheezing.   Cardiovascular: Negative for chest pain, palpitations, orthopnea and leg swelling.  Gastrointestinal: Positive for abdominal pain, nausea and vomiting. Negative for blood in stool, constipation, heartburn and melena.  Genitourinary: Negative for dysuria, frequency and urgency.  Musculoskeletal: Negative for back pain and joint pain.  Skin: Negative.  Negative for itching and rash.  Neurological: Negative for  dizziness, tingling, focal weakness,  weakness and headaches.  Endo/Heme/Allergies: Does not bruise/bleed easily.  Psychiatric/Behavioral: Negative for depression. The patient is not nervous/anxious and does not have insomnia.       PAST MEDICAL HISTORY :  Past Medical History:  Diagnosis Date  . Asthma   . Genetic testing 05/20/2017   Multi-Cancer panel (83 genes) @ [facility] - No pathogenic mutations detected  . Pancreatic cancer ([place])   . Pancreatic mass     PAST SURGICAL HISTORY :   Past Surgical History:  Procedure Laterality Date  . ERCP N/A 04/21/2017   Procedure: ENDOSCOPIC RETROGRADE CHOLANGIOPANCREATOGRAPHY (ERCP);  Surgeon: Lucilla Lame, MD;  Location: [facility] ENDOSCOPY;  Service: Endoscopy;  Laterality: N/A;  . ERCP N/A 05/06/2017   Procedure: ENDOSCOPIC RETROGRADE CHOLANGIOPANCREATOGRAPHY (ERCP);  Surgeon: Lucilla Lame, MD;  Location: [facility] ENDOSCOPY;  Service: Endoscopy;  Laterality: N/A;  . PANCREATECTOMY, PROXIMAL SUBTOTAL WITH TOTAL DUODENECTOMY, PARTIAL GASTRECTOMY, CHOLEDOCHOENTEROSTOMY AND GASTROJEJUNOSTOMY (WHIPPLE-TYPE PROCEDURE); WITHOUT PANCREATOJEJUNOSTOMY  09/06/2017  . PORTA CATH INSERTION N/A 04/18/2017   Procedure: PORTA CATH INSERTION;  Surgeon: Algernon Huxley, MD;  Location: [facility] CV LAB;  Service: Cardiovascular;  Laterality: N/A;    FAMILY HISTORY :   Family History  Problem Relation Age of Onset  . Pancreatic cancer Mother 60       deceased 9  . Diabetes Father   . Colon cancer Maternal Grandmother 70       deceased 41    SOCIAL HISTORY:   Social History   Tobacco Use  . Smoking status: Former Smoker    Packs/day: 0.25    Years: 5.00    Pack years: 1.25    Types: Cigarettes    Last attempt to quit: 05/07/2015    Years since quitting: 2.8  . Smokeless tobacco: Never Used  Substance Use Topics  . Alcohol use: No  . Drug use: No    ALLERGIES:  has No Known Allergies.  MEDICATIONS:  Current Outpatient Medications  Medication Sig Dispense Refill  . acetaminophen  (TYLENOL) 325 MG tablet Take 2 tablets (650 mg total) by mouth every 6 (six) hours as needed for mild pain or fever (headache). 20 tablet 0  . fentaNYL (DURAGESIC - DOSED MCG/HR) 75 MCG/HR Place 1 patch (75 mcg total) onto the skin every 3 (three) days. 10 patch 0  . Oxycodone HCl 10 MG TABS Take 1 tablet (10 mg total) by mouth every 6 (six) hours as needed for severe pain or breakthrough pain. 60 tablet 0  . pantoprazole (PROTONIX) 40 MG tablet Take 1 tablet by mouth daily.    Marland Kitchen albuterol (PROVENTIL HFA;VENTOLIN HFA) 108 (90 Base) MCG/ACT inhaler Inhale 1-2 puffs every 6 (six) hours as needed into the lungs for wheezing or shortness of breath.    . ondansetron (ZOFRAN) 8 MG tablet One pill every 8 hours as needed for nausea/vomitting. (Patient not taking: Reported on 02/21/2018) 30 tablet 0   No current facility-administered medications for this visit.    Facility-Administered Medications Ordered in Other Visits  Medication Dose Route Frequency Provider Last Rate Last Dose  . sodium chloride flush (NS) 0.9 % injection 10 mL  10 mL Intravenous PRN Cammie Sickle, MD   10 mL at 06/15/17 0941    PHYSICAL EXAMINATION: ECOG PERFORMANCE STATUS: 0 - Asymptomatic  BP (!) 127/92 (BP Location: Left Arm, Patient Position: Sitting)   Pulse 96 Comment: manuallly  Temp (!) 97.4 F (36.3 C) (Tympanic)   Resp 18   Wt 120  lb 9.6 oz (54.7 kg)   BMI 21.36 kg/m   Filed Weights   03/16/18 0904  Weight: 120 lb 9.6 oz (54.7 kg)    Physical Exam  Constitutional: He is oriented to person, place, and time.  Patient is accompanied by fianc . he is in mild distress because of abdominal pain.  HENT:  Head: Normocephalic and atraumatic.  Mouth/Throat: Oropharynx is clear and moist. No oropharyngeal exudate.  Eyes: Pupils are equal, round, and reactive to light.  Neck: Normal range of motion. Neck supple.  Cardiovascular: Normal rate and regular rhythm.  Pulmonary/Chest: No respiratory distress. He  has no wheezes.  Abdominal: Soft. Bowel sounds are normal. He exhibits distension. He exhibits no mass. There is tenderness. There is no rebound and no guarding.  No rigidity or guarding.  Abdominal tenderness generalized more so in the right lower quadrant.  Musculoskeletal: Normal range of motion. He exhibits no edema or tenderness.  Neurological: He is alert and oriented to person, place, and time.  Skin: Skin is warm.  Psychiatric: Affect normal.       LABORATORY DATA:  I have reviewed the data as listed    Component Value Date/Time   NA 134 (L) 03/16/2018 0947   K 4.3 03/16/2018 0947   CL 101 03/16/2018 0947   CO2 27 03/16/2018 0947   GLUCOSE 109 (H) 03/16/2018 0947   BUN 26 (H) 03/16/2018 0947   CREATININE 0.62 03/16/2018 0947   CALCIUM 9.1 03/16/2018 0947   PROT 7.6 03/16/2018 0947   ALBUMIN 3.4 (L) 03/16/2018 0947   AST 60 (H) 03/16/2018 0947   ALT 55 (H) 03/16/2018 0947   ALKPHOS 89 03/16/2018 0947   BILITOT 0.9 03/16/2018 0947   GFRNONAA >60 03/16/2018 0947   GFRAA >60 03/16/2018 0947    No results found for: SPEP, UPEP  Lab Results  Component Value Date   WBC 5.7 03/16/2018   NEUTROABS 4.3 03/16/2018   HGB 12.7 (L) 03/16/2018   HCT 37.0 (L) 03/16/2018   MCV 92.7 03/16/2018   PLT 255 03/16/2018      Chemistry      Component Value Date/Time   NA 134 (L) 03/16/2018 0947   K 4.3 03/16/2018 0947   CL 101 03/16/2018 0947   CO2 27 03/16/2018 0947   BUN 26 (H) 03/16/2018 0947   CREATININE 0.62 03/16/2018 0947      Component Value Date/Time   CALCIUM 9.1 03/16/2018 0947   ALKPHOS 89 03/16/2018 0947   AST 60 (H) 03/16/2018 0947   ALT 55 (H) 03/16/2018 0947   BILITOT 0.9 03/16/2018 0947       RADIOGRAPHIC STUDIES: I have personally reviewed the radiological images as listed and agreed with the findings in the report. No results found.   ASSESSMENT & PLAN:  Malignant tumor of body of pancreas ([place]) # Pancreatic adenocarcinoma stage -Stage IV.  Recommend starting gemcitabine Abraxane every 2 weeks.  #Proceed with cycle #1 today; Labs today reviewed;  acceptable for treatment today.   # I reviewed with the patient and fianc regarding-stage IV pancreatic cancer.  Unfortunately this is incurable; and treatments are palliative.  Discussed response rates in about 20%.  # Discussed regarding potential side effects including but not limited to nausea vomiting diarrhea neuropathy; risk of infections etc.  #Malignant ascites-monitor closely patient will likely need further paracentesis; positive for malignant cells in the fluid.  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  #Pain control-poorly controlled; increase the dose of fentanyl  patch to 75 mcg; also oxycodone every 6 hours.  #Constipation-second to narcotics.  Continue MiraLAX.  # molecular testing on pathology-pending.    # DISPOSITION; # today- cbc/cmp/gem-abraxane # [place]- 10/29- MD/ no labs # [place]- 11/05- labs/ MD- cbc/cmp/gem-abraxane  Addendum spoke to patient's fianc-regarding above prognosis/treatment plan.   Orders Placed This Encounter  Procedures  . CBC with Differential/Platelet    Standing Status:   Future    Number of Occurrences:   1    Standing Expiration Date:   03/17/2019  . Comprehensive metabolic panel    Standing Status:   Future    Number of Occurrences:   1    Standing Expiration Date:   03/17/2019   All questions were answered. The patient knows to call the clinic with any problems, questions or concerns.      Cammie Sickle, MD 03/16/2018 6:19 PM

## 2018-03-16 NOTE — Progress Notes (Signed)
Here for follow up. Per pt pain " not controlled " stated pain qd. But appetite better he stated.on 03/07/18 weight was 125 -now 120.6

## 2018-03-21 ENCOUNTER — Ambulatory Visit: Payer: Medicaid Other | Admitting: Internal Medicine

## 2018-03-21 ENCOUNTER — Encounter: Payer: Self-pay | Admitting: Internal Medicine

## 2018-03-21 ENCOUNTER — Inpatient Hospital Stay (HOSPITAL_BASED_OUTPATIENT_CLINIC_OR_DEPARTMENT_OTHER): Payer: Medicaid Other | Admitting: Internal Medicine

## 2018-03-21 ENCOUNTER — Other Ambulatory Visit: Payer: Medicaid Other

## 2018-03-21 VITALS — BP 118/78 | HR 92 | Temp 95.1°F | Resp 15 | Wt 118.0 lb

## 2018-03-21 DIAGNOSIS — R634 Abnormal weight loss: Secondary | ICD-10-CM | POA: Diagnosis not present

## 2018-03-21 DIAGNOSIS — I81 Portal vein thrombosis: Secondary | ICD-10-CM

## 2018-03-21 DIAGNOSIS — K5900 Constipation, unspecified: Secondary | ICD-10-CM

## 2018-03-21 DIAGNOSIS — Z5111 Encounter for antineoplastic chemotherapy: Secondary | ICD-10-CM | POA: Diagnosis not present

## 2018-03-21 DIAGNOSIS — Z79899 Other long term (current) drug therapy: Secondary | ICD-10-CM

## 2018-03-21 DIAGNOSIS — Z800 Family history of malignant neoplasm of digestive organs: Secondary | ICD-10-CM

## 2018-03-21 DIAGNOSIS — J8410 Pulmonary fibrosis, unspecified: Secondary | ICD-10-CM

## 2018-03-21 DIAGNOSIS — C251 Malignant neoplasm of body of pancreas: Secondary | ICD-10-CM

## 2018-03-21 DIAGNOSIS — Z87891 Personal history of nicotine dependence: Secondary | ICD-10-CM

## 2018-03-21 DIAGNOSIS — C7951 Secondary malignant neoplasm of bone: Secondary | ICD-10-CM

## 2018-03-21 DIAGNOSIS — J45909 Unspecified asthma, uncomplicated: Secondary | ICD-10-CM

## 2018-03-21 DIAGNOSIS — R180 Malignant ascites: Secondary | ICD-10-CM

## 2018-03-21 DIAGNOSIS — R188 Other ascites: Secondary | ICD-10-CM

## 2018-03-21 DIAGNOSIS — Z7901 Long term (current) use of anticoagulants: Secondary | ICD-10-CM

## 2018-03-21 NOTE — Progress Notes (Signed)
[name] OFFICE PROGRESS NOTE  Patient Care Team: Nathaniel Sarah, MD as PCP - General (Family Medicine) Nathaniel Jacks, 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; [name]; [name]; 4/16- whipples [Nathaniel Saunders; Nathaniel Saunders]- close margins; [name];   # Nathaniel Saunders [June 3rd to sep 3rd,2019]; progression [CT Oct 10th 2019- [facility]- ascites/1.6 lits; cytology pos-Adeno ca];    # October, 22nd 2019- Gem- Abraxane  # Stomach- H.Pylori [[facility]] S/p prevpack [may 2019];Oct 2019- Portal/Hepatic Vein Thrombosis [Eliquis] ---------------------------------------------------   # Nov 2018-biliary obstruction status post ERCP; stenting [Nathaniel Saunders]; DEC 14th- ERCP [Nathaniel Saunders] s/p metal stent   # Family history of pancreatic cancer-  83 genes on [facility]'s Multi-Cancer panel-NEGATIVE [Nathaniel Saunders; [name]; [name], [name], [name], and PALB2 c.1655A>G (p.Gln552Arg. This is still considered a normal result.  # Molecular testing- Not done -------------------------------------------------   DIAGNOSIS:  Pancreatic cancer  STAGE:  IV       ;GOALS: pallaitive  CURRENT/MOST RECENT THERAPY- Gem-Abraxane          Malignant tumor of body of pancreas ([place])   10/11/2017 - 01/24/2018 Chemotherapy    The patient had gemcitabine (GEMZAR) 1,600 mg in sodium chloride 0.9 % 250 mL chemo infusion, 1,672 mg, Intravenous,  Once, 4 of 6 cycles Dose modification: 900 mg/m2 (original dose 1,000 mg/m2, Cycle 3, Reason: Provider Judgment), 800 mg/m2 (original dose 1,000 mg/m2, Cycle 4, Reason:  Provider Judgment) Administration: 1,600 mg (10/24/2017), 1,600 mg (11/07/2017), 1,600 mg (11/21/2017), 1,600 mg (11/28/2017), 1,400 mg (12/26/2017), 1,400 mg (01/02/2018), 1,400 mg (01/09/2018)  for chemotherapy treatment.     03/13/2018 -  Chemotherapy    The patient had gemcitabine (GEMZAR) 1,600 mg in sodium chloride 0.9 % 250 mL chemo infusion, 1,596 mg, Intravenous,  Once, 1 of 4 cycles Administration: 1,600 mg (03/16/2018) ondansetron (ZOFRAN) 8 mg, dexamethasone (DECADRON) 10 mg in sodium chloride 0.9 % 50 mL IVPB, , Intravenous,  Once, 1 of 4 cycles PACLitaxel-protein bound (ABRAXANE) chemo infusion 200 mg, 125 mg/m2 = 200 mg, Intravenous, Once, 1 of 4 cycles Administration: 200 mg (03/16/2018)  for chemotherapy treatment.        INTERVAL HISTORY:  Nathaniel Saunders 47 y.o.  male with recurrent/stage IV pancreatic cancer with ascites status post cycle #1 of gemcitabine and Abraxane last week is here for follow-up.  Patient is currently on fentanyl patch 75 mcg; however taking oxycodone 10 mg once or twice a day.  He complains of pain in his right hip bone/lower back.  No bladder or bowel incontinence.  Complains of poor appetite.  Weight loss.  Fatigue.  Denies any tingling or numbness.  Abdominal distention not significantly worse.  Leg swelling.  Review of Systems  Constitutional: Positive for weight loss. Negative for chills, diaphoresis, fever and malaise/fatigue.  HENT: Negative for nosebleeds and sore throat.   Eyes: Negative for double vision.  Respiratory: Negative for cough, hemoptysis, sputum production, shortness of breath and wheezing.   Cardiovascular: Positive for leg swelling. Negative for chest pain, palpitations and orthopnea.  Gastrointestinal: Positive for abdominal pain, nausea and vomiting. Negative for blood in stool, constipation, heartburn and melena.  Genitourinary: Negative for dysuria, frequency  and urgency.  Musculoskeletal: Positive for back pain. Negative  for joint pain.  Skin: Negative.  Negative for itching and rash.  Neurological: Negative for dizziness, tingling, focal weakness, weakness and headaches.  Endo/Heme/Allergies: Does not bruise/bleed easily.  Psychiatric/Behavioral: Negative for depression. The patient is not nervous/anxious and does not have insomnia.       PAST MEDICAL HISTORY :  Past Medical History:  Diagnosis Date  . Asthma   . Genetic testing 05/20/2017   Multi-Cancer panel (83 genes) @ [facility] - No pathogenic mutations detected  . Pancreatic cancer ([place])   . Pancreatic mass     PAST SURGICAL HISTORY :   Past Surgical History:  Procedure Laterality Date  . ERCP N/A 04/21/2017   Procedure: ENDOSCOPIC RETROGRADE CHOLANGIOPANCREATOGRAPHY (ERCP);  Surgeon: Nathaniel Lame, MD;  Location: [facility] ENDOSCOPY;  Service: Endoscopy;  Laterality: N/A;  . ERCP N/A 05/06/2017   Procedure: ENDOSCOPIC RETROGRADE CHOLANGIOPANCREATOGRAPHY (ERCP);  Surgeon: Nathaniel Lame, MD;  Location: [facility] ENDOSCOPY;  Service: Endoscopy;  Laterality: N/A;  . PANCREATECTOMY, PROXIMAL SUBTOTAL WITH TOTAL DUODENECTOMY, PARTIAL GASTRECTOMY, CHOLEDOCHOENTEROSTOMY AND GASTROJEJUNOSTOMY (WHIPPLE-TYPE PROCEDURE); WITHOUT PANCREATOJEJUNOSTOMY  09/06/2017  . PORTA CATH INSERTION N/A 04/18/2017   Procedure: PORTA CATH INSERTION;  Surgeon: Algernon Huxley, MD;  Location: [place] CV LAB;  Service: Cardiovascular;  Laterality: N/A;    FAMILY HISTORY :   Family History  Problem Relation Age of Onset  . Pancreatic cancer Mother 60       deceased 88  . Diabetes Father   . Colon cancer Maternal Grandmother 70       deceased 52    SOCIAL HISTORY:   Social History   Tobacco Use  . Smoking status: Former Smoker    Packs/day: 0.25    Years: 5.00    Pack years: 1.25    Types: Cigarettes    Last attempt to quit: 05/07/2015    Years since quitting: 2.8  . Smokeless tobacco: Never Used  Substance Use Topics  . Alcohol use: No  . Drug use: No     ALLERGIES:  has No Known Allergies.  MEDICATIONS:  Current Outpatient Medications  Medication Sig Dispense Refill  . acetaminophen (TYLENOL) 325 MG tablet Take 2 tablets (650 mg total) by mouth every 6 (six) hours as needed for mild pain or fever (headache). 20 tablet 0  . albuterol (PROVENTIL HFA;VENTOLIN HFA) 108 (90 Base) MCG/ACT inhaler Inhale 1-2 puffs every 6 (six) hours as needed into the lungs for wheezing or shortness of breath.    . fentaNYL (DURAGESIC - DOSED MCG/HR) 75 MCG/HR Place 1 patch (75 mcg total) onto the skin every 3 (three) days. 10 patch 0  . Oxycodone HCl 10 MG TABS Take 1 tablet (10 mg total) by mouth every 6 (six) hours as needed for severe pain or breakthrough pain. 60 tablet 0  . pantoprazole (PROTONIX) 40 MG tablet Take 1 tablet by mouth daily.    . ondansetron (ZOFRAN) 8 MG tablet One pill every 8 hours as needed for nausea/vomitting. (Patient not taking: Reported on 03/21/2018) 30 tablet 0   No current facility-administered medications for this visit.    Facility-Administered Medications Ordered in Other Visits  Medication Dose Route Frequency Provider Last Rate Last Dose  . sodium chloride flush (NS) 0.9 % injection 10 mL  10 mL Intravenous PRN Cammie Sickle, MD   10 mL at 06/15/17 0941    PHYSICAL EXAMINATION: ECOG PERFORMANCE STATUS: 0 - Asymptomatic  BP 118/78 (BP Location: Left Arm, Patient  Position: Sitting)   Pulse 92   Temp (!) 95.1 F (35.1 C) (Tympanic)   Resp 15   Wt 118 lb (53.5 kg)   BMI 20.90 kg/m   Filed Weights   03/21/18 0945  Weight: 118 lb (53.5 kg)    Physical Exam  Constitutional: He is oriented to person, place, and time.  Patient is accompanied by fianc . he is in mild distress because of abdominal pain.  Cachectic appearing.  Positive for temporal wasting  HENT:  Head: Normocephalic and atraumatic.  Mouth/Throat: Oropharynx is clear and moist. No oropharyngeal exudate.  Eyes: Pupils are equal, round, and  reactive to light.  Neck: Normal range of motion. Neck supple.  Cardiovascular: Normal rate and regular rhythm.  Pulmonary/Chest: No respiratory distress. He has no wheezes.  Abdominal: Soft. Bowel sounds are normal. He exhibits distension. He exhibits no mass. There is tenderness. There is no rebound and no guarding.  No rigidity or guarding.  Abdominal tenderness generalized more so in the right lower quadrant.  Musculoskeletal: Normal range of motion. He exhibits no edema or tenderness.  Neurological: He is alert and oriented to person, place, and time.  Skin: Skin is warm.  Psychiatric: Affect normal.   LABORATORY DATA:  I have reviewed the data as listed    Component Value Date/Time   NA 134 (L) 03/16/2018 0947   K 4.3 03/16/2018 0947   CL 101 03/16/2018 0947   CO2 27 03/16/2018 0947   GLUCOSE 109 (H) 03/16/2018 0947   BUN 26 (H) 03/16/2018 0947   CREATININE 0.62 03/16/2018 0947   CALCIUM 9.1 03/16/2018 0947   PROT 7.6 03/16/2018 0947   ALBUMIN 3.4 (L) 03/16/2018 0947   AST 60 (H) 03/16/2018 0947   ALT 55 (H) 03/16/2018 0947   ALKPHOS 89 03/16/2018 0947   BILITOT 0.9 03/16/2018 0947   GFRNONAA >60 03/16/2018 0947   GFRAA >60 03/16/2018 0947    No results found for: SPEP, UPEP  Lab Results  Component Value Date   WBC 5.7 03/16/2018   NEUTROABS 4.3 03/16/2018   HGB 12.7 (L) 03/16/2018   HCT 37.0 (L) 03/16/2018   MCV 92.7 03/16/2018   PLT 255 03/16/2018      Chemistry      Component Value Date/Time   NA 134 (L) 03/16/2018 0947   K 4.3 03/16/2018 0947   CL 101 03/16/2018 0947   CO2 27 03/16/2018 0947   BUN 26 (H) 03/16/2018 0947   CREATININE 0.62 03/16/2018 0947      Component Value Date/Time   CALCIUM 9.1 03/16/2018 0947   ALKPHOS 89 03/16/2018 0947   AST 60 (H) 03/16/2018 0947   ALT 55 (H) 03/16/2018 0947   BILITOT 0.9 03/16/2018 0947       RADIOGRAPHIC STUDIES: I have personally reviewed the radiological images as listed and agreed with the  findings in the report. No results found.   ASSESSMENT & PLAN:  Malignant tumor of body of pancreas ([place]) # Pancreatic adenocarcinoma stage -Stage IV. Currently on gemcitabine Abraxane every 2 weeks. s/p cycle #1 appx. 5 days ago.  # proceed with cycle #2 as planned next week.   # cough/runny nose; no fevers- ? Bronchitis- symptomatic control.   #Malignant ascites-monitor closely patient will likely need further paracentesis; positive for malignant cells in the fluid.  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  # back pain control-poorly controlled; increase the dose of fentanyl patch to 75 mcg; recommend oxycodone every 6-8 hours.  #  Constipation-second to narcotics.  Continue MiraLAX.  # molecular testing on pathology-pending.   # Weight loss-   # DISPOSITION; # keep appts as planned # Bone scan asap-Dr.B   Orders Placed This Encounter  Procedures  . NM Bone Scan Whole Body    Standing Status:   Future    Standing Expiration Date:   03/21/2019    Order Specific Question:   ** REASON FOR EXAM (FREE TEXT)    Answer:   pancreatic cancer; rigt hip pain    Order Specific Question:   If indicated for the ordered procedure, I authorize the administration of a radiopharmaceutical per Radiology protocol    Answer:   Yes    Order Specific Question:   Preferred imaging location?    Answer:   [facility]    Order Specific Question:   Radiology Contrast Protocol - do NOT remove file path    Answer:   [email or web address]   All questions were answered. The patient knows to call the clinic with any problems, questions or concerns.      Cammie Sickle, MD 03/21/2018 11:29 AM

## 2018-03-21 NOTE — Assessment & Plan Note (Addendum)
#   Pancreatic adenocarcinoma stage -Stage IV. Currently on gemcitabine Abraxane every 2 weeks. s/p cycle #1 appx. 5 days ago.  # proceed with cycle #2 as planned next week.   # cough/runny nose; no fevers- ? Bronchitis- symptomatic control.   #Malignant ascites-monitor closely patient will likely need further paracentesis; positive for malignant cells in the fluid.  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  # back pain control-poorly controlled; increase the dose of fentanyl patch to 75 mcg; recommend oxycodone every 6-8 hours.  #Constipation-second to narcotics.  Continue MiraLAX.  # molecular testing on pathology-pending.   # Weight loss-   # DISPOSITION; # keep appts as planned # Bone scan asap-Dr.B

## 2018-03-23 ENCOUNTER — Encounter
Admission: RE | Admit: 2018-03-23 | Discharge: 2018-03-23 | Disposition: A | Discharge status: Home / Self Care | Payer: Medicaid Other | Source: Ambulatory Visit | Attending: Internal Medicine | Admitting: Internal Medicine

## 2018-03-23 ENCOUNTER — Ambulatory Visit
Admission: RE | Admit: 2018-03-23 | Discharge: 2018-03-23 | Disposition: A | Discharge status: Home / Self Care | Payer: Medicaid Other | Source: Ambulatory Visit | Attending: Internal Medicine | Admitting: Internal Medicine

## 2018-03-23 DIAGNOSIS — C251 Malignant neoplasm of body of pancreas: Secondary | ICD-10-CM | POA: Insufficient documentation

## 2018-03-23 DIAGNOSIS — C7951 Secondary malignant neoplasm of bone: Secondary | ICD-10-CM | POA: Insufficient documentation

## 2018-03-23 MED ORDER — TECHNETIUM TC 99M MEDRONATE IV KIT
24.0720 | PACK | Freq: Once | INTRAVENOUS | Status: AC | PRN
  Administered 2018-03-23: 24.072 via INTRAVENOUS

## 2018-03-28 ENCOUNTER — Ambulatory Visit: Payer: Medicaid Other

## 2018-03-28 ENCOUNTER — Inpatient Hospital Stay: Payer: Medicaid Other

## 2018-03-28 ENCOUNTER — Inpatient Hospital Stay: Payer: Medicaid Other | Admitting: Internal Medicine

## 2018-03-28 ENCOUNTER — Telehealth: Payer: Self-pay | Admitting: Internal Medicine

## 2018-03-28 ENCOUNTER — Encounter: Payer: Self-pay | Admitting: Emergency Medicine

## 2018-03-28 ENCOUNTER — Inpatient Hospital Stay
Admission: EM | Admit: 2018-03-28 | Discharge: 2018-03-29 | DRG: 441 | Disposition: A | Discharge status: Hospice | Payer: Medicaid Other | Attending: Internal Medicine | Admitting: Internal Medicine

## 2018-03-28 ENCOUNTER — Other Ambulatory Visit: Payer: Self-pay

## 2018-03-28 ENCOUNTER — Ambulatory Visit: Payer: Medicaid Other | Admitting: Internal Medicine

## 2018-03-28 DIAGNOSIS — R630 Anorexia: Secondary | ICD-10-CM | POA: Diagnosis not present

## 2018-03-28 DIAGNOSIS — K7200 Acute and subacute hepatic failure without coma: Secondary | ICD-10-CM | POA: Diagnosis not present

## 2018-03-28 DIAGNOSIS — L8990 Pressure ulcer of unspecified site, unspecified stage: Secondary | ICD-10-CM

## 2018-03-28 DIAGNOSIS — R1084 Generalized abdominal pain: Secondary | ICD-10-CM | POA: Diagnosis not present

## 2018-03-28 DIAGNOSIS — C799 Secondary malignant neoplasm of unspecified site: Secondary | ICD-10-CM | POA: Diagnosis present

## 2018-03-28 DIAGNOSIS — R109 Unspecified abdominal pain: Secondary | ICD-10-CM

## 2018-03-28 DIAGNOSIS — C259 Malignant neoplasm of pancreas, unspecified: Secondary | ICD-10-CM | POA: Diagnosis not present

## 2018-03-28 DIAGNOSIS — G893 Neoplasm related pain (acute) (chronic): Secondary | ICD-10-CM | POA: Diagnosis not present

## 2018-03-28 DIAGNOSIS — R180 Malignant ascites: Secondary | ICD-10-CM | POA: Diagnosis not present

## 2018-03-28 DIAGNOSIS — C251 Malignant neoplasm of body of pancreas: Secondary | ICD-10-CM | POA: Diagnosis present

## 2018-03-28 DIAGNOSIS — R7989 Other specified abnormal findings of blood chemistry: Secondary | ICD-10-CM | POA: Diagnosis present

## 2018-03-28 DIAGNOSIS — Z515 Encounter for palliative care: Secondary | ICD-10-CM

## 2018-03-28 DIAGNOSIS — Z66 Do not resuscitate: Secondary | ICD-10-CM | POA: Diagnosis not present

## 2018-03-28 DIAGNOSIS — I81 Portal vein thrombosis: Secondary | ICD-10-CM | POA: Diagnosis present

## 2018-03-28 DIAGNOSIS — K5900 Constipation, unspecified: Secondary | ICD-10-CM | POA: Diagnosis not present

## 2018-03-28 DIAGNOSIS — Z9221 Personal history of antineoplastic chemotherapy: Secondary | ICD-10-CM

## 2018-03-28 DIAGNOSIS — Z79891 Long term (current) use of opiate analgesic: Secondary | ICD-10-CM | POA: Diagnosis not present

## 2018-03-28 DIAGNOSIS — J45909 Unspecified asthma, uncomplicated: Secondary | ICD-10-CM | POA: Diagnosis present

## 2018-03-28 DIAGNOSIS — Z79899 Other long term (current) drug therapy: Secondary | ICD-10-CM

## 2018-03-28 DIAGNOSIS — Z90411 Acquired partial absence of pancreas: Secondary | ICD-10-CM | POA: Diagnosis not present

## 2018-03-28 DIAGNOSIS — R140 Abdominal distension (gaseous): Secondary | ICD-10-CM | POA: Diagnosis not present

## 2018-03-28 DIAGNOSIS — R188 Other ascites: Secondary | ICD-10-CM

## 2018-03-28 DIAGNOSIS — Z87891 Personal history of nicotine dependence: Secondary | ICD-10-CM

## 2018-03-28 DIAGNOSIS — M7989 Other specified soft tissue disorders: Secondary | ICD-10-CM | POA: Diagnosis not present

## 2018-03-28 DIAGNOSIS — R634 Abnormal weight loss: Secondary | ICD-10-CM | POA: Diagnosis not present

## 2018-03-28 HISTORY — DX: Portal vein thrombosis: I81

## 2018-03-28 LAB — COMPREHENSIVE METABOLIC PANEL
ALT: 790 U/L — ABNORMAL HIGH (ref 0–44)
AST: 1636 U/L — AB (ref 15–41)
Albumin: 2.8 g/dL — ABNORMAL LOW (ref 3.5–5.0)
Alkaline Phosphatase: 814 U/L — ABNORMAL HIGH (ref 38–126)
Anion gap: 7 (ref 5–15)
BUN: 68 mg/dL — ABNORMAL HIGH (ref 6–20)
CHLORIDE: 99 mmol/L (ref 98–111)
CO2: 26 mmol/L (ref 22–32)
Calcium: 8.2 mg/dL — ABNORMAL LOW (ref 8.9–10.3)
Creatinine, Ser: 0.48 mg/dL — ABNORMAL LOW (ref 0.61–1.24)
GFR calc non Af Amer: >60 mL/min (ref >60)
Glucose, Bld: 54 mg/dL — ABNORMAL LOW (ref 70–99)
POTASSIUM: 4.7 mmol/L (ref 3.5–5.1)
SODIUM: 132 mmol/L — AB (ref 135–145)
Total Bilirubin: 3.2 mg/dL — ABNORMAL HIGH (ref 0.3–1.2)
Total Protein: 7 g/dL (ref 6.5–8.1)

## 2018-03-28 LAB — BODY FLUID CELL COUNT WITH DIFFERENTIAL
Eos, Fluid: 0 %
Lymphs, Fluid: 71 %
Monocyte-Macrophage-Serous Fluid: 7 %
Neutrophil Count, Fluid: 22 %
OTHER CELLS FL: 0 %
Total Nucleated Cell Count, Fluid: 187 cu mm

## 2018-03-28 LAB — GLUCOSE, CAPILLARY
GLUCOSE-CAPILLARY: 109 mg/dL — AB (ref 70–99)
Glucose-Capillary: 19 mg/dL — CL (ref 70–99)

## 2018-03-28 LAB — CBC
HCT: 34.5 % — ABNORMAL LOW (ref 39.0–52.0)
HEMOGLOBIN: 12 g/dL — AB (ref 13.0–17.0)
MCH: 31.8 pg (ref 26.0–34.0)
MCHC: 34.8 g/dL (ref 30.0–36.0)
MCV: 91.5 fL (ref 80.0–100.0)
PLATELETS: 95 10*3/uL — AB (ref 150–400)
RBC: 3.77 MIL/uL — AB (ref 4.22–5.81)
RDW: 14.8 % (ref 11.5–15.5)
WBC: 5.7 10*3/uL (ref 4.0–10.5)
nRBC: 0 % (ref 0.0–0.2)

## 2018-03-28 LAB — PROTIME-INR
INR: 1.6
PROTHROMBIN TIME: 18.9 s — AB (ref 11.4–15.2)

## 2018-03-28 LAB — GLUCOSE, RANDOM: Glucose, Bld: 123 mg/dL — ABNORMAL HIGH (ref 70–99)

## 2018-03-28 LAB — LACTATE DEHYDROGENASE, PLEURAL OR PERITONEAL FLUID: LD FL: 95 U/L — AB (ref 3–23)

## 2018-03-28 LAB — PROTEIN, PLEURAL OR PERITONEAL FLUID: Total protein, fluid: <3 g/dL

## 2018-03-28 LAB — LIPASE, BLOOD: Lipase: 18 U/L (ref 11–51)

## 2018-03-28 MED ORDER — OXYCODONE HCL 5 MG PO TABS
10.0000 mg | ORAL_TABLET | ORAL | Status: DC | PRN
  Administered 2018-03-29: 01:00:00 10 mg via ORAL
  Filled 2018-03-28 (×2): qty 2

## 2018-03-28 MED ORDER — HYDROMORPHONE HCL 1 MG/ML IJ SOLN
0.5000 mg | INTRAMUSCULAR | Status: DC | PRN
  Administered 2018-03-29: 09:00:00 0.5 mg via INTRAVENOUS
  Filled 2018-03-28 (×2): qty 1

## 2018-03-28 MED ORDER — FENTANYL 75 MCG/HR TD PT72
75.0000 ug | MEDICATED_PATCH | TRANSDERMAL | Status: DC
  Administered 2018-03-29: 09:00:00 75 ug via TRANSDERMAL
  Filled 2018-03-28: qty 1

## 2018-03-28 MED ORDER — DEXTROSE 50 % IV SOLN
INTRAVENOUS | Status: AC
  Administered 2018-03-28: 25 mL via INTRAVENOUS
  Filled 2018-03-28: qty 50

## 2018-03-28 MED ORDER — SODIUM CHLORIDE 0.9 % IV SOLN
2.0000 g | INTRAVENOUS | Status: DC
  Administered 2018-03-28: 2 g via INTRAVENOUS
  Filled 2018-03-28: qty 2
  Filled 2018-03-28: qty 20

## 2018-03-28 MED ORDER — ACETAMINOPHEN 325 MG PO TABS: 650.0000 mg | ORAL_TABLET | Freq: Four times a day (QID) | ORAL | Status: DC | PRN

## 2018-03-28 MED ORDER — HYDROMORPHONE HCL 1 MG/ML IJ SOLN
1.0000 mg | Freq: Once | INTRAMUSCULAR | Status: AC
  Administered 2018-03-28: 1 mg via INTRAVENOUS

## 2018-03-28 MED ORDER — SODIUM CHLORIDE 0.9 % IV SOLN
1000.0000 mL | Freq: Once | INTRAVENOUS | Status: AC
  Administered 2018-03-28: 1000 mL via INTRAVENOUS

## 2018-03-28 MED ORDER — FENTANYL 75 MCG/HR TD PT72: 75.0000 ug | MEDICATED_PATCH | TRANSDERMAL | Status: DC

## 2018-03-28 MED ORDER — ACETAMINOPHEN 650 MG RE SUPP: 650.0000 mg | Freq: Four times a day (QID) | RECTAL | Status: DC | PRN

## 2018-03-28 MED ORDER — HYDROMORPHONE HCL 1 MG/ML IJ SOLN
INTRAMUSCULAR | Status: AC
  Administered 2018-03-28: 1 mg via INTRAVENOUS
  Filled 2018-03-28: qty 1

## 2018-03-28 MED ORDER — HYDROMORPHONE HCL 1 MG/ML IJ SOLN
0.5000 mg | INTRAMUSCULAR | Status: DC | PRN
  Administered 2018-03-28: 18:00:00 0.5 mg via INTRAVENOUS
  Filled 2018-03-28: qty 1

## 2018-03-28 MED ORDER — DEXTROSE 50 % IV SOLN
25.0000 mL | Freq: Once | INTRAVENOUS | Status: AC
  Administered 2018-03-28: 25 mL via INTRAVENOUS

## 2018-03-28 NOTE — Discharge Instructions (Signed)
Paracentesis, Care After °Refer to this sheet in the next few weeks. These instructions provide you with information about caring for yourself after your procedure. Your health care provider may also give you more specific instructions. Your treatment has been planned according to current medical practices, but problems sometimes occur. Call your health care provider if you have any problems or questions after your procedure. °What can I expect after the procedure? °After your procedure, it is common to have a small amount of clear fluid coming from the puncture site. °Follow these instructions at home: °· Return to your normal activities as told by your health care provider. Ask your health care provider what activities are safe for you. °· Take over-the-counter and prescription medicines only as told by your health care provider. °· Do not take baths, swim, or use a hot tub until your health care provider approves. °· Follow instructions from your health care provider about: °? How to take care of your puncture site. °? When and how you should change your bandage (dressing). °? When you should remove your dressing. °· Check your puncture area every day signs of infection. Watch for: °? Redness, swelling, or pain. °? Fluid, blood, or pus. °· Keep all follow-up visits as told by your health care provider. This is important. °Contact a health care provider if: °· You have redness, swelling, or pain at your puncture site. °· You start to have more clear fluid coming from your puncture site. °· You have blood or pus coming from your puncture site. °· You have chills. °· You have a fever. °Get help right away if: °· You develop chest pain or shortness of breath. °· You develop increasing pain, discomfort, or swelling in your abdomen. °· You feel dizzy or light-headed or you pass out. °This information is not intended to replace advice given to you by your health care provider. Make sure you discuss any questions you  have with your health care provider. °Document Released: 09/24/2014 Document Revised: 10/16/2015 Document Reviewed: 07/23/2014 °Elsevier Interactive Patient Education © 2018 [employer] Inc. ° °

## 2018-03-28 NOTE — Progress Notes (Signed)
Patient ID: Nathaniel Saunders, male   DOB: 09/09/1970, 47 y.o.   MRN: [id number]  ACP note  Patient, girlfriend and other family members at the bedside  Diagnosis: Metastatic pancreatic cancer, Ascites and abdominal pain, elevated liver function test, history of portal vein thrombosis.  CODE STATUS discussed.  Patient wishes to be a full code at this time.  I mentioned that if patient is not a candidate for further chemotherapy with Dr. Rogue Bussing that his overall prognosis is poor and he would be a candidate for hospice.  Patient still wants to be a full code at this time.  Plan.  Metastatic pancreatic cancer.  Pain control.  Palliative care consultations.  Ascites and abdominal pain get an ultrasound paracentesis and empiric Rocephin.  Elevated liver function test and bilirubin will get GI consultation to see if an ERCP is warranted.  Time spent on ACP discussion 17 minutes Dr. Loletha Grayer

## 2018-03-28 NOTE — Assessment & Plan Note (Deleted)
#   Pancreatic adenocarcinoma stage -Stage IV. Currently on gemcitabine Abraxane every 2 weeks. s/p cycle #1 appx. 5 days ago.  # proceed with cycle #2 as planned next week.   # cough/runny nose; no fevers- ? Bronchitis- symptomatic control.   #Malignant ascites-monitor closely patient will likely need further paracentesis; positive for malignant cells in the fluid.  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  # back pain control-poorly controlled; increase the dose of fentanyl patch to 75 mcg; recommend oxycodone every 6-8 hours.  #Constipation-second to narcotics.  Continue MiraLAX.  # molecular testing on pathology-pending.   # Weight loss-   # DISPOSITION; # keep appts as planned # Bone scan asap-Dr.B

## 2018-03-28 NOTE — Telephone Encounter (Signed)
Patient with metastatic pancreatic cancer;-unable to step out of the car because of worsening pain/weakness.  Discussed with family/fianc Taschia-recommend going to the emergency room for further evaluation/pain control/question paracentesis /possible admission to the hospital.   Long discussion regarding overall poor prognosis/would recommend hospice-given declining performance status/poor response to chemotherapy.

## 2018-03-28 NOTE — Consult Note (Signed)
[place]  Telephone:([phone] [phone]   Name: Nathaniel Saunders Date: 03/28/2018 MRN: [id number]  DOB: 1970-11-28  Patient Care Team: Maeola Sarah, [name], RN as Registered Nurse    REASON FOR CONSULTATION: Palliative Care consult requested for this 47 y.o. male with multiple medical problems including age for pancreatic cancer (diagnosed November 2018) status post Whipple's and neoadjuvant FOLFIRINOX currently on gemcitabine and Abraxane, malignant ascites requiring repeated paracentesis, portal vein thrombosis on Eliquis, chronic back pain on fentanyl and oxycodone.  Patient has a significant weight loss from 230 pounds a year ago to current weight of 118 pounds.  He apparently has lost another 10 pounds over the past week.  Patient was admitted to the hospital 03/28/2018 with progressive weakness and pain.  He is status post paracentesis with 2.8 L withdrawn.  Palliative care was consulted to help establish goals of care.   SOCIAL HISTORY:    Patient is not married.  He lives at home with his fiance and 92-year-old daughter.  He has an adult son and daughter.  ADVANCE DIRECTIVES:  Does not have  CODE STATUS: Full code  PAST MEDICAL HISTORY: Past Medical History:  Diagnosis Date  . Asthma   . Genetic testing 05/20/2017   Multi-Cancer panel (83 genes) @ [facility] - No pathogenic mutations detected  . Pancreatic cancer ([place])   . Pancreatic mass   . Portal vein thrombosis     PAST SURGICAL HISTORY:  Past Surgical History:  Procedure Laterality Date  . ERCP N/A 04/21/2017   Procedure: ENDOSCOPIC RETROGRADE CHOLANGIOPANCREATOGRAPHY (ERCP);  Surgeon: Lucilla Lame, MD;  Location: [facility] ENDOSCOPY;  Service: Endoscopy;  Laterality: N/A;  . ERCP N/A 05/06/2017   Procedure: ENDOSCOPIC RETROGRADE CHOLANGIOPANCREATOGRAPHY (ERCP);  Surgeon: Lucilla Lame, MD;  Location: [facility]  ENDOSCOPY;  Service: Endoscopy;  Laterality: N/A;  . PANCREATECTOMY, PROXIMAL SUBTOTAL WITH TOTAL DUODENECTOMY, PARTIAL GASTRECTOMY, CHOLEDOCHOENTEROSTOMY AND GASTROJEJUNOSTOMY (WHIPPLE-TYPE PROCEDURE); WITHOUT PANCREATOJEJUNOSTOMY  09/06/2017  . PORTA CATH INSERTION N/A 04/18/2017   Procedure: PORTA CATH INSERTION;  Surgeon: Algernon Huxley, MD;  Location: [facility];  Service: Cardiovascular;  Laterality: N/A;    HEMATOLOGY/ONCOLOGY HISTORY:  Oncology History   # November 2018- PANCREATIC ADENOCARCINOMA pancreatic neck mass [~2.5cm];  [name]; [name]; [name]; [name]; [facility]]- close margins; [name];   # [name] [June 3rd to sep 3rd,2019]; progression [CT Oct 10th 2019- [facility]- ascites/1.6 lits; cytology pos-Adeno ca];    # October, 22nd 2019- Gem- Abraxane  # Stomach- H.Pylori [[facility]] S/p prevpack [may 2019];Oct 2019- Portal/Hepatic Vein Thrombosis [[facility]] ---------------------------------------------------   # Nov 2018-biliary obstruction status post ERCP; [name]; [name]; [name]; [name], [name], [name], and PALB2 c.1655A>G (p.Gln552Arg. This is still considered a normal result.  # Molecular testing- Not done -------------------------------------------------   DIAGNOSIS:  Pancreatic cancer  STAGE:  IV       ;GOALS: pallaitive  CURRENT/MOST RECENT THERAPY- Gem-Abraxane          Malignant tumor of body of pancreas ([place])   10/11/2017 - 01/24/2018 Chemotherapy    The patient had gemcitabine (GEMZAR) 1,600  mg in sodium chloride 0.9 % 250 mL  chemo infusion, 1,672 mg, Intravenous,  Once, 4 of 6 cycles Dose modification: 900 mg/m2 (original dose 1,000 mg/m2, Cycle 3, Reason: Provider Judgment), 800 mg/m2 (original dose 1,000 mg/m2, Cycle 4, Reason: Provider Judgment) Administration: 1,600 mg (10/24/2017), 1,600 mg (11/07/2017), 1,600 mg (11/21/2017), 1,600 mg (11/28/2017), 1,400 mg (12/26/2017), 1,400 mg (01/02/2018), 1,400 mg (01/09/2018)  for chemotherapy treatment.     03/13/2018 -  Chemotherapy    The patient had gemcitabine (GEMZAR) 1,600 mg in sodium chloride 0.9 % 250 mL chemo infusion, 1,596 mg, Intravenous,  Once, 1 of 4 cycles Administration: 1,600 mg (03/16/2018) ondansetron (ZOFRAN) 8 mg, dexamethasone (DECADRON) 10 mg in sodium chloride 0.9 % 50 mL IVPB, , Intravenous,  Once, 1 of 4 cycles PACLitaxel-protein bound (ABRAXANE) chemo infusion 200 mg, 125 mg/m2 = 200 mg, Intravenous, Once, 1 of 4 cycles Administration: 200 mg (03/16/2018)  for chemotherapy treatment.      ALLERGIES:  has No Known Allergies.  MEDICATIONS:  Current Facility-Administered Medications  Medication Dose Route Frequency Provider Last Rate Last Dose  . acetaminophen (TYLENOL) tablet 650 mg  650 mg Oral Q6H PRN Loletha Grayer, MD       Or  . acetaminophen (TYLENOL) suppository 650 mg  650 mg Rectal Q6H PRN Wieting, Richard, MD      . cefTRIAXone (ROCEPHIN) 2 g in sodium chloride 0.9 % 100 mL IVPB  2 g Intravenous Q24H Wieting, Richard, MD      . fentaNYL ([place] - dosed mcg/hr) 75 mcg  75 mcg Transdermal Q72H Wieting, Richard, MD      . HYDROmorphone (DILAUDID) injection 0.5 mg  0.5 mg Intravenous Q4H PRN Wieting, Richard, MD      . oxyCODONE (Oxy IR/ROXICODONE) immediate release tablet 10 mg  10 mg Oral Q4H PRN Loletha Grayer, MD       Facility-Administered Medications Ordered in Other Encounters  Medication Dose Route Frequency Provider Last Rate Last Dose  . sodium chloride flush (NS) 0.9 % injection 10 mL  10 mL Intravenous PRN Cammie Sickle, MD   10 mL at 06/15/17 0941    VITAL SIGNS: BP (!) 110/92   Pulse 73   Resp (!) 23   Ht 5' 3"  (1.6 m)   Wt 118 lb (53.5 kg)   SpO2 97%   BMI 20.90 kg/m  Filed Weights   03/28/18 1125  Weight: 118 lb (53.5 kg)    Estimated body mass index is 20.9 kg/m as calculated from the following:   Height as of this encounter: 5' 3"  (1.6 m).   Weight as of this encounter: 118 lb (53.5 kg).  LABS: CBC:    Component Value Date/Time   WBC 5.7 03/28/2018 1223   HGB 12.0 (L) 03/28/2018 1223   HCT 34.5 (L) 03/28/2018 1223   PLT 95 (L) 03/28/2018 1223   MCV 91.5 03/28/2018 1223   NEUTROABS 4.3 03/16/2018 0947   LYMPHSABS 0.7 03/16/2018 0947   MONOABS 0.6 03/16/2018 0947   EOSABS 0.0 03/16/2018 0947   BASOSABS 0.0 03/16/2018 0947   Comprehensive Metabolic Panel:    Component Value Date/Time   NA 132 (L) 03/28/2018 1223   K 4.7 03/28/2018 1223   CL 99 03/28/2018 1223   CO2 26 03/28/2018 1223   BUN 68 (H) 03/28/2018 1223   CREATININE 0.48 (L) 03/28/2018 1223   GLUCOSE 54 (L) 03/28/2018 1223   CALCIUM 8.2 (L) 03/28/2018 1223   AST 1,636 (H) 03/28/2018 1223  ALT 790 (H) 03/28/2018 1223   ALKPHOS 814 (H) 03/28/2018 1223   BILITOT 3.2 (H) 03/28/2018 1223   PROT 7.0 03/28/2018 1223   ALBUMIN 2.8 (L) 03/28/2018 1223    RADIOGRAPHIC STUDIES: Nm Bone Scan Whole Body  Result Date: 03/24/2018 CLINICAL DATA:  Pancreatic cancer metastatic to bone, RIGHT hip pain EXAM: NUCLEAR MEDICINE WHOLE BODY BONE SCAN TECHNIQUE: Whole body anterior and posterior images were obtained approximately 3 hours after intravenous injection of radiopharmaceutical. RADIOPHARMACEUTICALS:  24.072 mCi Technetium-73mMDP IV COMPARISON:  None Correlation: CT abdomen and pelvis 02/17/2018 FINDINGS: Tracer extravasation at injection site at LEFT antecubital fossa. Symmetric normal tracer distribution throughout the axial and appendicular skeleton. No focal areas of abnormal increased tracer localization seen to  suggest osseous metastatic disease. Expected urinary tract and soft tissue distribution of tracer. IMPRESSION: No scintigraphic evidence of osseous metastatic disease. Electronically Signed   By: MLavonia DanaM.D.   On: 03/24/2018 07:40   [place]  Result Date: 03/28/2018 INDICATION: Metastatic pancreatic carcinoma with malignant ascites. EXAM: ULTRASOUND GUIDED PARACENTESIS MEDICATIONS: None. COMPLICATIONS: None immediate. PROCEDURE: Informed written consent was obtained from the patient after a discussion of the risks, benefits and alternatives to treatment. A timeout was performed prior to the initiation of the procedure. Initial ultrasound was performed to localize ascites. The right lower abdomen was prepped and draped in the usual sterile fashion. 1% lidocaine was used for local anesthesia. Following this, a 6 Fr Safe-T-Centesis catheter was introduced. An ultrasound image was saved for documentation purposes. The paracentesis was performed. The catheter was removed and a dressing was applied. The patient tolerated the procedure well without immediate post procedural complication. FINDINGS: A total of approximately 2.8 L of yellow fluid was removed. IMPRESSION: Successful ultrasound-guided paracentesis yielding 2.8 liters of peritoneal fluid. Electronically Signed   By: GAletta EdouardM.D.   On: 03/28/2018 16:31   [place]acent[place]  Result Date: 03/14/2018 INDICATION: Pancreatic carcinoma and ascites. EXAM: ULTRASOUND GUIDED PARACENTESIS MEDICATIONS: None. COMPLICATIONS: None immediate. PROCEDURE: Informed written consent was obtained from the patient after a discussion of the risks, benefits and alternatives to treatment. A timeout was performed prior to the initiation of the procedure. Initial ultrasound was performed to localize ascites. The right lower abdomen was prepped and draped in the usual sterile fashion. 1% lidocaine with epinephrine was used for local anesthesia. Following this, a 6  Fr Safe-T-Centesis catheter was introduced. An ultrasound image was saved for documentation purposes. The paracentesis was performed. The catheter was removed and a dressing was applied. The patient tolerated the procedure well without immediate post procedural complication. FINDINGS: A total of approximately 2.7 L of clear, yellow fluid was removed. A sample was sent for cytologic analysis. IMPRESSION: Successful ultrasound-guided paracentesis yielding 2.7 liters of peritoneal fluid. Electronically Signed   By: GAletta EdouardM.D.   On: 03/14/2018 16:21   Ct Outside Films Body  Result Date: 03/09/2018 This examination belongs to an outside facility and is stored here for comparison purposes only.  Contact the originating outside institution for any associated report or interpretation.   PERFORMANCE STATUS (ECOG) : 4 - Bedbound  Review of Systems As noted above. Otherwise, a complete review of systems is negative.  Physical Exam General: Profoundly cachectic, ill-appearing, lying in bed Cardiovascular: regular rate and rhythm Pulmonary: clear ant fields Abdomen: soft, nontender, + bowel sounds Extremities: no edema Skin: no rashes Neurological: Poorly responsive, wakes when stimulated, answer simple questions but is confused  IMPRESSION: Patient was transferred from  ER to room.  He received hydromorphone in ER and is now sedated.  I cannot have a conversation with patient in his present condition, nor does it appear that he currently has capacity for decision-making.  Patient is ill-appearing.  It appears that he is quickly approaching end-of-life.    I met with patient's fianc.  She recognizes that patient is likely approaching end-of-life.  Patient's decision making is complicated as he does not have a healthcare power of attorney.  In the event the patient is unable to make his own decisions, we would likely have to rely on patient's adult children.  Patient's son is reportedly in  route to the hospital and I plan to meet with him to clarify plan and CODE STATUS.  I note that patient apparently opted to be a full code in the ER.  Patient's fianc says she recognizes the probable futility of this decision and would like to defer decisions to his son.  We will plan to meet with son when he arrives.  Patient has a 43-year-old child in the home.  Ellene Route is interested in [facility] involvement for counseling and support.  We will ask social worker to make a referral tomorrow.    PLAN: Continue supportive care Would recommend DNR Plan to meet with son when he arrives [facility] referral Chaplain consult for emotional support  Time Total: 70 minutes  Visit consisted of counseling and education dealing with the complex and emotionally intense issues of symptom management and palliative care in the setting of serious and potentially life-threatening illness.Greater than 50%  of this time was spent counseling and coordinating care related to the above assessment and plan.  Signed by: Altha Harm, PhD, DNP, NP-C, [facility] [phone] (Work Cell)

## 2018-03-28 NOTE — Progress Notes (Deleted)
[place] OFFICE PROGRESS NOTE  Patient Care Team: Maeola Sarah, [name], 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; [name]; [name]; [name]; OINO]- close margins; [name];   # [name] [June 3rd to sep 3rd,2019]; progression [CT Oct 10th 2019- [facility]- ascites/1.6 lits; cytology pos-Adeno ca];    # October, 22nd 2019- Gem- Abraxane  # Stomach- H.Pylori [[facility]] S/p prevpack [may 2019];Oct 2019- Portal/Hepatic Vein Thrombosis [Eliquis] ---------------------------------------------------   # Nov 2018-biliary obstruction status post ERCP; [name]; [name]; [name]; [name], [name], [name], and PALB2 c.1655A>G (p.Gln552Arg. This is still considered a normal result.  # Molecular testing- Not done -------------------------------------------------   DIAGNOSIS:  Pancreatic cancer  STAGE:  IV       ;GOALS: pallaitive  CURRENT/MOST RECENT THERAPY- Gem-Abraxane          Malignant tumor of body of pancreas ([place])   10/11/2017 - 01/24/2018 Chemotherapy    The patient had gemcitabine (GEMZAR) 1,600 mg in sodium chloride 0.9 % 250 mL chemo infusion, 1,672 mg, Intravenous,  Once, 4 of 6 cycles Dose modification: 900 mg/m2 (original dose 1,000 mg/m2, Cycle 3, Reason: Provider Judgment), 800 mg/m2 (original dose 1,000 mg/m2, Cycle 4, Reason:  Provider Judgment) Administration: 1,600 mg (10/24/2017), 1,600 mg (11/07/2017), 1,600 mg (11/21/2017), 1,600 mg (11/28/2017), 1,400 mg (12/26/2017), 1,400 mg (01/02/2018), 1,400 mg (01/09/2018)  for chemotherapy treatment.     03/13/2018 -  Chemotherapy    The patient had gemcitabine (GEMZAR) 1,600 mg in sodium chloride 0.9 % 250 mL chemo infusion, 1,596 mg, Intravenous,  Once, 1 of 4 cycles Administration: 1,600 mg (03/16/2018) ondansetron (ZOFRAN) 8 mg, dexamethasone (DECADRON) 10 mg in sodium chloride 0.9 % 50 mL IVPB, , Intravenous,  Once, 1 of 4 cycles PACLitaxel-protein bound (ABRAXANE) chemo infusion 200 mg, 125 mg/m2 = 200 mg, Intravenous, Once, 1 of 4 cycles Administration: 200 mg (03/16/2018)  for chemotherapy treatment.        INTERVAL HISTORY:  Nathaniel Saunders 47 y.o.  male with recurrent/stage IV pancreatic cancer with ascites status post cycle #1 of gemcitabine and Abraxane last week is here for follow-up.  Patient is currently on fentanyl patch 75 mcg; however taking oxycodone 10 mg once or twice a day.  He complains of pain in his right hip bone/lower back.  No bladder or bowel incontinence.  Complains of poor appetite.  Weight loss.  Fatigue.  Denies any tingling or numbness.  Abdominal distention not significantly worse.  Leg swelling.  Review of Systems  Constitutional: Positive for weight loss. Negative for chills, diaphoresis, fever and malaise/fatigue.  HENT: Negative for nosebleeds and sore throat.   Eyes: Negative for double vision.  Respiratory: Negative for cough, hemoptysis, sputum production, shortness of breath and wheezing.   Cardiovascular: Positive for leg swelling. Negative for chest pain, palpitations and orthopnea.  Gastrointestinal: Positive for abdominal pain, nausea and vomiting. Negative for blood in stool, constipation, heartburn and melena.  Genitourinary: Negative for dysuria, frequency  and urgency.  Musculoskeletal: Positive for back pain. Negative  for joint pain.  Skin: Negative.  Negative for itching and rash.  Neurological: Negative for dizziness, tingling, focal weakness, weakness and headaches.  Endo/Heme/Allergies: Does not bruise/bleed easily.  Psychiatric/Behavioral: Negative for depression. The patient is not nervous/anxious and does not have insomnia.       PAST MEDICAL HISTORY :  Past Medical History:  Diagnosis Date  . Asthma   . Genetic testing 05/20/2017   Multi-Cancer panel (83 genes) @ [facility] - No pathogenic mutations detected  . Pancreatic cancer ([place])   . Pancreatic mass     PAST SURGICAL HISTORY :   Past Surgical History:  Procedure Laterality Date  . ERCP N/A 04/21/2017   Procedure: ENDOSCOPIC RETROGRADE CHOLANGIOPANCREATOGRAPHY (ERCP);  Surgeon: Lucilla Lame, MD;  Location[facility] ENDOSCOPY;  Service: Endoscopy;  Laterality: N/A;  . ERCP N/A 05/06/2017   Procedure: ENDOSCOPIC RETROGRADE CHOLANGIOPANCREATOGRAPHY (ERCP);  Surgeon: Lucilla Lame, MD;  Location: [facility] ENDOSCOPY;  Service: Endoscopy;  Laterality: N/A;  . PANCREATECTOMY, PROXIMAL SUBTOTAL WITH TOTAL DUODENECTOMY, PARTIAL GASTRECTOMY, CHOLEDOCHOENTEROSTOMY AND GASTROJEJUNOSTOMY (WHIPPLE-TYPE PROCEDURE); WITHOUT PANCREATOJEJUNOSTOMY  09/06/2017  . PORTA CATH INSERTION N/A 04/18/2017   Procedure: PORTA CATH INSERTION;  Surgeon: Algernon Huxley, MD;  Location: [place] CV LAB;  Service: Cardiovascular;  Laterality: N/A;    FAMILY HISTORY :   Family History  Problem Relation Age of Onset  . Pancreatic cancer Mother 105       deceased 24  . Diabetes Father   . Colon cancer Maternal Grandmother 70       deceased 55    SOCIAL HISTORY:   Social History   Tobacco Use  . Smoking status: Former Smoker    Packs/day: 0.25    Years: 5.00    Pack years: 1.25    Types: Cigarettes    Last attempt to quit: 05/07/2015    Years since quitting: 2.8  . Smokeless tobacco: Never Used  Substance Use Topics  . Alcohol use: No  . Drug use: No     ALLERGIES:  has No Known Allergies.  MEDICATIONS:  Current Outpatient Medications  Medication Sig Dispense Refill  . acetaminophen (TYLENOL) 325 MG tablet Take 2 tablets (650 mg total) by mouth every 6 (six) hours as needed for mild pain or fever (headache). 20 tablet 0  . albuterol (PROVENTIL HFA;VENTOLIN HFA) 108 (90 Base) MCG/ACT inhaler Inhale 1-2 puffs every 6 (six) hours as needed into the lungs for wheezing or shortness of breath.    . fentaNYL (DURAGESIC - DOSED MCG/HR) 75 MCG/HR Place 1 patch (75 mcg total) onto the skin every 3 (three) days. 10 patch 0  . ondansetron (ZOFRAN) 8 MG tablet One pill every 8 hours as needed for nausea/vomitting. (Patient not taking: Reported on 03/21/2018) 30 tablet 0  . Oxycodone HCl 10 MG TABS Take 1 tablet (10 mg total) by mouth every 6 (six) hours as needed for severe pain or breakthrough pain. 60 tablet 0  . pantoprazole (PROTONIX) 40 MG tablet Take 1 tablet by mouth daily.     No current facility-administered medications for this visit.    Facility-Administered Medications Ordered in Other Visits  Medication Dose Route Frequency Provider Last Rate Last Dose  . sodium chloride flush (NS) 0.9 % injection 10 mL  10 mL Intravenous PRN Cammie Sickle, MD   10 mL at 06/15/17 0941    PHYSICAL EXAMINATION: ECOG PERFORMANCE STATUS: 0 - Asymptomatic  There were no vitals taken for this  visit.  There were no vitals filed for this visit.  Physical Exam  Constitutional: He is oriented to person, place, and time.  Patient is accompanied by fianc . he is in mild distress because of abdominal pain.  Cachectic appearing.  Positive for temporal wasting  HENT:  Head: Normocephalic and atraumatic.  Mouth/Throat: Oropharynx is clear and moist. No oropharyngeal exudate.  Eyes: Pupils are equal, round, and reactive to light.  Neck: Normal range of motion. Neck supple.  Cardiovascular: Normal rate and regular rhythm.  Pulmonary/Chest: No  respiratory distress. He has no wheezes.  Abdominal: Soft. Bowel sounds are normal. He exhibits distension. He exhibits no mass. There is tenderness. There is no rebound and no guarding.  No rigidity or guarding.  Abdominal tenderness generalized more so in the right lower quadrant.  Musculoskeletal: Normal range of motion. He exhibits no edema or tenderness.  Neurological: He is alert and oriented to person, place, and time.  Skin: Skin is warm.  Psychiatric: Affect normal.   LABORATORY DATA:  I have reviewed the data as listed    Component Value Date/Time   NA 134 (L) 03/16/2018 0947   K 4.3 03/16/2018 0947   CL 101 03/16/2018 0947   CO2 27 03/16/2018 0947   GLUCOSE 109 (H) 03/16/2018 0947   BUN 26 (H) 03/16/2018 0947   CREATININE 0.62 03/16/2018 0947   CALCIUM 9.1 03/16/2018 0947   PROT 7.6 03/16/2018 0947   ALBUMIN 3.4 (L) 03/16/2018 0947   AST 60 (H) 03/16/2018 0947   ALT 55 (H) 03/16/2018 0947   ALKPHOS 89 03/16/2018 0947   BILITOT 0.9 03/16/2018 0947   GFRNONAA >60 03/16/2018 0947   GFRAA >60 03/16/2018 0947    No results found for: SPEP, UPEP  Lab Results  Component Value Date   WBC 5.7 03/16/2018   NEUTROABS 4.3 03/16/2018   HGB 12.7 (L) 03/16/2018   HCT 37.0 (L) 03/16/2018   MCV 92.7 03/16/2018   PLT 255 03/16/2018      Chemistry      Component Value Date/Time   NA 134 (L) 03/16/2018 0947   K 4.3 03/16/2018 0947   CL 101 03/16/2018 0947   CO2 27 03/16/2018 0947   BUN 26 (H) 03/16/2018 0947   CREATININE 0.62 03/16/2018 0947      Component Value Date/Time   CALCIUM 9.1 03/16/2018 0947   ALKPHOS 89 03/16/2018 0947   AST 60 (H) 03/16/2018 0947   ALT 55 (H) 03/16/2018 0947   BILITOT 0.9 03/16/2018 0947       RADIOGRAPHIC STUDIES: I have personally reviewed the radiological images as listed and agreed with the findings in the report. No results found.   ASSESSMENT & PLAN:  No problem-specific Assessment & Plan notes found for this  encounter.   No orders of the defined types were placed in this encounter.  All questions were answered. The patient knows to call the clinic with any problems, questions or concerns.      Cammie Sickle, MD 03/28/2018 8:21 AM

## 2018-03-28 NOTE — Progress Notes (Signed)
Patient remains lethargic and ill-appearing.  He is confused when awake.  Patient does not currently have capacity for decision-making.  Per family, patient does not have a healthcare power of attorney.  Patient is apparently married but is separated and estranged from his wife.  I met with patient's son and daughter.  Both tell me that patient's wife is not in the picture and would not be reachable for decision-making.  In the absence of patient's spouse and a healthcare power of attorney, we would need to rely on son and daughter to help guide [place] with decision-making.  I updated son and daughter on patient's medical problems including stage IV pancreatic cancer.  We talked about the possibility that patient was nearing end-of-life.  We discussed the option of work-up and treatment versus comfort care.  Son and daughter both verbalized a desire to take patient home and keep him comfortable.  Apparently, patient has communicated to the family that he would want to die in his bed at home and not in the hospital.  We discussed hospice involvement either at home or a hospice facility.  Again, family communicated a desire to take him home but would like to involve hospice in his care.  Son and daughter say that they are not interested in aggressive work-up or treatment at this point and instead just want to focus on his comfort.  We discussed CODE STATUS in detail.  Both son and daughter say that they would not want patient to be resuscitated or have his life prolonged artificially.  DNR order signed.  Dr. Leslye Peer updated.   I completed a MOST form today, which was signed by patient's daughter. The family outlined their wishes for the following treatment decisions:  Cardiopulmonary Resuscitation: Do Not Attempt Resuscitation (DNR/No CPR)  Medical Interventions: Comfort Measures: Keep clean, warm, and dry. Use medication by any route, positioning, wound care, and other measures to relieve pain and  suffering. Use oxygen, suction and manual treatment of airway obstruction as needed for comfort. Do not transfer to the hospital unless comfort needs cannot be met in current location.  Antibiotics: Left Blank  IV Fluids: Left Blank  Feeding Tube: Left Blank    Plan: DNR Most form completed as outlined above Comfort care Care management referral for hospice

## 2018-03-28 NOTE — Progress Notes (Signed)
Patient ID: Nathaniel Saunders, male   DOB: Mar 11, 1971, 47 y.o.   MRN: [id number]  Since palliative care made DNR and comfort measure, I canceled th GI consult and will hold on ercp and stent exchange.  Dr Leslye Peer

## 2018-03-28 NOTE — ED Notes (Signed)
Attempted to call report. Unable to give report at this time  

## 2018-03-28 NOTE — Progress Notes (Signed)
Patient lethargic and less responsive than earlier in the shift. Checked BG--19. Gave 62mL D50. Recheck BG--109. Gave patient food/drink to sustain BG level. Patient responsive, answering questions, alert, etc. Contacted MD Jannifer Franklin). See new orders for repeat BG checks q 1 hour for 3 hours--return call to MD to determine if BG checks need to continue at 1 hr frequency.

## 2018-03-28 NOTE — ED Provider Notes (Addendum)
[facility] Emergency Department Provider Note   ____________________________________________    I have reviewed the triage vital signs and the nursing notes.   HISTORY  Chief Complaint Back Pain     HPI Nathaniel Saunders is a 47 y.o. male with a history of stage IV pancreatic cancer for which she sees Dr. Rogue Bussing of oncology.  Patient went to appointment today and was sent to the emergency department because of severe cancer associated pain that has not been controlled with p.o. medications.  No reports of fevers.  Patient complains of 10 out of 10 low back pain.  Recent bone scan on review of records does not demonstrate any metastases.   Past Medical History:  Diagnosis Date  . Asthma   . Genetic testing 05/20/2017   Multi-Cancer panel (83 genes) @ [facility] - No pathogenic mutations detected  . Pancreatic cancer ([place])   . Pancreatic mass     Patient Active Problem List   Diagnosis Date Noted  . Ascites, malignant 03/13/2018  . Goals of care, counseling/discussion 03/07/2018  . Community acquired pneumonia 08/06/2017  . Pneumonia 08/06/2017  . Flu 07/31/2017  . Genetic testing 05/20/2017  . Pancreatic mass   . Obstruction of bile duct   . Port-A-Cath in place 04/29/2017  . Biliary obstruction 04/29/2017  . Obstructive jaundice due to malignant neoplasm ([place]) 04/21/2017  . Malignant neoplasm of pancreas ([place])   . Extrahepatic obstructive biliary disease   . Malignant tumor of body of pancreas ([place]) 03/24/2017    Past Surgical History:  Procedure Laterality Date  . ERCP N/A 04/21/2017   Procedure: ENDOSCOPIC RETROGRADE CHOLANGIOPANCREATOGRAPHY (ERCP);  Surgeon: Lucilla Lame, MD;  Location: [facility] ENDOSCOPY;  Service: Endoscopy;  Laterality: N/A;  . ERCP N/A 05/06/2017   Procedure: ENDOSCOPIC RETROGRADE CHOLANGIOPANCREATOGRAPHY (ERCP);  Surgeon: Lucilla Lame, MD;  Location: [facility] ENDOSCOPY;  Service: Endoscopy;  Laterality: N/A;  .  PANCREATECTOMY, PROXIMAL SUBTOTAL WITH TOTAL DUODENECTOMY, PARTIAL GASTRECTOMY, CHOLEDOCHOENTEROSTOMY AND GASTROJEJUNOSTOMY (WHIPPLE-TYPE PROCEDURE); WITHOUT PANCREATOJEJUNOSTOMY  09/06/2017  . PORTA CATH INSERTION N/A 04/18/2017   Procedure: PORTA CATH INSERTION;  Surgeon: Algernon Huxley, MD;  Location: [place] CV LAB;  Service: Cardiovascular;  Laterality: N/A;    Prior to Admission medications   Medication Sig Start Date End Date Taking? Authorizing Provider  acetaminophen (TYLENOL) 325 MG tablet Take 2 tablets (650 mg total) by mouth every 6 (six) hours as needed for mild pain or fever (headache). 08/08/17  Yes Vaughan Basta, MD  fentaNYL (DURAGESIC - DOSED MCG/HR) 75 MCG/HR Place 1 patch (75 mcg total) onto the skin every 3 (three) days. 03/16/18  Yes Cammie Sickle, MD  Oxycodone HCl 10 MG TABS Take 1 tablet (10 mg total) by mouth every 6 (six) hours as needed for severe pain or breakthrough pain. 03/16/18  Yes Cammie Sickle, MD     Allergies Patient has no known allergies.  Family History  Problem Relation Age of Onset  . Pancreatic cancer Mother 6       deceased 55  . Diabetes Father   . Colon cancer Maternal Grandmother 75       deceased 58    Social History Social History   Tobacco Use  . Smoking status: Former Smoker    Packs/day: 0.25    Years: 5.00    Pack years: 1.25    Types: Cigarettes    Last attempt to quit: 05/07/2015    Years since quitting: 2.8  . Smokeless tobacco: Never Used  Substance Use Topics  . Alcohol use: No  . Drug use: No    Review of Systems  Constitutional: No fever/chills Eyes: No changes ENT: No throat swelling Cardiovascular: Denies chest pain. Respiratory: No cough Gastrointestinal: no vomiting.   Genitourinary: Negative for dysuria. Musculoskeletal: As above Skin: Negative for rash. Neurological: Negative for headaches    ____________________________________________   PHYSICAL EXAM:  VITAL  SIGNS: ED Triage Vitals [03/28/18 1125]  Enc Vitals Group     BP 116/84     Pulse Rate 83     Resp 16     Temp      Temp src      SpO2 98 %     Weight 53.5 kg (118 lb)     Height 1.6 m (5\' 3" )     Head Circumference      Peak Flow      Pain Score 8     Pain Loc      Pain Edu?      Excl. in [place]?     Constitutional: Alert and oriented, cachectic, uncomfortable Eyes: Conjunctivae are icteric Head: Atraumatic. Nose: No congestion/rhinnorhea. Mouth/Throat: Mucous membranes are dry  Cardiovascular: Normal rate, regular rhythm.  Good peripheral circulation. Respiratory: Normal respiratory effort.  No retractions.  Gastrointestinal: Soft and nontender. No distention.    Musculoskeletal:  Warm and well perfused Neurologic:  Normal speech and language. No gross focal neurologic deficits are appreciated.  Skin:  Skin is warm, dry and intact. No rash noted.   ____________________________________________   LABS (all labs ordered are listed, but only abnormal results are displayed)  Labs Reviewed  CBC - Abnormal; Notable for the following components:      Result Value   RBC 3.77 (*)    Hemoglobin 12.0 (*)    HCT 34.5 (*)    Platelets 95 (*)    All other components within normal limits  COMPREHENSIVE METABOLIC PANEL - Abnormal; Notable for the following components:   Sodium 132 (*)    Glucose, Bld 54 (*)    BUN 68 (*)    Creatinine, Ser 0.48 (*)    Calcium 8.2 (*)    Albumin 2.8 (*)    AST 1,636 (*)    ALT 790 (*)    Alkaline Phosphatase 814 (*)    Total Bilirubin 3.2 (*)    All other components within normal limits  LIPASE, BLOOD  PROTIME-INR   ____________________________________________  EKG  None ____________________________________________  RADIOLOGY  None ____________________________________________   PROCEDURES  Procedure(s) performed: No  Procedures   Critical Care performed: No ____________________________________________   INITIAL  IMPRESSION / ASSESSMENT AND PLAN / ED COURSE  Pertinent labs & imaging results that were available during my care of the patient were reviewed by me and considered in my medical decision making (see chart for details).  Patient with stage IV pancreatic cancer, Dr. Aletha Halim notes palliative care is the primary option at this point.  Patient given IV Dilaudid with good relief of pain.  Patient's LFTs have significantly increased from 2 weeks ago.  Discussed with Dr. Rogue Bussing who requests admission for palliative care evaluation and pain control    ____________________________________________   FINAL CLINICAL IMPRESSION(S) / ED DIAGNOSES  Final diagnoses:  Cancer associated pain  Acute liver failure without hepatic coma        Note:  This document was prepared using Dragon voice recognition software and may include unintentional dictation errors.    Lavonia Drafts, MD 03/28/18 [phone]  Lavonia Drafts, MD 03/28/18 [phone]

## 2018-03-28 NOTE — ED Triage Notes (Signed)
Pt is cancer pt , he called his oncologist and they told him to come here to admitted for pain management. Pt reports that his back pain is unbearable, unable to sleep or get comfortable.

## 2018-03-28 NOTE — H&P (Signed)
[place] [place] NAME: Nathaniel Saunders    MR#:  [id number]  DATE OF BIRTH:  [birth year]  DATE OF ADMISSION:  03/28/2018  PRIMARY CARE PHYSICIAN: Maeola Sarah, MD   REQUESTING/REFERRING PHYSICIAN: Dr. Lavonia Drafts  CHIEF COMPLAINT:   Chief Complaint  Patient presents with  . Back Pain    HISTORY OF PRESENT ILLNESS:  Nathaniel Saunders  is a 47 y.o. male with a known history of metastatic pancreatic cancer.  As per family doctor Monday sent him in for pain control and he did not want to do any further chemotherapy.  The patient is having abdominal pain and back pain.  He has had quite a bit of weight loss over the past year from 230 pounds down to 118 pounds.  Over the last week he has lost about 10 pounds.  He has been feeling very tired.  He did have some sweating this morning but no fever.  Patient is not the best historian because he was just given some IV Dilaudid before I went in there.  Family states that has been having swelling of his abdomen and his lower extremities.  PAST MEDICAL HISTORY:   Past Medical History:  Diagnosis Date  . Asthma   . Genetic testing 05/20/2017   Multi-Cancer panel (83 genes) @ [facility] - No pathogenic mutations detected  . Pancreatic cancer ([place])   . Pancreatic mass   . Portal vein thrombosis     PAST SURGICAL HISTORY:   Past Surgical History:  Procedure Laterality Date  . ERCP N/A 04/21/2017   Procedure: ENDOSCOPIC RETROGRADE CHOLANGIOPANCREATOGRAPHY (ERCP);  Surgeon: Lucilla Lame, MD;  Location: [facility] ENDOSCOPY;  Service: Endoscopy;  Laterality: N/A;  . ERCP N/A 05/06/2017   Procedure: ENDOSCOPIC RETROGRADE CHOLANGIOPANCREATOGRAPHY (ERCP);  Surgeon: Lucilla Lame, MD;  Location: [facility] ENDOSCOPY;  Service: Endoscopy;  Laterality: N/A;  . PANCREATECTOMY, PROXIMAL SUBTOTAL WITH TOTAL DUODENECTOMY, PARTIAL GASTRECTOMY, CHOLEDOCHOENTEROSTOMY AND GASTROJEJUNOSTOMY (WHIPPLE-TYPE PROCEDURE); WITHOUT  PANCREATOJEJUNOSTOMY  09/06/2017  . PORTA CATH INSERTION N/A 04/18/2017   Procedure: PORTA CATH INSERTION;  Surgeon: Algernon Huxley, MD;  Location: [place] CV LAB;  Service: Cardiovascular;  Laterality: N/A;    SOCIAL HISTORY:   Social History   Tobacco Use  . Smoking status: Former Smoker    Packs/day: 0.25    Years: 5.00    Pack years: 1.25    Types: Cigarettes    Last attempt to quit: 05/07/2015    Years since quitting: 2.8  . Smokeless tobacco: Never Used  Substance Use Topics  . Alcohol use: No    FAMILY HISTORY:   Family History  Problem Relation Age of Onset  . Pancreatic cancer Mother 72       deceased 48  . Diabetes Father   . Colon cancer Maternal Grandmother 71       deceased 81  . Pancreatic cancer Maternal Grandmother     DRUG ALLERGIES:  No Known Allergies  REVIEW OF SYSTEMS:  CONSTITUTIONAL: No fever, positive sweats this morning.  Positive for weight loss 10 pounds in last week and over 100 pounds in the last year.  Positive for fatigue.  EYES: No blurred or double vision.  EARS, NOSE, AND THROAT: No tinnitus or ear pain. No sore throat.  Positive for runny nose. RESPIRATORY: No cough, shortness of breath, wheezing or hemoptysis.  CARDIOVASCULAR: No chest pain, orthopnea, edema.  GASTROINTESTINAL: No nausea, vomiting, diarrhea.positive for abdominal pain. No blood in bowel movements GENITOURINARY: No dysuria,  hematuria.  ENDOCRINE: No polyuria, nocturia,  HEMATOLOGY: No anemia, easy bruising or bleeding SKIN: No rash or lesion. MUSCULOSKELETAL: No joint pain or arthritis.   NEUROLOGIC: No tingling, numbness, weakness.  PSYCHIATRY: No anxiety or depression.   MEDICATIONS AT HOME:   Prior to Admission medications   Medication Sig Start Date End Date Taking? Authorizing Provider  acetaminophen (TYLENOL) 325 MG tablet Take 2 tablets (650 mg total) by mouth every 6 (six) hours as needed for mild pain or fever (headache). 08/08/17  Yes Vaughan Basta, MD  fentaNYL (DURAGESIC - DOSED MCG/HR) 75 MCG/HR Place 1 patch (75 mcg total) onto the skin every 3 (three) days. 03/16/18  Yes Cammie Sickle, MD  Oxycodone HCl 10 MG TABS Take 1 tablet (10 mg total) by mouth every 6 (six) hours as needed for severe pain or breakthrough pain. 03/16/18  Yes Cammie Sickle, MD      VITAL SIGNS:  Blood pressure (!) 120/96, pulse 73, resp. rate 20, height 5\' 3"  (1.6 m), weight 53.5 kg, SpO2 97 %.  PHYSICAL EXAMINATION:  GENERAL:  47 y.o.-year-old patient lying in the bed with no acute distress.  EYES: Pupils equal, round, reactive to light and accommodation.  Slight scleral icterus. Extraocular muscles intact.  HEENT: Head atraumatic, normocephalic. Oropharynx and nasopharynx clear.  NECK:  Supple, no jugular venous distention. No thyroid enlargement, no tenderness.  LUNGS: Normal breath sounds bilaterally, no wheezing, rales,rhonchi or crepitation. No use of accessory muscles of respiration.  CARDIOVASCULAR: S1, S2 normal. No murmurs, rubs, or gallops.  ABDOMEN: Soft, tender entire abdomen., distended. Bowel sounds present. No organomegaly or mass.  EXTREMITIES: 2+ pedal edema, no cyanosis, or clubbing.  NEUROLOGIC: Cranial nerves II through XII are intact. Muscle strength 5/5 in all extremities. Sensation intact. Gait not checked.  PSYCHIATRIC: The patient is alert and oriented x 3.  SKIN: No rash, lesion, or ulcer.   LABORATORY PANEL:   CBC Recent Labs  Lab 03/28/18 1223  WBC 5.7  HGB 12.0*  HCT 34.5*  PLT 95*   ------------------------------------------------------------------------------------------------------------------  Chemistries  Recent Labs  Lab 03/28/18 1223  NA 132*  K 4.7  CL 99  CO2 26  GLUCOSE 54*  BUN 68*  CREATININE 0.48*  CALCIUM 8.2*  AST 1,636*  ALT 790*  ALKPHOS 814*  BILITOT 3.2*    ------------------------------------------------------------------------------------------------------------------    IMPRESSION AND PLAN:   1.  Metastatic pancreatic cancer.  Dr. Rogue Bussing sent into the hospital for pain control.  CODE STATUS discussed and patient is a full code.  I am going to be limited on how much pain control I can get secondary to the patient's CODE STATUS.  We will get a palliative care consultation. 2.  Ascites and abdominal pain.  Will get an ultrasound paracentesis to rule out SBP.  Empiric Rocephin 3.  Elevated liver function test and bilirubin.  Patient has had a history of biliary stents in the past.  Wondering if we need to do this again.  I will get a GI consultation. 4.  History of portal vein thrombosis.  Supposed to be on Lovenox twice a day at home but sometimes only takes once a day.  Will hold it at this point since the patient may end up needing an ERCP and I will also get a paracentesis.  All the records are reviewed and case discussed with ED provider. Management plans discussed with the patient, family and they are in agreement.  CODE STATUS: full code  TOTAL TIME  TAKING CARE OF THIS PATIENT: 50 minutes, including acp time.    Loletha Grayer M.D on 03/28/2018 at 3:07 PM  Between 7am to 6pm - Pager - [phone]  After 6pm call admission pager [phone]  [facility]  [phone]  CC: Primary care physician; Maeola Sarah, MD

## 2018-03-29 ENCOUNTER — Other Ambulatory Visit: Payer: Self-pay | Admitting: Internal Medicine

## 2018-03-29 ENCOUNTER — Telehealth: Payer: Self-pay | Admitting: *Deleted

## 2018-03-29 DIAGNOSIS — M7989 Other specified soft tissue disorders: Secondary | ICD-10-CM

## 2018-03-29 DIAGNOSIS — R140 Abdominal distension (gaseous): Secondary | ICD-10-CM

## 2018-03-29 DIAGNOSIS — C799 Secondary malignant neoplasm of unspecified site: Principal | ICD-10-CM

## 2018-03-29 DIAGNOSIS — R634 Abnormal weight loss: Secondary | ICD-10-CM

## 2018-03-29 DIAGNOSIS — I81 Portal vein thrombosis: Secondary | ICD-10-CM

## 2018-03-29 DIAGNOSIS — R630 Anorexia: Secondary | ICD-10-CM

## 2018-03-29 DIAGNOSIS — J45909 Unspecified asthma, uncomplicated: Secondary | ICD-10-CM

## 2018-03-29 DIAGNOSIS — Z66 Do not resuscitate: Secondary | ICD-10-CM

## 2018-03-29 DIAGNOSIS — K5900 Constipation, unspecified: Secondary | ICD-10-CM

## 2018-03-29 DIAGNOSIS — G893 Neoplasm related pain (acute) (chronic): Secondary | ICD-10-CM

## 2018-03-29 DIAGNOSIS — C259 Malignant neoplasm of pancreas, unspecified: Secondary | ICD-10-CM

## 2018-03-29 DIAGNOSIS — R7989 Other specified abnormal findings of blood chemistry: Secondary | ICD-10-CM

## 2018-03-29 DIAGNOSIS — Z87891 Personal history of nicotine dependence: Secondary | ICD-10-CM

## 2018-03-29 DIAGNOSIS — L8990 Pressure ulcer of unspecified site, unspecified stage: Secondary | ICD-10-CM

## 2018-03-29 LAB — BASIC METABOLIC PANEL
ANION GAP: 5 (ref 5–15)
BUN: 50 mg/dL — ABNORMAL HIGH (ref 6–20)
CHLORIDE: 104 mmol/L (ref 98–111)
CO2: 26 mmol/L (ref 22–32)
CREATININE: 0.56 mg/dL — AB (ref 0.61–1.24)
Calcium: 7.7 mg/dL — ABNORMAL LOW (ref 8.9–10.3)
GFR calc non Af Amer: >60 mL/min (ref >60)
GLUCOSE: 103 mg/dL — AB (ref 70–99)
Potassium: 5.2 mmol/L — ABNORMAL HIGH (ref 3.5–5.1)
Sodium: 135 mmol/L (ref 135–145)

## 2018-03-29 LAB — GLUCOSE, CAPILLARY
GLUCOSE-CAPILLARY: 120 mg/dL — AB (ref 70–99)
GLUCOSE-CAPILLARY: 139 mg/dL — AB (ref 70–99)
GLUCOSE-CAPILLARY: 67 mg/dL — AB (ref 70–99)
GLUCOSE-CAPILLARY: 86 mg/dL (ref 70–99)
Glucose-Capillary: 43 mg/dL — CL (ref 70–99)
Glucose-Capillary: 55 mg/dL — ABNORMAL LOW (ref 70–99)
Glucose-Capillary: 72 mg/dL (ref 70–99)
Glucose-Capillary: 76 mg/dL (ref 70–99)
Glucose-Capillary: 78 mg/dL (ref 70–99)
Glucose-Capillary: 80 mg/dL (ref 70–99)
Glucose-Capillary: 91 mg/dL (ref 70–99)
Glucose-Capillary: 91 mg/dL (ref 70–99)

## 2018-03-29 LAB — CBC
HEMATOCRIT: 38.3 % — AB (ref 39.0–52.0)
Hemoglobin: 13.3 g/dL (ref 13.0–17.0)
MCH: 31.7 pg (ref 26.0–34.0)
MCHC: 34.7 g/dL (ref 30.0–36.0)
MCV: 91.2 fL (ref 80.0–100.0)
NRBC: 0 % (ref 0.0–0.2)
Platelets: 91 10*3/uL — ABNORMAL LOW (ref 150–400)
RBC: 4.2 MIL/uL — AB (ref 4.22–5.81)
RDW: 14.8 % (ref 11.5–15.5)
WBC: 4.8 10*3/uL (ref 4.0–10.5)

## 2018-03-29 LAB — PROTEIN, BODY FLUID (OTHER): TOTAL PROTEIN, BODY FLUID OTHER: 1 g/dL

## 2018-03-29 MED ORDER — DEXTROSE 50 % IV SOLN
25.0000 mL | Freq: Once | INTRAVENOUS | Status: AC
  Administered 2018-03-29: 25 mL via INTRAVENOUS

## 2018-03-29 MED ORDER — DEXTROSE 50 % IV SOLN
INTRAVENOUS | Status: AC
  Administered 2018-03-29: 25 mL via INTRAVENOUS
  Filled 2018-03-29: qty 50

## 2018-03-29 MED ORDER — DEXTROSE-NACL 5-0.45 % IV SOLN
INTRAVENOUS | Status: DC
  Administered 2018-03-29 (×2): via INTRAVENOUS

## 2018-03-29 MED ORDER — MORPHINE SULFATE (CONCENTRATE) 20 MG/ML PO SOLN: 10.0000 mg | ORAL | 0 refills | Status: AC | PRN

## 2018-03-29 MED ORDER — DEXTROSE 50 % IV SOLN
INTRAVENOUS | Status: AC
  Administered 2018-03-29: 17:00:00 25 mL via INTRAVENOUS
  Filled 2018-03-29: qty 50

## 2018-03-29 MED ORDER — LORAZEPAM 0.5 MG PO TABS
0.5000 mg | ORAL_TABLET | ORAL | Status: DC | PRN
  Administered 2018-03-29: 14:00:00 0.5 mg via ORAL
  Filled 2018-03-29: qty 1

## 2018-03-29 MED ORDER — LORAZEPAM 0.5 MG PO TABS: 0.5000 mg | ORAL_TABLET | ORAL | 0 refills | Status: AC | PRN

## 2018-03-29 NOTE — Progress Notes (Signed)
I met with patient's daughter, Alexa.  Patient remains alert but confused.  Daughter was hopeful that patient would be able to complete a power of attorney document today.  However, I question patient's capacity and understanding at the present time.  With significant prompting, he can identify that he is in [place], [place].  However, he is unable to appropriately answer other orientation questions in a timely manner.  He also does not seem to recognize the significance of his current healthcare problems.  Daughter confirms plan to take him home with hospice today.  Case discussed with hospice liaison who is coordinating discharge.  Recommend dose of IV hydromorphone prior to transport as patient seems somewhat uncomfortable. 

## 2018-03-29 NOTE — Telephone Encounter (Signed)
Hassan Rowan- yes; I could be the attending.

## 2018-03-29 NOTE — Care Management Note (Addendum)
Case Management Note  Patient Details  Name: Nathaniel Saunders MRN: [id number] Date of Birth: 20-Mar-1971  Subjective/Objective:   Admitted to [facility] with the diagnosis of pancreatic cancer.  Fiance is  Nathaniel Saunders [phone]).    Lives with family. Dr. Karl Pock is listed as primary care physician.  Family (son & daughter)  met  with Palliative Care representative [name]  Daughter is Nathaniel Saunders [phone])  and son is Nathaniel Saunders [phone]). .   Would like to take their father home with hospice services in the home.            Action/Plan: Received referral for Hospice services in the home. Will contact son/daughter to discuss agencies . Spoke with son, Nathaniel Saunders. Discussed Hospice agencies. [place]. Will update [name] RN representative, Son ask to call girlfriend Nathaniel Saunders for equipment needs. Spoke with girlfriend. Will need hospital bed. States they can use urinal for voiding.   Expected Discharge Date:                  Expected Discharge Plan:     In-House Referral:   yes  Discharge planning Services   yes  Post Acute Care Choice:   yes Choice offered to:   son/daughter  DME Arranged:    DME Agency:     HH Arranged:   yes HH Agency:   [facility]  Status of Service:     If discussed at [place] of Stay Meetings, dates discussed:    Additional Comments:  [name], RN MSN CCM Care Management [phone] 03/29/2018, 9:49 AM

## 2018-03-29 NOTE — Progress Notes (Signed)
RN and CNA turned patient in attempt to find Fentanyl patch placed from home. No patch found on back, shoulders, or chest. Placed new patch per order.

## 2018-03-29 NOTE — Consult Note (Signed)
[place] CONSULT NOTE  Patient Care Team: Maeola Sarah, [name], RN as Registered Nurse  CHIEF COMPLAINTS/PURPOSE OF CONSULTATION:  Metastatic pancreatic cancer  HISTORY OF PRESENTING ILLNESS: Patient is confused; history reviewed with the family. Nathaniel Saunders 47 y.o.  male unfortunate patient with metastatic pancreatic cancer most recently on gemcitabine Abraxane cycle second line therapy is admitted to hospital for pain control.  Patient most recently had paracentesis done for refractory ascites.  Patient was supposed to follow-up in the clinic for ongoing chemotherapy.  However patient's pain has been progressively worse-and hence referred to the emergency room for further evaluation.  On evaluation the emergency room patient was noted to have elevated LFTs with bilirubin of 3.5.   Patient has been losing weight.  Poor appetite.  No nausea vomiting.  Swelling in the legs.  Positive for constipation.  No fevers or chills.  Abdominal distention.  Ros: Difficult to assess given patient's mental status changes.  MEDICAL HISTORY:  Past Medical History:  Diagnosis Date  . Asthma   . Genetic testing 05/20/2017   Multi-Cancer panel (83 genes) @ [facility] - No pathogenic mutations detected  . Pancreatic cancer ([place])   . Pancreatic mass   . Portal vein thrombosis     SURGICAL HISTORY: Past Surgical History:  Procedure Laterality Date  . ERCP N/A 04/21/2017   Procedure: ENDOSCOPIC RETROGRADE CHOLANGIOPANCREATOGRAPHY (ERCP);  Surgeon: Lucilla Lame, MD;  Location[facility] ENDOSCOPY;  Service: Endoscopy;  Laterality: N/A;  . ERCP N/A 05/06/2017   Procedure: ENDOSCOPIC RETROGRADE CHOLANGIOPANCREATOGRAPHY (ERCP);  Surgeon: Lucilla Lame, MD;  Location: [facility] ENDOSCOPY;  Service: Endoscopy;  Laterality: N/A;  . PANCREATECTOMY, PROXIMAL SUBTOTAL WITH TOTAL DUODENECTOMY, PARTIAL GASTRECTOMY, CHOLEDOCHOENTEROSTOMY AND  GASTROJEJUNOSTOMY (WHIPPLE-TYPE PROCEDURE); WITHOUT PANCREATOJEJUNOSTOMY  09/06/2017  . PORTA CATH INSERTION N/A 04/18/2017   Procedure: PORTA CATH INSERTION;  Surgeon: Algernon Huxley, MD;  Location: [place] CV LAB;  Service: Cardiovascular;  Laterality: N/A;    SOCIAL HISTORY: Social History   Socioeconomic History  . Marital status: Legally Separated    Spouse name: Not on file  . Number of children: Not on file  . Years of education: Not on file  . Highest education level: Not on file  Occupational History  . Not on file  Social Needs  . Financial resource strain: Not hard at all  . Food insecurity:    Worry: Never true    Inability: Never true  . Transportation needs:    Medical: No    Non-medical: No  Tobacco Use  . Smoking status: Former Smoker    Packs/day: 0.25    Years: 5.00    Pack years: 1.25    Types: Cigarettes    Last attempt to quit: 05/07/2015    Years since quitting: 2.8  . Smokeless tobacco: Never Used  Substance and Sexual Activity  . Alcohol use: No  . Drug use: Not Currently  . Sexual activity: Not Currently    Partners: Male  Lifestyle  . Physical activity:    Days per week: 0 days    Minutes per session: 0 min  . Stress: Only a little  Relationships  . Social connections:    Talks on phone: More than three times a week    Gets together: More than three times a week    Attends religious service: 1 to 4 times per year    Active member of club or organization: No    Attends  meetings of clubs or organizations: Never    Relationship status: Living with partner  . Intimate partner violence:    Fear of current or ex partner: No    Emotionally abused: No    Physically abused: No    Forced sexual activity: No  Other Topics Concern  . Not on file  Social History Narrative  . Not on file    FAMILY HISTORY: Family History  Problem Relation Age of Onset  . Pancreatic cancer Mother 23       deceased 60  . Diabetes Father   . Colon  cancer Maternal Grandmother 33       deceased 47  . Pancreatic cancer Maternal Grandmother     ALLERGIES:  has No Known Allergies.  MEDICATIONS:  Current Facility-Administered Medications  Medication Dose Route Frequency Provider Last Rate Last Dose  . acetaminophen (TYLENOL) tablet 650 mg  650 mg Oral Q6H PRN Loletha Grayer, MD       Or  . acetaminophen (TYLENOL) suppository 650 mg  650 mg Rectal Q6H PRN Wieting, Richard, MD      . cefTRIAXone (ROCEPHIN) 2 g in sodium chloride 0.9 % 100 mL IVPB  2 g Intravenous Q24H Loletha Grayer, MD   Stopped at 03/28/18 2225  . dextrose 5 %-0.45 % sodium chloride infusion   Intravenous Continuous Lance Coon, MD 75 mL/hr at 03/29/18 1657    . fentaNYL (DURAGESIC - dosed mcg/hr) 75 mcg  75 mcg Transdermal Q72H Loletha Grayer, MD   75 mcg at 03/29/18 0851  . HYDROmorphone (DILAUDID) injection 0.5 mg  0.5 mg Intravenous Q2H PRN Borders, Kirt Boys, NP   0.5 mg at 03/29/18 0852  . LORazepam (ATIVAN) tablet 0.5 mg  0.5 mg Oral Q4H PRN Vaughan Basta, MD   0.5 mg at 03/29/18 1410  . oxyCODONE (Oxy IR/ROXICODONE) immediate release tablet 10 mg  10 mg Oral Q4H PRN Loletha Grayer, MD   10 mg at 03/29/18 0057   Current Outpatient Medications  Medication Sig Dispense Refill  . fentaNYL (DURAGESIC - DOSED MCG/HR) 75 MCG/HR Place 1 patch (75 mcg total) onto the skin every 3 (three) days. 10 patch 0  . Oxycodone HCl 10 MG TABS Take 1 tablet (10 mg total) by mouth every 6 (six) hours as needed for severe pain or breakthrough pain. 60 tablet 0  . LORazepam (ATIVAN) 0.5 MG tablet Take 1 tablet (0.5 mg total) by mouth every 4 (four) hours as needed for anxiety. 20 tablet 0  . morphine (ROXANOL) 20 MG/ML concentrated solution Take 0.5 mLs (10 mg total) by mouth every 2 (two) hours as needed for severe pain, anxiety or shortness of breath. 30 mL 0   Facility-Administered Medications Ordered in Other Encounters  Medication Dose Route Frequency Provider  Last Rate Last Dose  . sodium chloride flush (NS) 0.9 % injection 10 mL  10 mL Intravenous PRN Cammie Sickle, MD   10 mL at 06/15/17 0941      .  PHYSICAL EXAMINATION:  Vitals:   03/29/18 0334 03/29/18 0900  BP: 91/75 111/84  Pulse: (!) 117 (!) 110  Resp: 18 18  Temp: 97.7 F (36.5 C)   SpO2: 97% 95%   Filed Weights   03/28/18 1125  Weight: 118 lb (53.5 kg)    GENERAL: Cachectic appearing male patient.  Accompanied by family. EYES: no pallor; positive for jaundice. OROPHARYNX: no thrush or ulceration. NECK: supple, no masses felt LYMPH:  no palpable lymphadenopathy in the cervical, axillary  or inguinal regions LUNGS: decreased breath sounds to auscultation at bases and  No wheeze or crackles HEART/CVS: regular rate & rhythm and no murmurs; 2+ bilateral lower extremity edema. ABDOMEN: abdomen soft, non-tender and normal bowel sounds positive abdominal distention. Musculoskeletal:no cyanosis of digits and no clubbing  PSYCH drowsy oriented x 01.   NEURO: no focal motor/sensory deficits SKIN:  no rashes or significant lesions  LABORATORY DATA:  I have reviewed the data as listed Lab Results  Component Value Date   WBC 4.8 03/29/2018   HGB 13.3 03/29/2018   HCT 38.3 (L) 03/29/2018   MCV 91.2 03/29/2018   PLT 91 (L) 03/29/2018   Recent Labs    04/22/17 0443 04/26/17 0840  03/07/18 1323 03/16/18 0947 03/28/18 1223 03/28/18 2240 03/29/18 0537  NA 137 136   < > 136 134* 132*  --  135  K 3.8 3.3*   < > 3.9 4.3 4.7  --  5.2*  CL 109 103   < > 100 101 99  --  104  CO2 22 27   < > 28 27 26   --  26  GLUCOSE 94 158*   < > 97 109* 54* 123* 103*  BUN 8 10   < > 14 26* 68*  --  50*  CREATININE 0.64 0.74   < > 0.64 0.62 0.48*  --  0.56*  CALCIUM 8.7* 8.9   < > 8.9 9.1 8.2*  --  7.7*  GFRNONAA >60 >60   < > >60 >60 >60  --  >60  GFRAA >60 >60   < > >60 >60 >60  --  >60  PROT 6.5 7.7   < > 7.3 7.6 7.0  --   --   ALBUMIN 2.9* 3.6   < > 3.6 3.4* 2.8*  --   --    AST 198* 64*   < > 30 60* 1,636*  --   --   ALT 512* 237*   < > 28 55* 790*  --   --   ALKPHOS 417* 315*   < > 77 89 814*  --   --   BILITOT 6.5* 3.7*   < > 0.9 0.9 3.2*  --   --   BILIDIR 3.0* 1.8*  --   --   --   --   --   --   IBILI 3.5* 1.9*  --   --   --   --   --   --    < > = values in this interval not displayed.    RADIOGRAPHIC STUDIES: I have personally reviewed the radiological images as listed and agreed with the findings in the report. 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 RADIOPHARMACEUTICALS:  24.072 mCi Technetium-53m MDP IV COMPARISON:  None Correlation: CT abdomen and pelvis 02/17/2018 FINDINGS: Tracer extravasation at injection site at LEFT antecubital fossa. Symmetric normal tracer distribution throughout the axial and appendicular skeleton. No focal areas of abnormal increased tracer localization seen to suggest osseous metastatic disease. Expected urinary tract and soft tissue distribution of tracer. IMPRESSION: No scintigraphic evidence of osseous metastatic disease. Electronically Signed   By: Lavonia Dana M.D.   On: 03/24/2018 07:40   US Paracentesis  Result Date: 03/28/2018 INDICATION: Metastatic pancreatic carcinoma with  malignant ascites. EXAM: ULTRASOUND GUIDED PARACENTESIS MEDICATIONS: None. COMPLICATIONS: None immediate. PROCEDURE: Informed written consent was obtained from the patient after a discussion of the risks, benefits and alternatives to treatment. A timeout was performed prior to the initiation of the procedure. Initial ultrasound was performed to localize ascites. The right lower abdomen was prepped and draped in the usual sterile fashion. 1% lidocaine was used for local anesthesia. Following this,  a 6 Fr Safe-T-Centesis catheter was introduced. An ultrasound image was saved for documentation purposes. The paracentesis was performed. The catheter was removed and a dressing was applied. The patient tolerated the procedure well without immediate post procedural complication. FINDINGS: A total of approximately 2.8 L of yellow fluid was removed. IMPRESSION: Successful ultrasound-guided paracentesis yielding 2.8 liters of peritoneal fluid. Electronically Signed   By: Aletta Edouard M.D.   On: 03/28/2018 16:31   US Paracentesis  Result Date: 03/14/2018 INDICATION: Pancreatic carcinoma and ascites. EXAM: ULTRASOUND GUIDED PARACENTESIS MEDICATIONS: None. COMPLICATIONS: None immediate. PROCEDURE: Informed written consent was obtained from the patient after a discussion of the risks, benefits and alternatives to treatment. A timeout was performed prior to the initiation of the procedure. Initial ultrasound was performed to localize ascites. The right lower abdomen was prepped and draped in the usual sterile fashion. 1% lidocaine with epinephrine was used for local anesthesia. Following this, a 6 Fr Safe-T-Centesis catheter was introduced. An ultrasound image was saved for documentation purposes. The paracentesis was performed. The catheter was removed and a dressing was applied. The patient tolerated the procedure well without immediate post procedural complication. FINDINGS: A total of approximately 2.7 L of clear, yellow fluid was removed. A sample was sent for cytologic analysis. IMPRESSION: Successful ultrasound-guided paracentesis yielding 2.7 liters of peritoneal fluid. Electronically Signed   By: Aletta Edouard M.D.   On: 03/14/2018 16:21   Dg Abd 2 Views  Result Date: 03/28/2018 CLINICAL DATA:  Abdomen pain.  Status post paracentesis. EXAM: ABDOMEN - 2 VIEW COMPARISON:  None. FINDINGS: The bowel gas pattern is normal. There is no evidence of free air. Multiple surgical clips are identified in the mid  abdomen. Ascites is noted. Consolidation of bilateral medial lung bases are noted. There is question right infra/perihilar masslike lesion. IMPRESSION: No free air identified.  Ascites. Question right infra/perihilar masslike lesion. Electronically Signed   By: Abelardo Diesel M.D.   On: 03/28/2018 18:37   Ct Outside Films Body  Result Date: 03/09/2018 This examination belongs to an outside facility and is stored here for comparison purposes only.  Contact the originating outside institution for any associated report or interpretation.   ASSESSMENT & PLAN:   # 47 year old male patient with metastatic pancreatic cancer is admitted to hospital for worsening pain.  # Metastatic pancreatic cancer-most recently on second line chemotherapy-gemcitabine Abraxane.  Progressive disease noted.  Declining performance status.  # Elevated bilirubin/elevated LFTs secondary to progressive disease.  # Progressive pain-second malignancy.  #DNR/DNI.  Recommendations: Given the overall extremely poor prognosis-declining performance status-I would not recommend any further chemotherapy.  Had multiple long discussions with the patient's family.  They agree-the patient is a poor candidate for any aggressive measures or further palliative chemotherapy.  I would recommend hospice.  I think patient's symptoms will be better controlled in hospice.  Discussed with palliative care nurse practitioner [name] borders.  As per patient's/family wishes-discharge patient home with hospice.  Thank you Dr.Vachhani for allowing me to participate in the care of your pleasant patient. Please do not hesitate to contact  me with questions or concerns in the interim.   # 60 minutes face-to-face with the patient/family discussing the above plan of care; more than 50% of time spent on prognosis/ natural history; counseling and coordination.   Cammie Sickle, MD 03/29/2018 8:07 PM

## 2018-03-29 NOTE — Telephone Encounter (Signed)
Call returned to Triage nurse Vicky at hospice with VO for Dr B to be attending

## 2018-03-29 NOTE — Progress Notes (Signed)
[place]  [phone] [phone]   Name: Nathaniel Saunders Date: 03/29/2018 MRN: [id number]  DOB: 1971-01-10  Patient Care Team: Maeola Sarah, [name], RN as Registered Nurse    REASON FOR CONSULTATION: Palliative Care consult requested for this 47 y.o. male with multiple medical problems including age for pancreatic cancer (diagnosed November 2018) status post Whipple's and neoadjuvant FOLFIRINOX currently on gemcitabine and Abraxane, malignant ascites requiring repeated paracentesis, portal vein thrombosis on Eliquis, chronic back pain on fentanyl and oxycodone.  Patient has a significant weight loss from 230 pounds a year ago to current weight of 118 pounds.  He apparently has lost another 10 pounds over the past week.  Patient was admitted to the hospital 03/28/2018 with progressive weakness and pain.  He is status post paracentesis with 2.8 L withdrawn.  Palliative care was consulted to help establish goals of care.  CODE STATUS: DNR  PAST MEDICAL HISTORY: Past Medical History:  Diagnosis Date  . Asthma   . Genetic testing 05/20/2017   Multi-Cancer panel (83 genes) @ [facility] - No pathogenic mutations detected  . Pancreatic cancer ([place])   . Pancreatic mass   . Portal vein thrombosis     PAST SURGICAL HISTORY:  Past Surgical History:  Procedure Laterality Date  . ERCP N/A 04/21/2017   Procedure: ENDOSCOPIC RETROGRADE CHOLANGIOPANCREATOGRAPHY (ERCP);  Surgeon: Lucilla Lame, MD;  Location: [facility] ENDOSCOPY;  Service: Endoscopy;  Laterality: N/A;  . ERCP N/A 05/06/2017   Procedure: ENDOSCOPIC RETROGRADE CHOLANGIOPANCREATOGRAPHY (ERCP);  Surgeon: Lucilla Lame, MD;  Location: [facility] ENDOSCOPY;  Service: Endoscopy;  Laterality: N/A;  . PANCREATECTOMY, PROXIMAL SUBTOTAL WITH TOTAL DUODENECTOMY, PARTIAL GASTRECTOMY, CHOLEDOCHOENTEROSTOMY AND GASTROJEJUNOSTOMY (WHIPPLE-TYPE  PROCEDURE); WITHOUT PANCREATOJEJUNOSTOMY  09/06/2017  . PORTA CATH INSERTION N/A 04/18/2017   Procedure: PORTA CATH INSERTION;  Surgeon: Algernon Huxley, MD;  Location: [place] CV LAB;  Service: Cardiovascular;  Laterality: N/A;    HEMATOLOGY/ONCOLOGY HISTORY:  Oncology History   # November 2018- PANCREATIC ADENOCARCINOMA pancreatic neck mass [~2.5cm];  [name]; [name]; [name]; [name]; [facility]]- close margins; [name];   # [name] [June 3rd to sep 3rd,2019]; progression [CT Oct 10th 2019- [facility]- ascites/1.6 lits; cytology pos-Adeno ca];    # October, 22nd 2019- Gem- Abraxane  # Stomach- H.Pylori [[facility]] S/p prevpack [may 2019];Oct 2019- Portal/Hepatic Vein Thrombosis [Eliqu[facility]] ---------------------------------------------------   # Nov 2018-biliary obstruction status post ERCP; [name]; [name]; [name]; [name], [name], [name], and PALB2 c.1655A>G (p.Gln552Arg. This is still considered a normal result.  # Molecular testing- Not done -------------------------------------------------   DIAGNOSIS:  Pancreatic cancer  STAGE:  IV       ;GOALS: pallaitive  CURRENT/MOST RECENT THERAPY- Gem-Abraxane          Malignant tumor of body of pancreas ([place])   10/11/2017 - 01/24/2018 Chemotherapy    The patient had gemcitabine (GEMZAR) 1,600 mg in sodium chloride 0.9 % 250 mL chemo infusion, 1,672 mg, Intravenous,  Once, 4 of 6 cycles Dose modification: 900 mg/m2 (original dose 1,000 mg/m2, Cycle 3, Reason: Provider Judgment), 800 mg/m2 (original dose 1,000  mg/m2, Cycle 4, Reason: Provider Judgment) Administration: 1,600 mg (10/24/2017), 1,600 mg (11/07/2017), 1,600 mg (11/21/2017), 1,600 mg (11/28/2017), 1,400 mg (12/26/2017), 1,400 mg (01/02/2018), 1,400 mg (01/09/2018)  for chemotherapy treatment.     03/13/2018 -  Chemotherapy    The patient had gemcitabine (GEMZAR) 1,600 mg in sodium chloride 0.9 % 250 mL chemo infusion, 1,596 mg, Intravenous,  Once, 1 of 4 cycles Administration: 1,600 mg (03/16/2018) ondansetron (ZOFRAN) 8 mg, dexamethasone (DECADRON) 10 mg in sodium chloride 0.9 % 50 mL IVPB, , Intravenous,  Once, 1 of 4 cycles PACLitaxel-protein bound (ABRAXANE) chemo infusion 200 mg, 125 mg/m2 = 200 mg, Intravenous, Once, 1 of 4 cycles Administration: 200 mg (03/16/2018)  for chemotherapy treatment.      ALLERGIES:  has No Known Allergies.  MEDICATIONS:  Current Facility-Administered Medications  Medication Dose Route Frequency Provider Last Rate Last Dose  . acetaminophen (TYLENOL) tablet 650 mg  650 mg Oral Q6H PRN Loletha Grayer, MD       Or  . acetaminophen (TYLENOL) suppository 650 mg  650 mg Rectal Q6H PRN Wieting, Richard, MD      . cefTRIAXone (ROCEPHIN) 2 g in sodium chloride 0.9 % 100 mL IVPB  2 g Intravenous Q24H Loletha Grayer, MD   Stopped at 03/28/18 2225  . dextrose 5 %-0.45 % sodium chloride infusion   Intravenous Continuous Lance Coon, MD 75 mL/hr at 03/29/18 0400    . fentaNYL (DURAGESIC - dosed mcg/hr) 75 mcg  75 mcg Transdermal Q72H Wieting, Richard, MD      . HYDROmorphone (DILAUDID) injection 0.5 mg  0.5 mg Intravenous Q2H PRN , Kirt Boys, NP      . oxyCODONE (Oxy IR/ROXICODONE) immediate release tablet 10 mg  10 mg Oral Q4H PRN Loletha Grayer, MD   10 mg at 03/29/18 0057   Facility-Administered Medications Ordered in Other Encounters  Medication Dose Route Frequency Provider Last Rate Last Dose  . sodium chloride flush (NS) 0.9 % injection 10 mL  10 mL Intravenous PRN Cammie Sickle, MD   10 mL at  06/15/17 0941    VITAL SIGNS: BP 91/75 (BP Location: Right Arm)   Pulse (!) 117   Temp 97.7 F (36.5 C) (Oral)   Resp 18   Ht _0  (1.6 m)   Wt 118 lb (53.5 kg)   SpO2 97%   BMI 20.90 kg/m  Filed Weights   03/28/18 1125  Weight: 118 lb (53.5 kg)    Estimated body mass index is 20.9 kg/m as calculated from the following:   Height as of this encounter: _1  (1.6 m).   Weight as of this encounter: 118 lb (53.5 kg).  LABS: CBC:    Component Value Date/Time   WBC 4.8 03/29/2018 0537   HGB 13.3 03/29/2018 0537   HCT 38.3 (L) 03/29/2018 0537   PLT 91 (L) 03/29/2018 0537   MCV 91.2 03/29/2018 0537   NEUTROABS 4.3 03/16/2018 0947   LYMPHSABS 0.7 03/16/2018 0947   MONOABS 0.6 03/16/2018 0947   EOSABS 0.0 03/16/2018 0947   BASOSABS 0.0 03/16/2018 0947   Comprehensive Metabolic Panel:    Component Value Date/Time   NA 135 03/29/2018 0537   K 5.2 (H) 03/29/2018 0537   CL 104 03/29/2018 0537   CO2 26 03/29/2018 0537   BUN 50 (H) 03/29/2018 0537   CREATININE 0.56 (L) 03/29/2018 0537   GLUCOSE 103 (H) 03/29/2018 0537   CALCIUM 7.7 (L) 03/29/2018 0537   AST 1,636 (H) 03/28/2018 1223  ALT 790 (H) 03/28/2018 1223   ALKPHOS 814 (H) 03/28/2018 1223   BILITOT 3.2 (H) 03/28/2018 1223   PROT 7.0 03/28/2018 1223   ALBUMIN 2.8 (L) 03/28/2018 1223    RADIOGRAPHIC STUDIES: Nm Bone Scan Whole Body  Result Date: 03/24/2018 CLINICAL DATA:  Pancreatic cancer metastatic to bone, RIGHT hip pain EXAM: NUCLEAR MEDICINE WHOLE BODY BONE SCAN TECHNIQUE: Whole body anterior and posterior images were obtained approximately 3 hours after intravenous injection of radiopharmaceutical. RADIOPHARMACEUTICALS:  24.072 mCi Technetium-22mMDP IV COMPARISON:  None Correlation: CT abdomen and pelvis 02/17/2018 FINDINGS: Tracer extravasation at injection site at LEFT antecubital fossa. Symmetric normal tracer distribution throughout the axial and appendicular skeleton. No focal areas of abnormal increased  tracer localization seen to suggest osseous metastatic disease. Expected urinary tract and soft tissue distribution of tracer. IMPRESSION: No scintigraphic evidence of osseous metastatic disease. Electronically Signed   By: MLavonia DanaM.D.   On: 03/24/2018 07:40   [place]acentesis  Result Date: 03/28/2018 INDICATION: Metastatic pancreatic carcinoma with malignant ascites. EXAM: ULTRASOUND GUIDED PARACENTESIS MEDICATIONS: None. COMPLICATIONS: None immediate. PROCEDURE: Informed written consent was obtained from the patient after a discussion of the risks, benefits and alternatives to treatment. A timeout was performed prior to the initiation of the procedure. Initial ultrasound was performed to localize ascites. The right lower abdomen was prepped and draped in the usual sterile fashion. 1% lidocaine was used for local anesthesia. Following this, a 6 Fr Safe-T-Centesis catheter was introduced. An ultrasound image was saved for documentation purposes. The paracentesis was performed. The catheter was removed and a dressing was applied. The patient tolerated the procedure well without immediate post procedural complication. FINDINGS: A total of approximately 2.8 L of yellow fluid was removed. IMPRESSION: Successful ultrasound-guided paracentesis yielding 2.8 liters of peritoneal fluid. Electronically Signed   By: GAletta EdouardM.D.   On: 03/28/2018 16:31   [place]acentesis  Result Date: 03/14/2018 INDICATION: Pancreatic carcinoma and ascites. EXAM: ULTRASOUND GUIDED PARACENTESIS MEDICATIONS: None. COMPLICATIONS: None immediate. PROCEDURE: Informed written consent was obtained from the patient after a discussion of the risks, benefits and alternatives to treatment. A timeout was performed prior to the initiation of the procedure. Initial ultrasound was performed to localize ascites. The right lower abdomen was prepped and draped in the usual sterile fashion. 1% lidocaine with epinephrine was used for local  anesthesia. Following this, a 6 Fr Safe-T-Centesis catheter was introduced. An ultrasound image was saved for documentation purposes. The paracentesis was performed. The catheter was removed and a dressing was applied. The patient tolerated the procedure well without immediate post procedural complication. FINDINGS: A total of approximately 2.7 L of clear, yellow fluid was removed. A sample was sent for cytologic analysis. IMPRESSION: Successful ultrasound-guided paracentesis yielding 2.7 liters of peritoneal fluid. Electronically Signed   By: GAletta EdouardM.D.   On: 03/14/2018 16:21   Dg Abd 2 Views  Result Date: 03/28/2018 CLINICAL DATA:  Abdomen pain.  Status post paracentesis. EXAM: ABDOMEN - 2 VIEW COMPARISON:  None. FINDINGS: The bowel gas pattern is normal. There is no evidence of free air. Multiple surgical clips are identified in the mid abdomen. Ascites is noted. Consolidation of bilateral medial lung bases are noted. There is question right infra/perihilar masslike lesion. IMPRESSION: No free air identified.  Ascites. Question right infra/perihilar masslike lesion. Electronically Signed   By: WAbelardo DieselM.D.   On: 03/28/2018 18:37   Ct Outside Films Body  Result Date: 03/09/2018 This examination belongs to an outside  facility and is stored here for comparison purposes only.  Contact the originating outside institution for any associated report or interpretation.   PERFORMANCE STATUS (ECOG) : 4 - Bedbound  Review of Systems As noted above. Otherwise, a complete review of systems is negative.  Physical Exam General: sarcopenia, ill appearing, lying in bed Cardiovascular: regular rate and rhythm Pulmonary: clear ant fields Abdomen: soft, nontender, + bowel sounds GU: no suprapubic tenderness Extremities: no edema, no joint deformities Skin: no rashes Neurological: wakes when stimulated, confused, only oriented to person  IMPRESSION: Hypoglycemia overnight.  Patient more  alert this morning.  However, he remains quite confused.  He kept repeating "[place], [place]" in response to questions.  No family present.  Plan as established yesterday was to go home with hospice at the first available opportunity.  Care management consult pending to help coordinate discharge.  Case discussed with Dr. Rogue Bussing.  PLAN: Comfort care Home with hospice    Time Total: 15 minutes  Visit consisted of counseling and education dealing with the complex and emotionally intense issues of symptom management and palliative care in the setting of serious and potentially life-threatening illness.Greater than 50%  of this time was spent counseling and coordinating care related to the above assessment and plan.  Signed by: Altha Harm, PhD, DNP, NP-C, [facility] [phone] (Work Cell)

## 2018-03-29 NOTE — Telephone Encounter (Signed)
Patient discharging from [facility] today and referral made. Asking if Dr B will sign as attending physician. Please Nathaniel Saunders

## 2018-03-29 NOTE — Progress Notes (Signed)
New referral for [facility] services at home received from [facility] following a Palliative Medicine consult. Patient is a 47 year old man with stage IV Pancreatic cancer admitted to [facility] on 11/5 with progressive weakness and pain. Paracentesis performed on 11/5 with 2.8 liters of fluid drawn off. Patient and his family have met with Palliative NP [name] Borders and have decided to focus on comfort with the support of hospice services at home.  Writer met with patient's daughter Alexa in the room to initiate education regarding hospice services, philosophy and team approach to care with understanding voiced. Alexa has requested a hospital bed and plan is for discharge home today via EMS with signed DNR in place. Patient information faxed to referral. Patient appeared restless and uncomfortable through out the visit. Patient repositioned. He continued to decline pain medication when asked. He currently has a 75 mcg fentanyl patch in place and uses oxycodone IR 10 mg for break through, he has had a dose today as well as a dose of PRN IV dilaudid.  Discharge medications dsicussed with NP [name] and attending physician Dr. Anselm Jungling. Hospital care team updated. Patient information faxed to referral. Will continue to follow through discharge. Thank you for the opportunity to be involved in the care of this patient and his family. [name] RN, BSN, [facility] [place], hospital liaison [phone]

## 2018-03-29 NOTE — Plan of Care (Signed)
  Problem: Education: Goal: Knowledge of General Education information will improve Description Including pain rating scale, medication(s)/side effects and non-pharmacologic comfort measures Outcome: Progressing   Problem: Clinical Measurements: Goal: Cardiovascular complication will be avoided Outcome: Progressing   Problem: Nutrition: Goal: Adequate nutrition will be maintained Outcome: Progressing   Problem: Elimination: Goal: Will not experience complications related to bowel motility Outcome: Progressing Goal: Will not experience complications related to urinary retention Outcome: Progressing   Problem: Safety: Goal: Ability to remain free from injury will improve Outcome: Progressing   Problem: Skin Integrity: Goal: Risk for impaired skin integrity will decrease Outcome: Progressing

## 2018-03-29 NOTE — Progress Notes (Signed)
Pt. Transitioning care to hospice. EMS transport provided. Report provided by myself, assigned nurse. Prescription provided to Daughter with discharge AVS. EMTLA DNR and remaining papers in d/c packet sent with EMS transport.

## 2018-03-29 NOTE — Progress Notes (Signed)
Notified hospitalist of BG check 91 at 0245 so MD can decide if he wants to continue q1hr BG checks.

## 2018-03-29 NOTE — Plan of Care (Signed)
Pt transition home for hospice care. D5w 1/2 NS currently running. Monitoring CBG. Pt and family updated and aware of POC. No questions at current. Awaiting EMS transport. Will continue to monitor

## 2018-03-29 NOTE — Progress Notes (Signed)
Recheck patient's BG as ordered. BG 43. Gave 1/2 amp (25mL) per protocol and paged hospitalist. Recheck BG: 120 Patient has no appetite. See new orders.

## 2018-03-29 NOTE — Discharge Summary (Signed)
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 Asthma   . Genetic testing 05/20/2017   Multi-Cancer panel (83 genes) @ [facility] - No pathogenic mutations detected  . Pancreatic cancer ([place])   . Pancreatic mass   . Portal vein thrombosis     HOSPITAL COURSE:   Patient was admitted for significant pain, have metastatic pancreatic cancer and ascites with significant weight loss .family had agreed after meeting with palliative care and oncology to make him comfort care and taking home with hospice. Patient was seen in the room and discussed with multiple family members and answered their questions and queries and will discharge home with hospice.  DISCHARGE CONDITIONS:   Stable  CONSULTS OBTAINED:  Treatment Team:  Cammie Sickle, MD  DRUG ALLERGIES:  No Known Allergies  DISCHARGE MEDICATIONS:   Allergies as of 03/29/2018   No Known Allergies     Medication List    STOP taking these medications   acetaminophen 325 MG tablet Commonly known as:  TYLENOL     TAKE these medications   fentaNYL 75 MCG/HR Commonly known as:  DURAGESIC - dosed mcg/hr Place 1 patch (75 mcg total) onto the skin every 3 (three) days.   LORazepam 0.5 MG tablet Commonly known as:  ATIVAN Take 1  tablet (0.5 mg total) by mouth every 4 (four) hours as needed for anxiety.   morphine 20 MG/ML concentrated solution Commonly known as:  ROXANOL Take 0.5 mLs (10 mg total) by mouth every 2 (two) hours as needed for severe pain, anxiety or shortness of breath.   Oxycodone HCl 10 MG Tabs Take 1 tablet (10 mg total) by mouth every 6 (six) hours as needed for severe pain or breakthrough pain.        DISCHARGE INSTRUCTIONS:    Follow with hospice care at home.  If you experience worsening of your admission symptoms, develop shortness of breath, life threatening emergency, suicidal or homicidal thoughts you must seek medical attention immediately by calling 911 or calling your MD immediately  if symptoms less severe.  You Must read complete instructions/literature along with all the possible adverse reactions/side effects for all the Medicines you take and that have been prescribed to you. Take any new Medicines after you have completely understood and accept all the possible adverse reactions/side effects.   Please note  You were cared for by a hospitalist during your hospital stay. If you have any questions about your discharge medications or the care you received while you were in the hospital after you are discharged, you can call the unit and asked to speak with the hospitalist on call if the hospitalist that took care of you is not available. Once you are discharged, your primary care physician will handle any further medical issues. Please  note that NO REFILLS for any discharge medications will be authorized once you are discharged, as it is imperative that you return to your primary care physician (or establish a relationship with a primary care physician if you do not have one) for your aftercare needs so that they can reassess your need for medications and monitor your lab values.    Today   CHIEF COMPLAINT:   Chief Complaint  Patient presents with  . Back Pain    HISTORY OF  PRESENT ILLNESS:  Nathaniel Saunders  is a 47 y.o. male with a known history of metastatic pancreatic cancer.  As per family doctor Monday sent him in for pain control and he did not want to do any further chemotherapy.  The patient is having abdominal pain and back pain.  He has had quite a bit of weight loss over the past year from 230 pounds down to 118 pounds.  Over the last week he has lost about 10 pounds.  He has been feeling very tired.  He did have some sweating this morning but no fever.  Patient is not the best historian because he was just given some IV Dilaudid before I went in there.  Family states that has been having swelling of his abdomen and his lower extremities.   VITAL SIGNS:  Blood pressure 111/84, pulse (!) 110, temperature 97.7 F (36.5 C), temperature source Oral, resp. rate 18, height 5\' 3"  (1.6 m), weight 53.5 kg, SpO2 95 %.  I/O:    Intake/Output Summary (Last 24 hours) at 03/29/2018 1602 Last data filed at 03/29/2018 0400 Gross per 24 hour  Intake 360.96 ml  Output 500 ml  Net -139.04 ml    PHYSICAL EXAMINATION:   GENERAL:  47 y.o.-year-old patient lying in the bed with no acute distress.  EYES: Pupils equal, round, reactive to light and accommodation.  Slight scleral icterus. Extraocular muscles intact.  HEENT: Head atraumatic, normocephalic. Oropharynx and nasopharynx clear.  NECK:  Supple, no jugular venous distention. No thyroid enlargement, no tenderness.  LUNGS: Normal breath sounds bilaterally, no wheezing, rales,rhonchi or crepitation. No use of accessory muscles of respiration.  CARDIOVASCULAR: S1, S2 normal. No murmurs, rubs, or gallops.  ABDOMEN: Soft, tender entire abdomen., distended. Bowel sounds present. No organomegaly or mass.  EXTREMITIES: 2+ pedal edema, no cyanosis, or clubbing.  NEUROLOGIC: Cranial nerves II through XII are intact. Muscle strength 5/5 in all extremities. Sensation intact. Gait not checked.  PSYCHIATRIC: The patient is alert  and oriented x 3.  SKIN: No rash, lesion, or ulcer.   DATA REVIEW:   CBC Recent Labs  Lab 03/29/18 0537  WBC 4.8  HGB 13.3  HCT 38.3*  PLT 91*    Chemistries  Recent Labs  Lab 03/28/18 1223  03/29/18 0537  NA 132*  --  135  K 4.7  --  5.2*  CL 99  --  104  CO2 26  --  26  GLUCOSE 54*   < > 103*  BUN 68*  --  50*  CREATININE 0.48*  --  0.56*  CALCIUM 8.2*  --  7.7*  AST 1,636*  --   --   ALT 790*  --   --   ALKPHOS 814*  --   --   BILITOT 3.2*  --   --    < > = values in this interval not displayed.    Cardiac Enzymes No results for input(s): TROPONINI in the last 168 hours.  Microbiology Results  Results for orders placed or performed during the hospital encounter of 03/28/18  Body fluid culture     Status: None (Preliminary result)   Collection Time: 03/28/18  3:46 PM  Result Value Ref Range Status   Specimen Description   Final    PERITONEAL Performed at [facility], [address] [place], [place] [address]    Special Requests   Final    NONE Performed at [facility], [address] [place], [place] [address]    Gram Stain   Final    WBC PRESENT, PREDOMINANTLY MONONUCLEAR NO ORGANISMS SEEN CYTOSPIN SMEAR    Culture   Final    NO GROWTH < 12 HOURS Performed at [facility], [place] [address] [place], [place] [address]    Report Status PENDING  Incomplete    RADIOLOGY:  US Paracentesis  Result Date: 03/28/2018 INDICATION: Metastatic pancreatic carcinoma with malignant ascites. EXAM: ULTRASOUND GUIDED PARACENTESIS MEDICATIONS: None. COMPLICATIONS: None immediate. PROCEDURE: Informed written consent was obtained from the patient after a discussion of the risks, benefits and alternatives to treatment. A timeout was performed prior to the initiation of the procedure. Initial ultrasound was performed to localize ascites. The right lower abdomen was prepped and draped in the usual sterile fashion. 1% lidocaine was used for local  anesthesia. Following this, a 6 Fr Safe-T-Centesis catheter was introduced. An ultrasound image was saved for documentation purposes. The paracentesis was performed. The catheter was removed and a dressing was applied. The patient tolerated the procedure well without immediate post procedural complication. FINDINGS: A total of approximately 2.8 L of yellow fluid was removed. IMPRESSION: Successful ultrasound-guided paracentesis yielding 2.8 liters of peritoneal fluid. Electronically Signed   By: Aletta Edouard M.D.   On: 03/28/2018 16:31   Dg Abd 2 Views  Result Date: 03/28/2018 CLINICAL DATA:  Abdomen pain.  Status post paracentesis. EXAM: ABDOMEN - 2 VIEW COMPARISON:  None. FINDINGS: The bowel gas pattern is normal. There is no evidence of free air. Multiple surgical clips are identified in the mid abdomen. Ascites is noted. Consolidation of bilateral medial lung bases are noted. There is question right infra/perihilar masslike lesion. IMPRESSION: No free air identified.  Ascites. Question right infra/perihilar masslike lesion. Electronically Signed   By: Abelardo Diesel M.D.   On: 03/28/2018 18:37    EKG:   Orders placed or performed during the hospital encounter of 07/31/17  . ED EKG  . ED EKG  . EKG      Management plans discussed with the patient, family and they are in agreement.  CODE STATUS: DNR    Code Status Orders  (From admission, onward)         Start     Ordered   03/28/18 1823  Do not attempt resuscitation (DNR)  Continuous    Question Answer Comment  In the event of cardiac or respiratory ARREST Do not call a "code blue"   In the event of cardiac or respiratory ARREST Do not perform Intubation, CPR, defibrillation or ACLS   In the event of cardiac or respiratory ARREST Use medication by any route, position, wound care, and other measures to relive pain and suffering. May use oxygen, suction and manual treatment of airway obstruction as needed for comfort.       03/28/18 1822        Code Status History    Date Active Date Inactive Code Status Order ID Comments User Context   03/28/2018 1500 03/28/2018 1822 Full  Code [id number]  Loletha Grayer, MD ED   08/06/2017 2137 08/08/2017 1214 Full Code [id number]  Vaughan Basta, MD Inpatient   07/31/2017 1722 08/01/2017 1756 Full Code [id number]  Dustin Flock, MD ED   04/29/2017 1729 04/30/2017 1959 Full Code [id number]  Vaughan Basta, MD Inpatient   04/21/2017 1322 04/22/2017 1547 Full Code [id number]  Epifanio Lesches, MD ED      TOTAL TIME TAKING CARE OF THIS PATIENT: 35 minutes.    Vaughan Basta M.D on 03/29/2018 at 4:02 PM  Between 7am to 6pm - Pager - [phone]  After 6pm go to [email or web address] - password [email or web address] [facility]  Office  [phone]  CC: Primary care physician; Maeola Sarah, MD   Note: This dictation was prepared with Dragon dictation along with smaller phrase technology. Any transcriptional errors that result from this process are unintentional.

## 2018-03-30 ENCOUNTER — Telehealth: Payer: Self-pay | Admitting: *Deleted

## 2018-03-30 LAB — CYTOLOGY - NON PAP

## 2018-03-31 LAB — SLIDE CONSULT, PATHOLOGY ARMC

## 2018-04-01 LAB — BODY FLUID CULTURE: CULTURE: NO GROWTH

## 2018-04-18 IMAGING — CT CT CHEST W/ CM
1 series · 15 of 34 positions shown, 19 images · IV contrast (iopamidol)
Comparison: CT abdomen pelvis 04/01/2017; CT head of pelvis
03/23/2017.

CLINICAL DATA: Staging evaluation. Newly diagnosed pancreatic
cancer.

EXAM:
CT CHEST WITH CONTRAST
TECHNIQUE: Multidetector CT imaging of the chest was performed during
intravenous contrast administration.
CONTRAST:  75mL J3MUSV-OBB IOPAMIDOL (J3MUSV-OBB) INJECTION 61%

[Series 2: axial st · axial · 0.69mm/px · z∈[-705,-473]mm · 15 of 138 slices shown, 19 images]
[im 11/138  mediastinal]
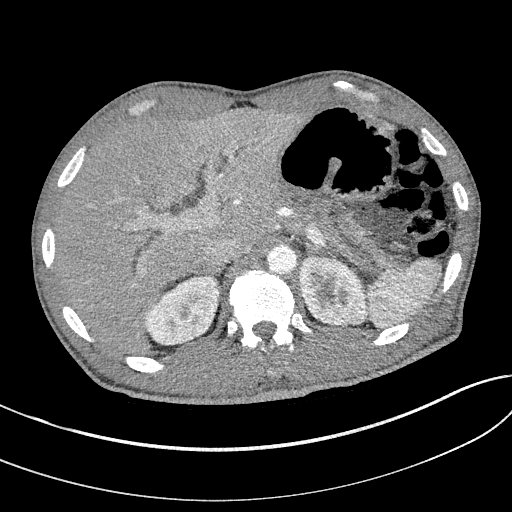
[im 11/138  lung]
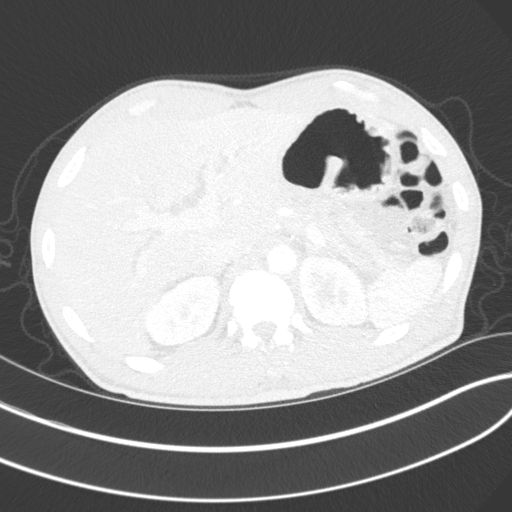
[im 21/138  lung]
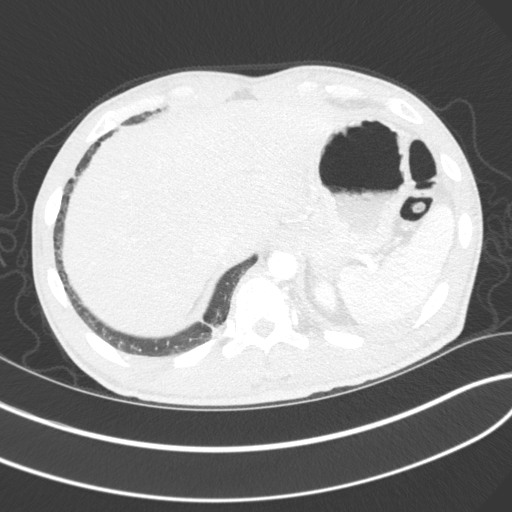
[im 28/138  lung]
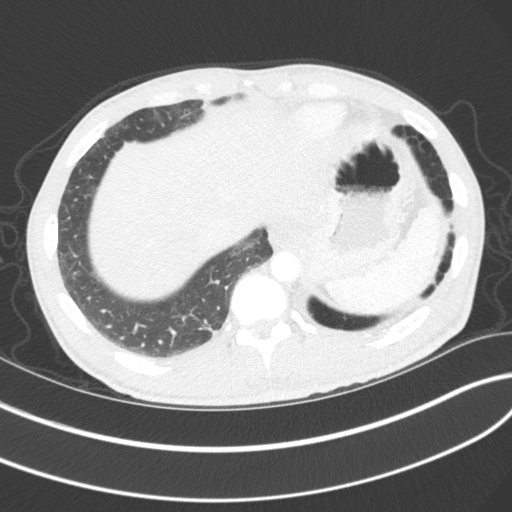
[im 36/138  lung]
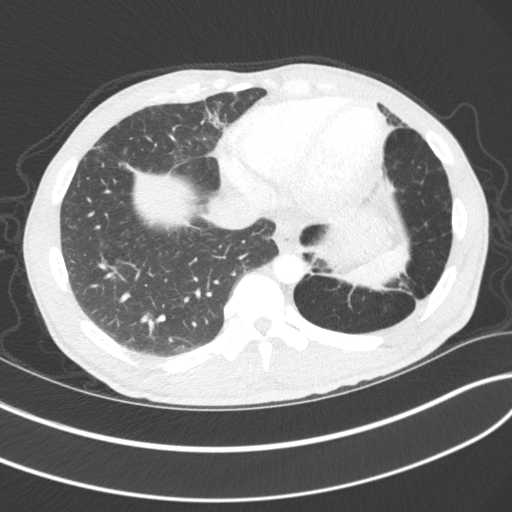
[im 46/138  mediastinal]
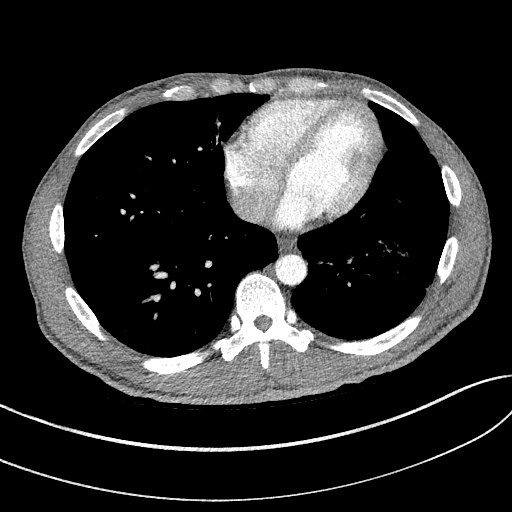
[im 46/138  lung]
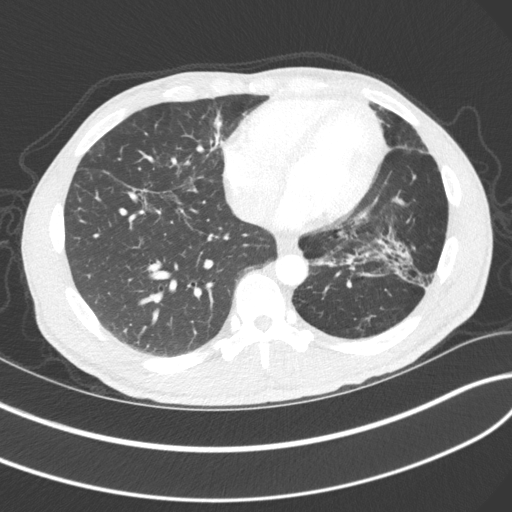
[im 55/138  lung]
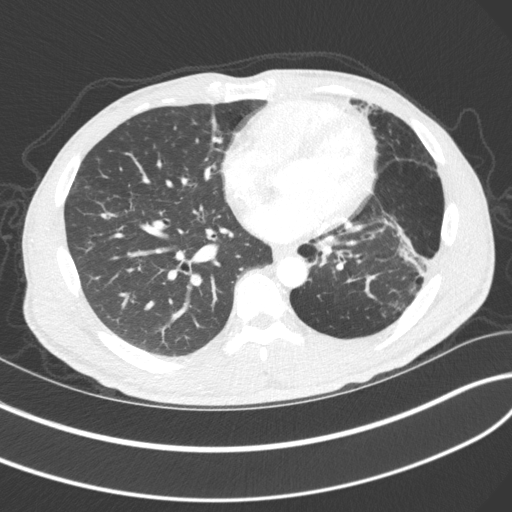
[im 61/138  lung]
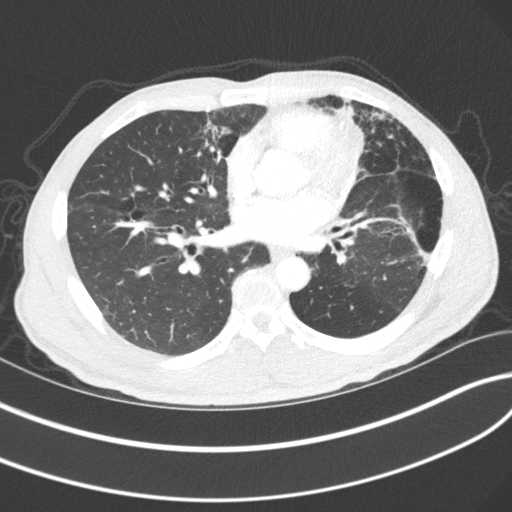
[im 72/138  lung]
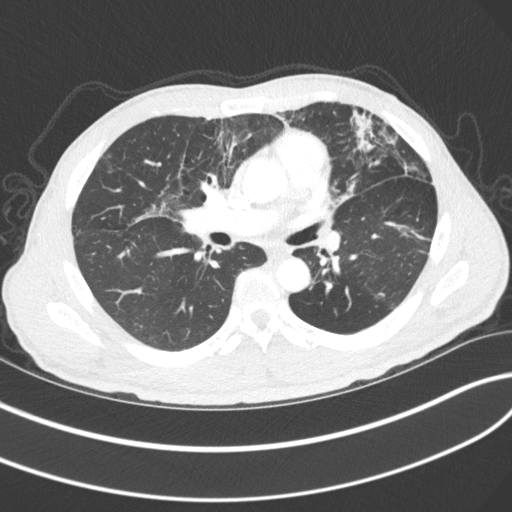
[im 77/138  mediastinal]
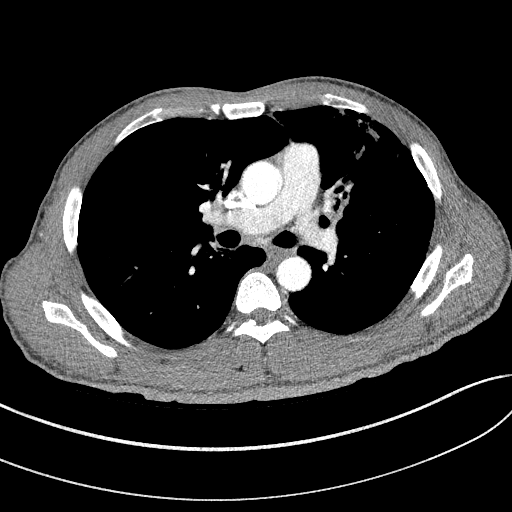
[im 77/138  lung]
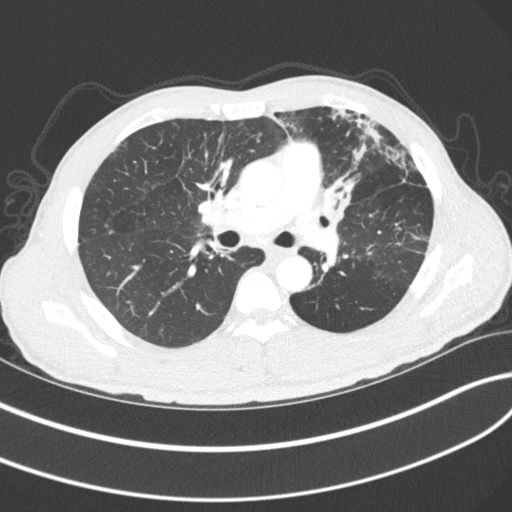
[im 83/138  lung]
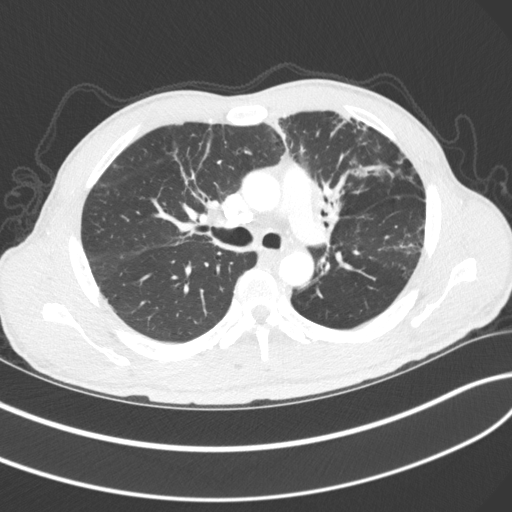
[im 92/138  lung]
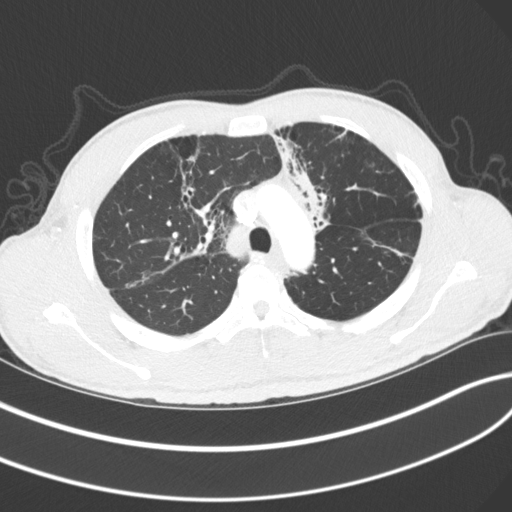
[im 102/138  lung]
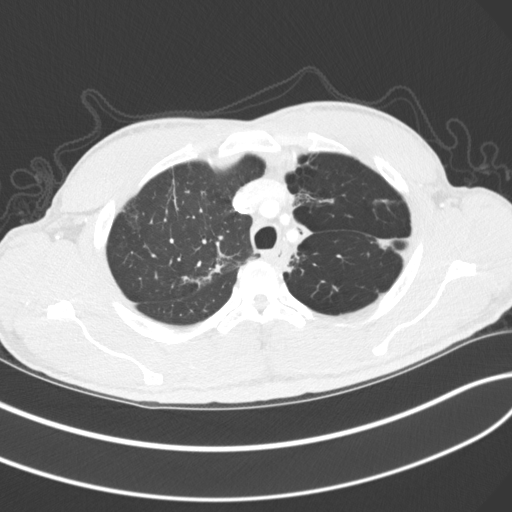
[im 110/138  mediastinal]
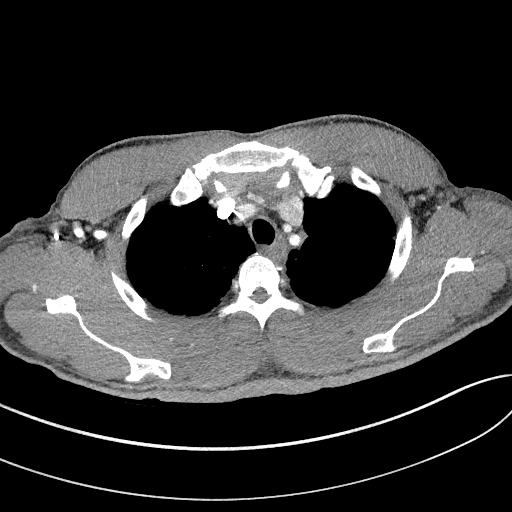
[im 110/138  lung]
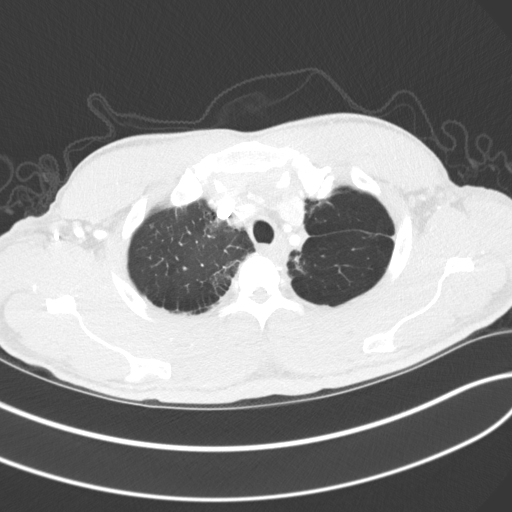
[im 117/138  lung]
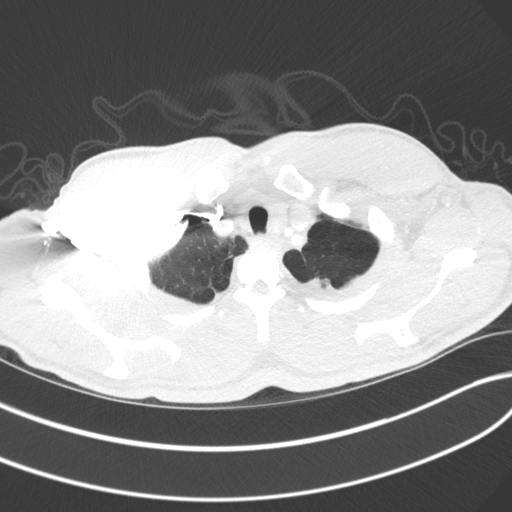
[im 127/138  lung]
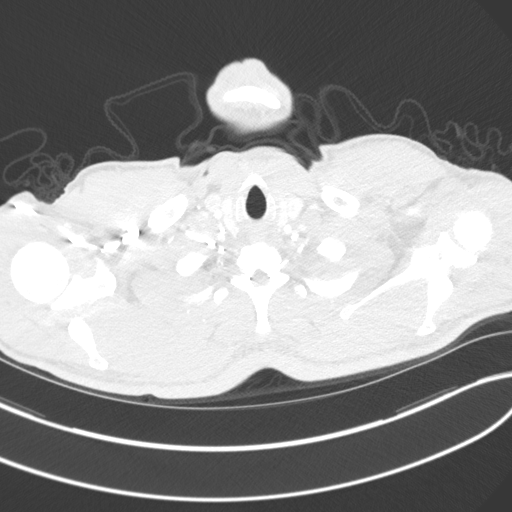

[15 of 34 positions shown; findings below may reference images not displayed]

FINDINGS: Cardiovascular: Normal heart size. No pericardial effusion. Coronary
artery vascular calcifications. Aorta main pulmonary artery normal
in caliber.

Mediastinum/Nodes: Small hiatal hernia. No enlarged axillary,
mediastinal or hilar lymphadenopathy.

Lungs/Pleura: Central airways are patent. Bilateral regions of
architectural distortion, bronchiectasis and linear opacities are
demonstrated compatible with pulmonary parenchymal scarring. There
is a 4 mm left lower lobe nodule (image 86; series 3). No pleural
effusion or pneumothorax.

Upper Abdomen: Incompletely visualized biliary ductal and pancreatic
ductal dilatation. Incompletely visualized pancreatic masses. No
acute process.

Musculoskeletal: Thoracic spine degenerative changes. No aggressive
or acute appearing osseous lesions.
IMPRESSION: 1. Bilateral pulmonary parenchymal findings most compatible with
scarring and fibrosis. Possibility of interstitial lung disease is
not excluded. Consider short-term follow-up high-resolution chest
CT.
2. No definite evidence of pulmonary metastatic disease although
evaluation is somewhat limited secondary to extensive parenchymal
scarring and fibrosis.
3. Emphysema ([id number]).

## 2018-04-23 NOTE — Telephone Encounter (Signed)
Call from Hospice that patient deceased  As of 10-30-22 AM

## 2018-04-23 DEATH — deceased

## 2019-02-20 IMAGING — CT CT RENAL STONE PROTOCOL
2 of 4 series · 16 of 46 positions shown, 18 images · non-contrast
Comparison: 02/09/2018.

CLINICAL DATA: Right flank pain for the past 3 days. Hematuria.
Undergoing chemotherapy for pancreatic cancer.

EXAM:
CT ABDOMEN AND PELVIS WITHOUT CONTRAST
TECHNIQUE: Multidetector CT imaging of the abdomen and pelvis was performed
following the standard protocol without IV contrast.

[Series 2: stone full standard · axial · 0.69mm/px · z∈[-452,-62]mm · 13 of 86 slices shown, 15 images]
[im 4/86  soft-tissue]
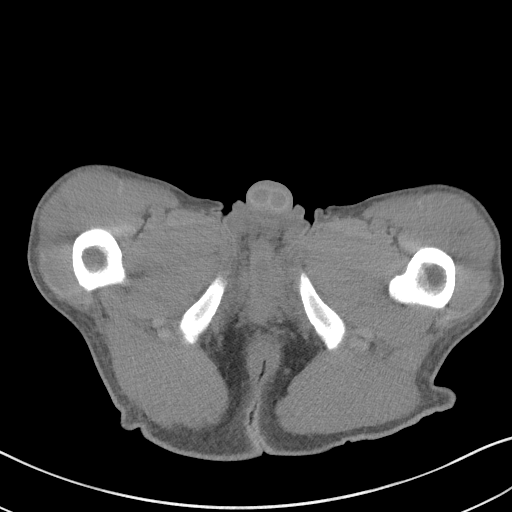
[im 4/86  bone]
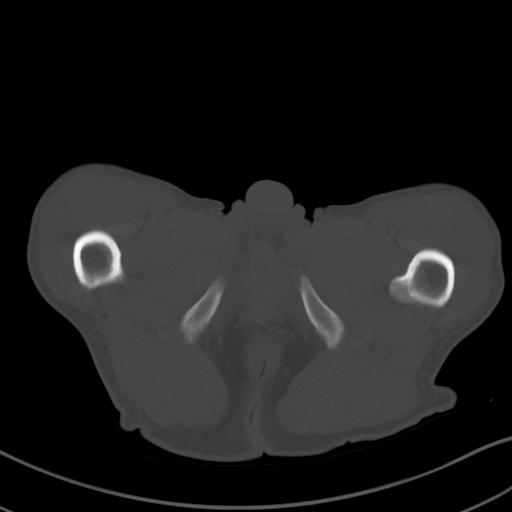
[im 11/86  soft-tissue]
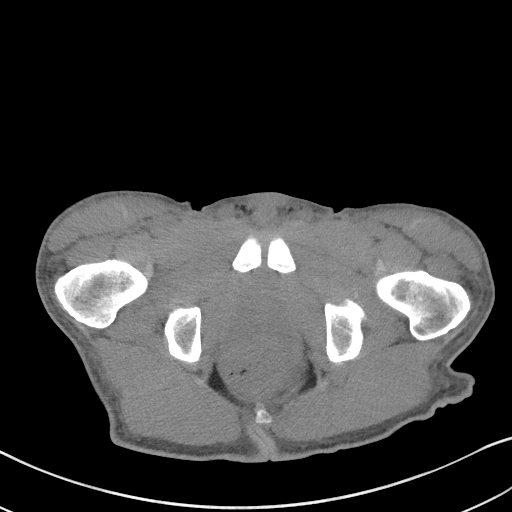
[im 18/86  soft-tissue]
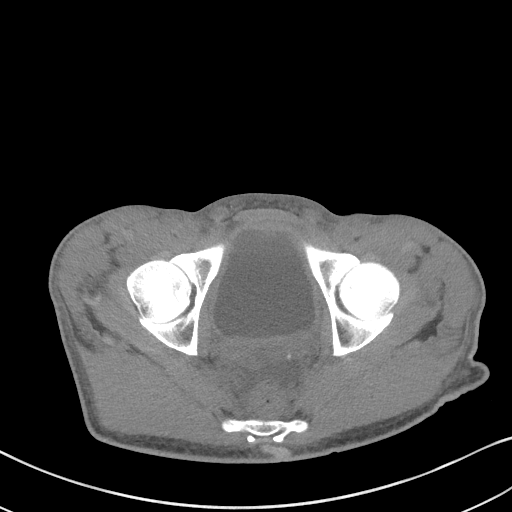
[im 24/86  soft-tissue]
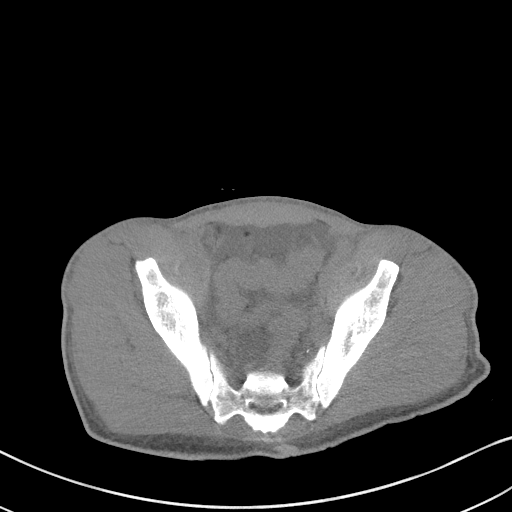
[im 31/86  soft-tissue]
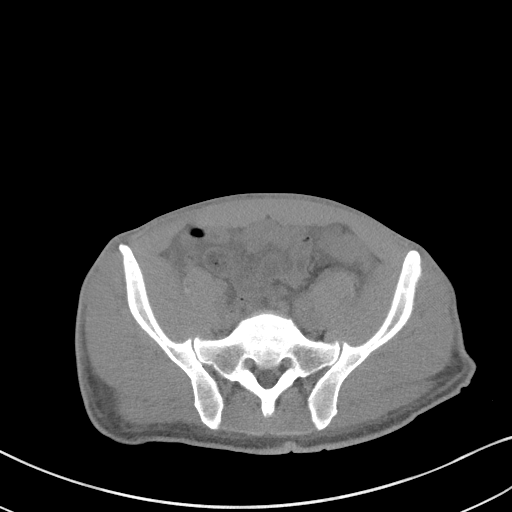
[im 38/86  soft-tissue]
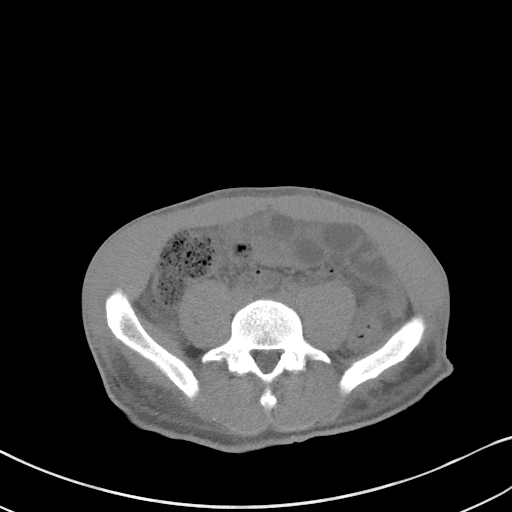
[im 45/86  soft-tissue]
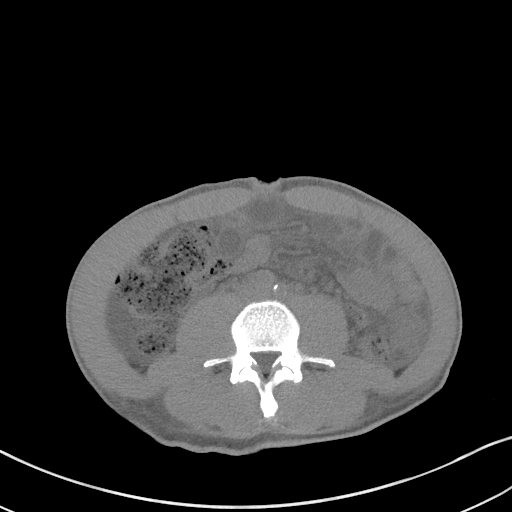
[im 48/86  soft-tissue]
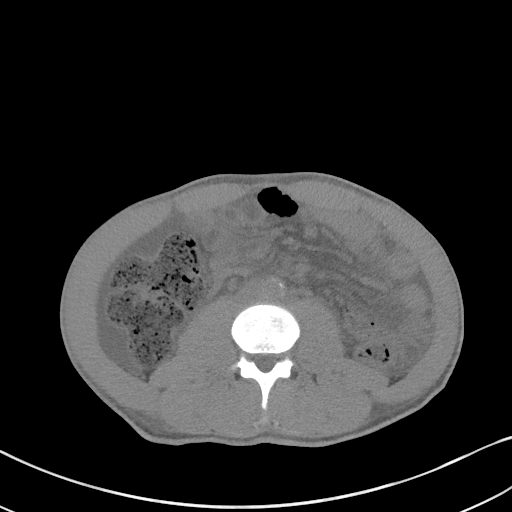
[im 55/86  soft-tissue]
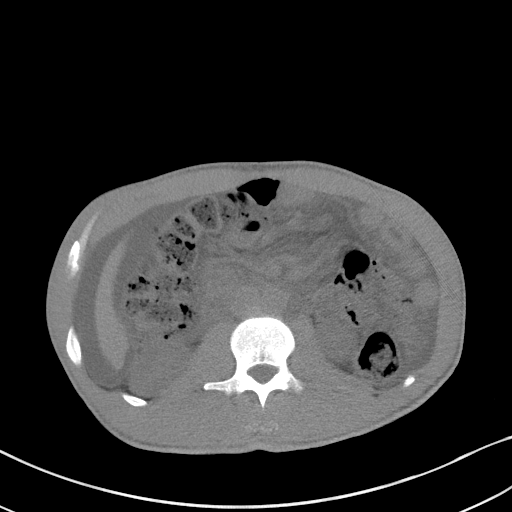
[im 55/86  bone]
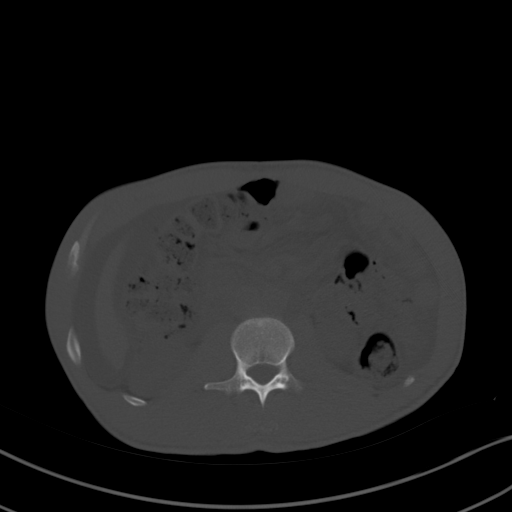
[im 62/86  soft-tissue]
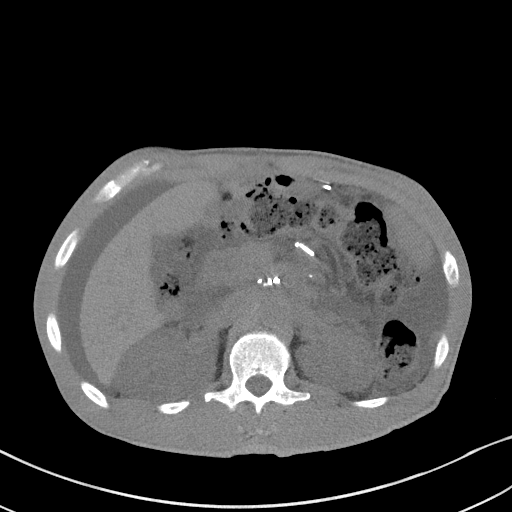
[im 69/86  soft-tissue]
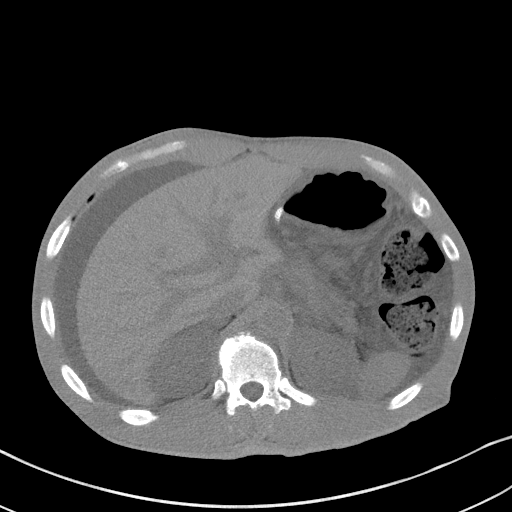
[im 75/86  soft-tissue]
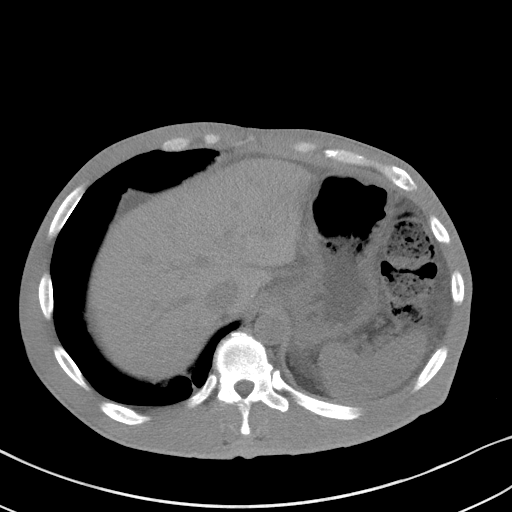
[im 82/86  soft-tissue]
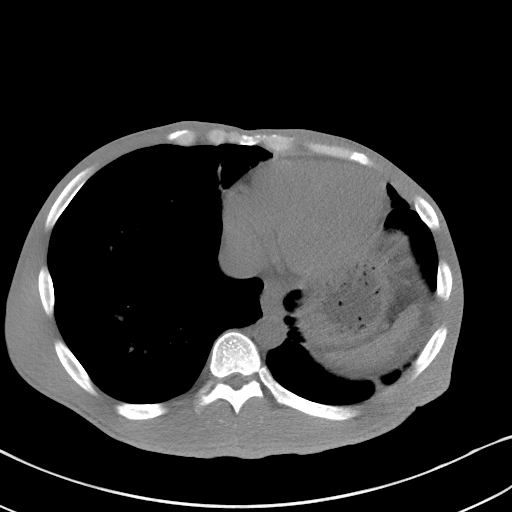

[Series 5: coronal · coronal · 0.65mm/px · 3 of 116 slices shown]
[im 39/116  soft-tissue]
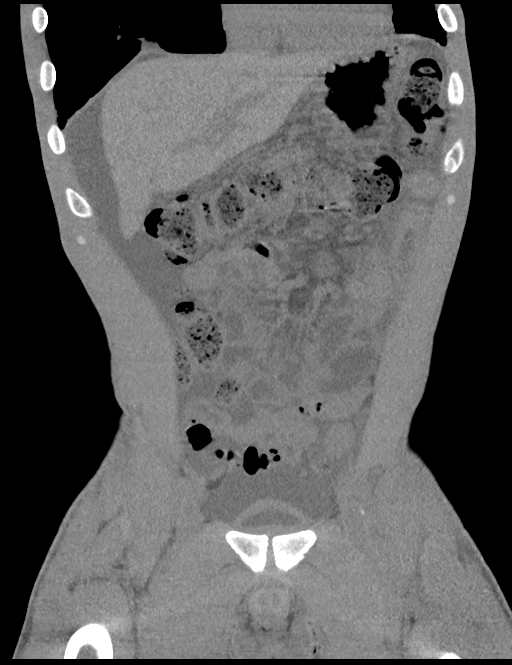
[im 52/116  soft-tissue]
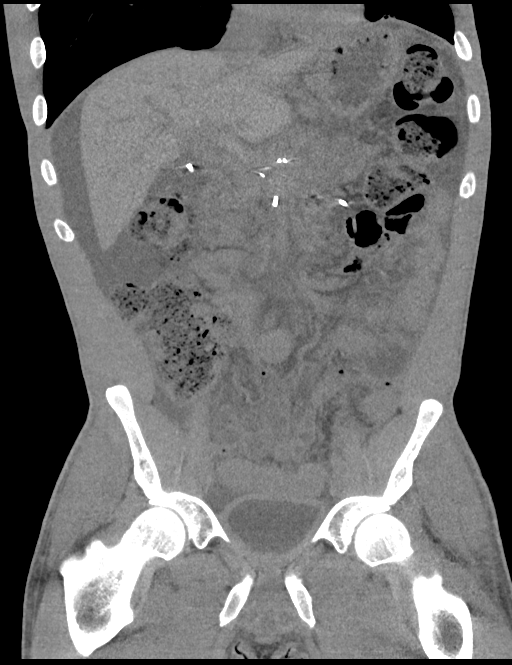
[im 64/116  soft-tissue]
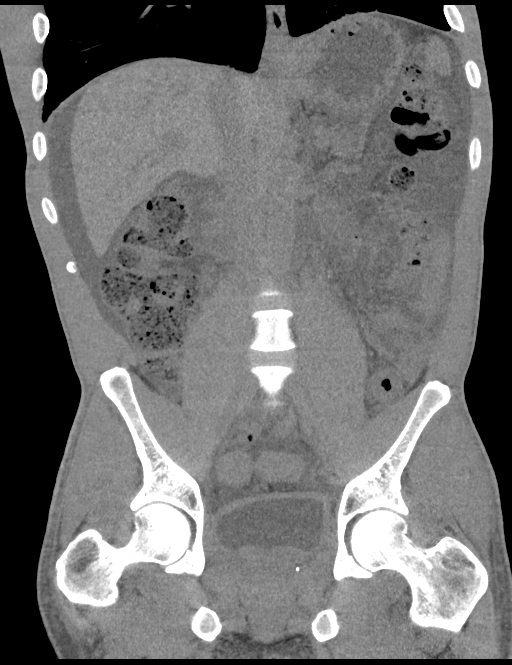

[16 of 46 positions shown; findings below may reference images not displayed]

FINDINGS: Lower chest: Stable bibasilar scarring.

Hepatobiliary: The previously demonstrated intrahepatic biliary air
is no longer demonstrated. Cholecystectomy clips are again noted.
Probable duodenum extending into the cholecystectomy bed, containing
small locules of air, similar to the previous examination.

Pancreas: Surgical absence of the head of the pancreas with
associated surgical clips. The remainder the pancreas is grossly
unremarkable.

Spleen: Normal in size without focal abnormality.

Adrenals/Urinary Tract: Previously demonstrated small right renal
cyst. Unremarkable adrenal glands, left kidney, urinary bladder and
visualized portions of the ureters.

Stomach/Bowel: Suboptimal due to the lack of intravenous and oral
contrast and lack of intraperitoneal fat. Grossly normal stomach,
small bowel and colon. No evidence of appendicitis.

Vascular/Lymphatic: Mild atheromatous arterial calcifications. No
enlarged lymph nodes.

Reproductive: Poorly visualized, grossly unremarkable prostate
gland.

Other: Small to moderate amount of free peritoneal fluid, increased.

Musculoskeletal: Mild-to-moderate thoracolumbar spine degenerative
changes. Mild bilateral hip degenerative changes.
IMPRESSION: 1. Small to moderate amount of free peritoneal fluid, increased.
2. Otherwise, no significant change, as described above.

## 2019-03-31 IMAGING — US US PARACENTESIS
1 series · 6 of 6 positions shown · non-contrast
Comparison: none

INDICATION: Pancreatic carcinoma and ascites.

[Series 1: us paracentesis · 0.26mm/px · 6 of 6 slices shown]
[im 1/6]
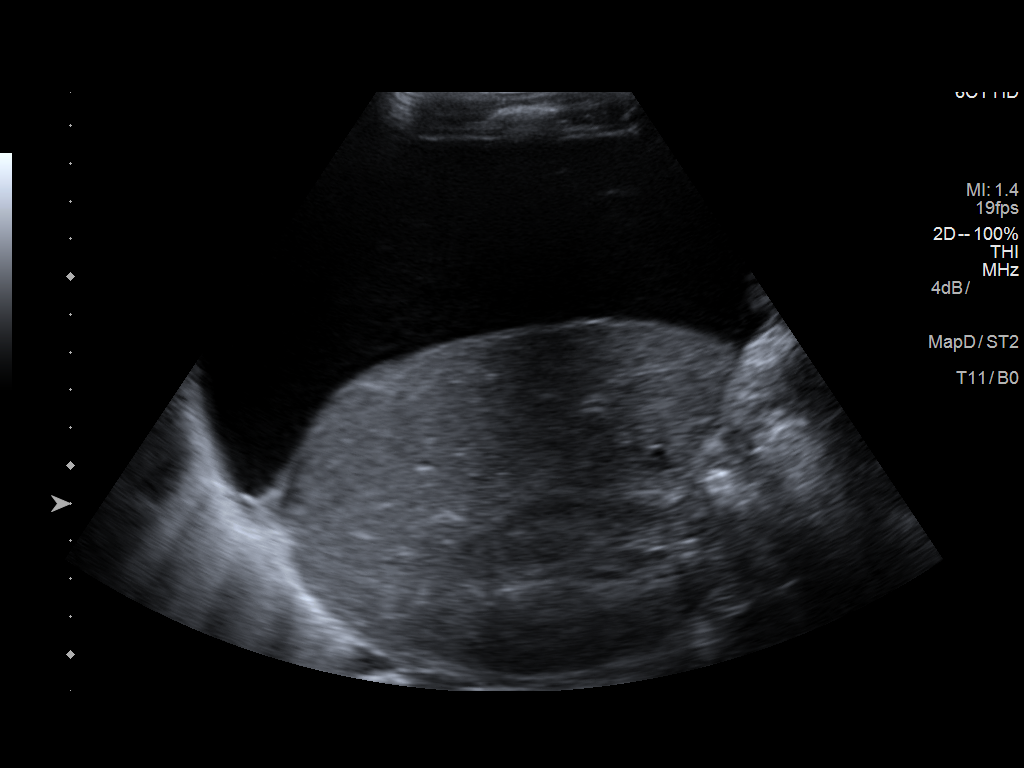
[im 2/6]
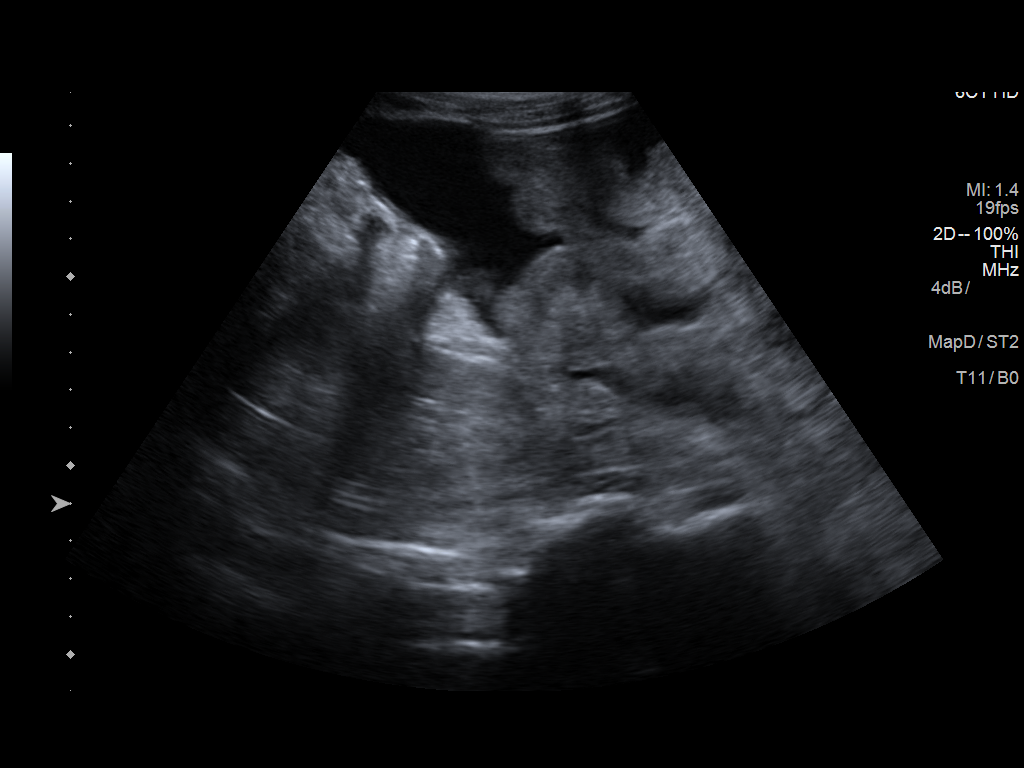
[im 3/6]
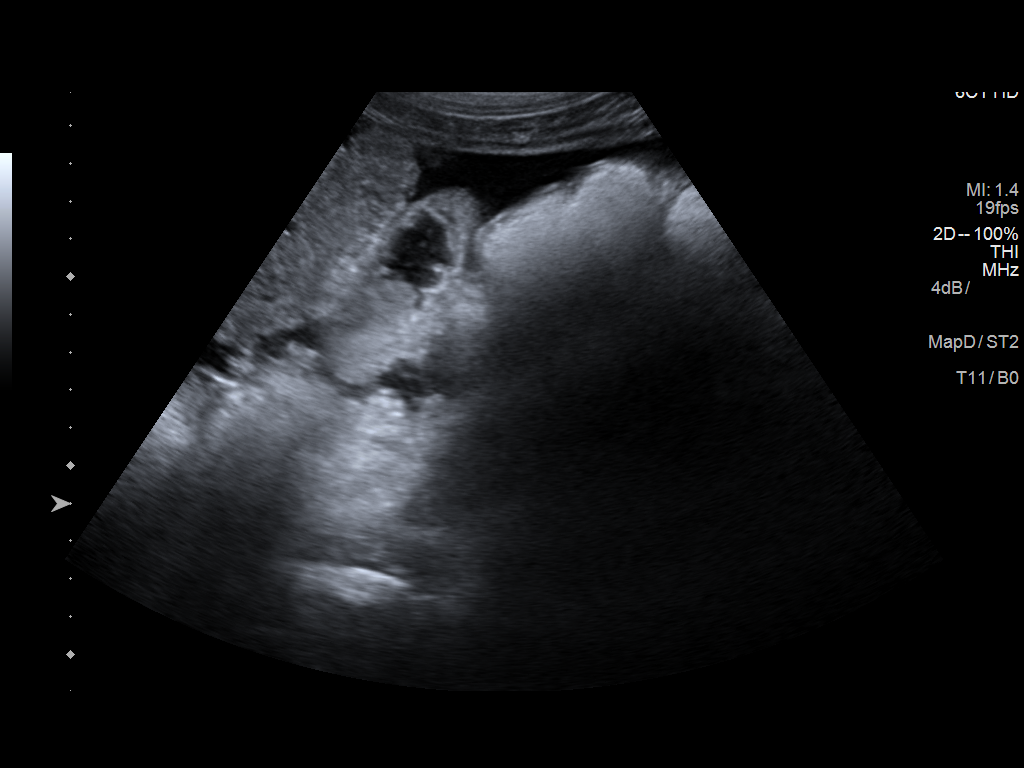
[im 4/6]
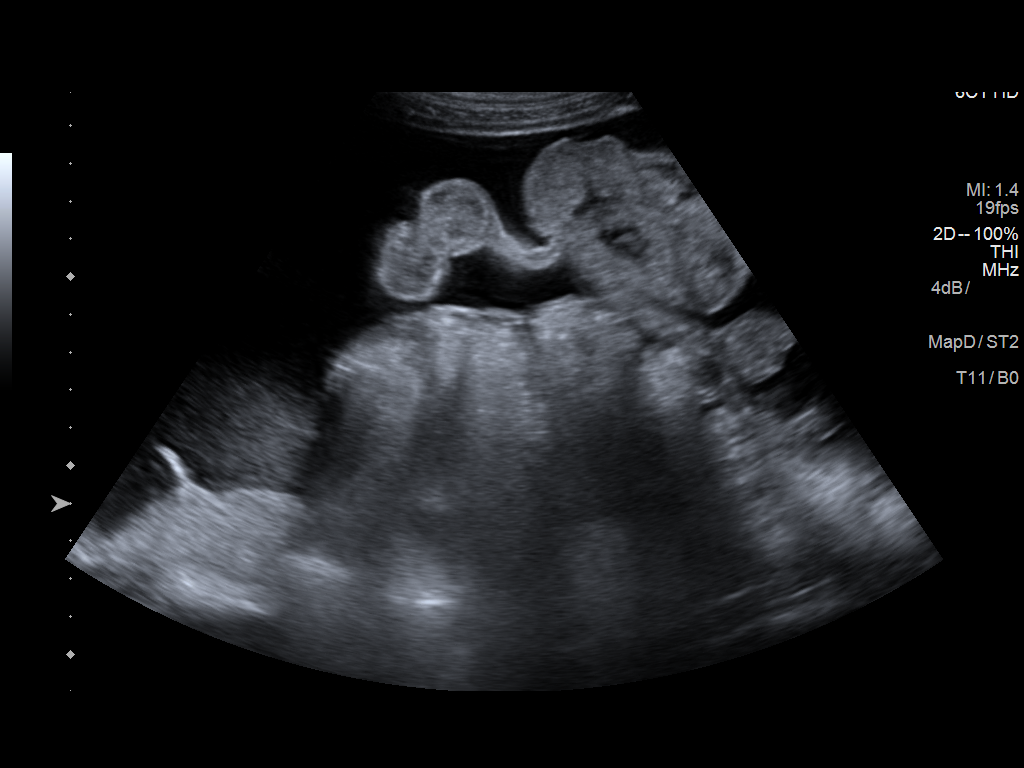
[im 5/6]
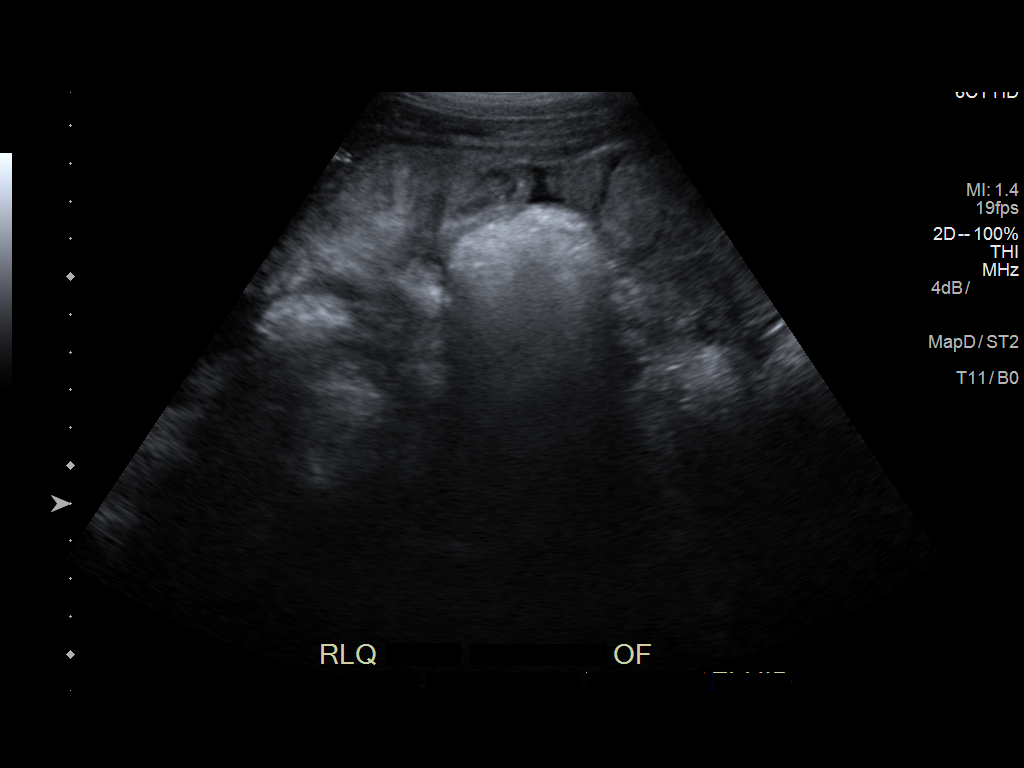
[im 6/6]
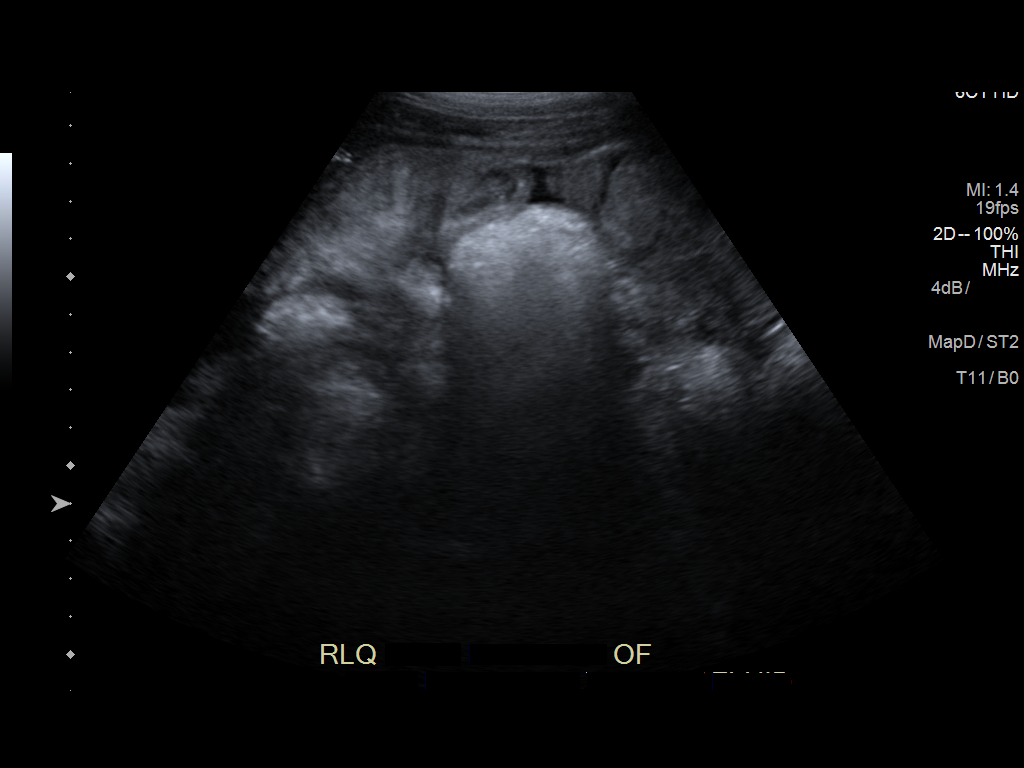

[6 of 6 positions shown; findings below may reference images not displayed]

EXAM:
ULTRASOUND GUIDED PARACENTESIS

MEDICATIONS:
None.

COMPLICATIONS:
None immediate.

PROCEDURE:
Informed written consent was obtained from the patient after a
discussion of the risks, benefits and alternatives to treatment. A
timeout was performed prior to the initiation of the procedure.

Initial ultrasound was performed to localize ascites. The right
lower abdomen was prepped and draped in the usual sterile fashion.
1% lidocaine with epinephrine was used for local anesthesia.

Following this, a 6 Fr Safe-T-Centesis catheter was introduced. An
ultrasound image was saved for documentation purposes. The
paracentesis was performed. The catheter was removed and a dressing
was applied. The patient tolerated the procedure well without
immediate post procedural complication.
FINDINGS: A total of approximately 2.7 L of clear, yellow fluid was removed. A
sample was sent for cytologic analysis.
IMPRESSION: Successful ultrasound-guided paracentesis yielding 2.7 liters of
peritoneal fluid.
